# Patient Record
Sex: Male | Born: 1985 | Race: Black or African American | Hispanic: No | Marital: Married | Smoking: Current every day smoker
Health system: Southern US, Community
[De-identification: ages and names within clinical notes are randomized; demographics above are authoritative.]

## PROBLEM LIST (undated history)

## (undated) DIAGNOSIS — Z91199 Patient's noncompliance with other medical treatment and regimen due to unspecified reason: Secondary | ICD-10-CM

## (undated) DIAGNOSIS — R109 Unspecified abdominal pain: Secondary | ICD-10-CM

## (undated) DIAGNOSIS — F319 Bipolar disorder, unspecified: Secondary | ICD-10-CM

## (undated) DIAGNOSIS — Z9119 Patient's noncompliance with other medical treatment and regimen: Secondary | ICD-10-CM

## (undated) DIAGNOSIS — F1910 Other psychoactive substance abuse, uncomplicated: Secondary | ICD-10-CM

## (undated) DIAGNOSIS — G8929 Other chronic pain: Secondary | ICD-10-CM

## (undated) DIAGNOSIS — R569 Unspecified convulsions: Secondary | ICD-10-CM

## (undated) HISTORY — DX: Bipolar disorder, unspecified: F31.9

## (undated) HISTORY — PX: ESOPHAGOGASTRODUODENOSCOPY: SHX1529

---
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
## 2006-11-06 ENCOUNTER — Emergency Department (HOSPITAL_COMMUNITY): Admission: EM | Admit: 2006-11-06 | Discharge: 2006-11-06 | Payer: Self-pay | Admitting: Emergency Medicine

## 2006-11-09 ENCOUNTER — Emergency Department (HOSPITAL_COMMUNITY): Admission: EM | Admit: 2006-11-09 | Discharge: 2006-11-10 | Payer: Self-pay | Admitting: Emergency Medicine

## 2009-11-12 ENCOUNTER — Emergency Department (HOSPITAL_COMMUNITY): Admission: EM | Admit: 2009-11-12 | Discharge: 2009-11-12 | Payer: Self-pay | Admitting: Emergency Medicine

## 2010-02-13 ENCOUNTER — Emergency Department (HOSPITAL_COMMUNITY): Admission: EM | Admit: 2010-02-13 | Discharge: 2010-02-13 | Payer: Self-pay | Admitting: Emergency Medicine

## 2010-03-23 ENCOUNTER — Emergency Department (HOSPITAL_COMMUNITY): Admission: EM | Admit: 2010-03-23 | Discharge: 2010-03-23 | Payer: Self-pay | Admitting: Emergency Medicine

## 2010-05-04 ENCOUNTER — Emergency Department (HOSPITAL_COMMUNITY): Admission: EM | Admit: 2010-05-04 | Discharge: 2010-05-04 | Payer: Self-pay | Admitting: Emergency Medicine

## 2010-05-08 ENCOUNTER — Emergency Department (HOSPITAL_COMMUNITY): Admission: EM | Admit: 2010-05-08 | Discharge: 2010-05-08 | Payer: Self-pay | Admitting: Emergency Medicine

## 2010-05-27 ENCOUNTER — Emergency Department (HOSPITAL_COMMUNITY): Admission: EM | Admit: 2010-05-27 | Discharge: 2010-05-27 | Payer: Self-pay | Admitting: Family Medicine

## 2010-05-28 ENCOUNTER — Emergency Department (HOSPITAL_COMMUNITY): Admission: EM | Admit: 2010-05-28 | Discharge: 2010-05-28 | Payer: Self-pay | Admitting: Emergency Medicine

## 2010-05-29 ENCOUNTER — Emergency Department (HOSPITAL_COMMUNITY): Admission: EM | Admit: 2010-05-29 | Discharge: 2010-05-29 | Payer: Self-pay | Admitting: Emergency Medicine

## 2010-05-30 ENCOUNTER — Emergency Department (HOSPITAL_COMMUNITY): Admission: EM | Admit: 2010-05-30 | Discharge: 2010-05-30 | Payer: Self-pay | Admitting: Emergency Medicine

## 2010-06-05 ENCOUNTER — Emergency Department (HOSPITAL_COMMUNITY): Admission: EM | Admit: 2010-06-05 | Discharge: 2010-06-05 | Payer: Self-pay | Admitting: Emergency Medicine

## 2010-06-07 ENCOUNTER — Inpatient Hospital Stay (HOSPITAL_COMMUNITY): Admission: EM | Admit: 2010-06-07 | Discharge: 2010-06-08 | Payer: Self-pay | Admitting: Emergency Medicine

## 2010-06-13 ENCOUNTER — Emergency Department (HOSPITAL_COMMUNITY): Admission: EM | Admit: 2010-06-13 | Discharge: 2010-06-13 | Payer: Self-pay | Admitting: Emergency Medicine

## 2010-06-16 ENCOUNTER — Emergency Department (HOSPITAL_COMMUNITY): Admission: EM | Admit: 2010-06-16 | Discharge: 2010-06-16 | Payer: Self-pay | Admitting: Emergency Medicine

## 2010-07-11 ENCOUNTER — Emergency Department (HOSPITAL_COMMUNITY): Admission: EM | Admit: 2010-07-11 | Discharge: 2010-07-11 | Payer: Self-pay | Admitting: Emergency Medicine

## 2010-12-11 LAB — CBC
HCT: 44.5 % (ref 39.0–52.0)
HCT: 46.1 % (ref 39.0–52.0)
HCT: 42.8 % (ref 39.0–52.0)
HCT: 45.7 % (ref 39.0–52.0)
Hemoglobin: 14.4 g/dL (ref 13.0–17.0)
Hemoglobin: 14.6 g/dL (ref 13.0–17.0)
Hemoglobin: 15.1 g/dL (ref 13.0–17.0)
Hemoglobin: 15.6 g/dL (ref 13.0–17.0)
Hemoglobin: 15.7 g/dL (ref 13.0–17.0)
Hemoglobin: 16.1 g/dL (ref 13.0–17.0)
MCH: 31.9 pg (ref 26.0–34.0)
MCH: 32 pg (ref 26.0–34.0)
MCH: 32.4 pg (ref 26.0–34.0)
MCH: 32.4 pg (ref 26.0–34.0)
MCHC: 33.8 g/dL (ref 30.0–36.0)
MCHC: 33.9 g/dL (ref 30.0–36.0)
MCHC: 33.9 g/dL (ref 30.0–36.0)
MCHC: 34.1 g/dL (ref 30.0–36.0)
MCHC: 34.3 g/dL (ref 30.0–36.0)
MCHC: 34.3 g/dL (ref 30.0–36.0)
MCV: 94.2 fL (ref 78.0–100.0)
MCV: 94.4 fL (ref 78.0–100.0)
MCV: 95.1 fL (ref 78.0–100.0)
Platelets: 338 10*3/uL (ref 150–400)
Platelets: 340 10*3/uL (ref 150–400)
Platelets: 435 10*3/uL — ABNORMAL HIGH (ref 150–400)
RBC: 4.72 MIL/uL (ref 4.22–5.81)
RBC: 4.74 MIL/uL (ref 4.22–5.81)
RDW: 13 % (ref 11.5–15.5)
RDW: 12.9 % (ref 11.5–15.5)
RDW: 13.5 % (ref 11.5–15.5)
RDW: 13.5 % (ref 11.5–15.5)
RDW: 13.6 % (ref 11.5–15.5)
WBC: 10.6 10*3/uL — ABNORMAL HIGH (ref 4.0–10.5)
WBC: 12.9 10*3/uL — ABNORMAL HIGH (ref 4.0–10.5)
WBC: 17.5 10*3/uL — ABNORMAL HIGH (ref 4.0–10.5)
WBC: 25.5 10*3/uL — ABNORMAL HIGH (ref 4.0–10.5)

## 2010-12-11 LAB — URINALYSIS, ROUTINE W REFLEX MICROSCOPIC
Glucose, UA: NEGATIVE mg/dL
Glucose, UA: NEGATIVE mg/dL
Ketones, ur: 40 mg/dL — AB
Nitrite: NEGATIVE
Nitrite: NEGATIVE
Nitrite: NEGATIVE
Protein, ur: NEGATIVE mg/dL
Protein, ur: 30 mg/dL — AB
Specific Gravity, Urine: 1.035 — ABNORMAL HIGH (ref 1.005–1.030)
Urobilinogen, UA: 1 mg/dL (ref 0.0–1.0)
Urobilinogen, UA: 1 mg/dL (ref 0.0–1.0)
pH: 6.5 (ref 5.0–8.0)

## 2010-12-11 LAB — DIFFERENTIAL
Basophils Absolute: 0 10*3/uL (ref 0.0–0.1)
Basophils Absolute: 0 10*3/uL (ref 0.0–0.1)
Basophils Absolute: 0.1 10*3/uL (ref 0.0–0.1)
Basophils Relative: 0 % (ref 0–1)
Basophils Relative: 1 % (ref 0–1)
Basophils Relative: 0 % (ref 0–1)
Eosinophils Absolute: 0 10*3/uL (ref 0.0–0.7)
Eosinophils Absolute: 0 10*3/uL (ref 0.0–0.7)
Eosinophils Absolute: 0 10*3/uL (ref 0.0–0.7)
Eosinophils Relative: 0 % (ref 0–5)
Eosinophils Relative: 0 % (ref 0–5)
Eosinophils Relative: 1 % (ref 0–5)
Lymphocytes Relative: 12 % (ref 12–46)
Lymphocytes Relative: 19 % (ref 12–46)
Lymphocytes Relative: 4 % — ABNORMAL LOW (ref 12–46)
Lymphs Abs: 1.1 10*3/uL (ref 0.7–4.0)
Lymphs Abs: 1.2 10*3/uL (ref 0.7–4.0)
Lymphs Abs: 1.5 10*3/uL (ref 0.7–4.0)
Lymphs Abs: 2.2 10*3/uL (ref 0.7–4.0)
Monocytes Absolute: 0.6 10*3/uL (ref 0.1–1.0)
Monocytes Absolute: 0.6 10*3/uL (ref 0.1–1.0)
Monocytes Relative: 5 % (ref 3–12)
Monocytes Relative: 6 % (ref 3–12)
Monocytes Relative: 6 % (ref 3–12)
Monocytes Relative: 7 % (ref 3–12)
Neutro Abs: 23 10*3/uL — ABNORMAL HIGH (ref 1.7–7.7)
Neutro Abs: 8.1 10*3/uL — ABNORMAL HIGH (ref 1.7–7.7)
Neutro Abs: 8.3 10*3/uL — ABNORMAL HIGH (ref 1.7–7.7)
Neutro Abs: 8.9 10*3/uL — ABNORMAL HIGH (ref 1.7–7.7)
Neutro Abs: 9.7 10*3/uL — ABNORMAL HIGH (ref 1.7–7.7)
Neutrophils Relative %: 72 % (ref 43–77)
Neutrophils Relative %: 82 % — ABNORMAL HIGH (ref 43–77)
Neutrophils Relative %: 82 % — ABNORMAL HIGH (ref 43–77)
Neutrophils Relative %: 85 % — ABNORMAL HIGH (ref 43–77)
Neutrophils Relative %: 90 % — ABNORMAL HIGH (ref 43–77)

## 2010-12-11 LAB — POCT I-STAT, CHEM 8
BUN: 5 mg/dL — ABNORMAL LOW (ref 6–23)
Chloride: 97 mEq/L (ref 96–112)
Creatinine, Ser: 0.9 mg/dL (ref 0.4–1.5)
Glucose, Bld: 109 mg/dL — ABNORMAL HIGH (ref 70–99)
HCT: 52 % (ref 39.0–52.0)
Potassium: 3.3 mEq/L — ABNORMAL LOW (ref 3.5–5.1)
Potassium: 3.4 mEq/L — ABNORMAL LOW (ref 3.5–5.1)
Sodium: 143 mEq/L (ref 135–145)
Sodium: 139 mEq/L (ref 135–145)
TCO2: 30 mmol/L (ref 0–100)

## 2010-12-11 LAB — HEPATIC FUNCTION PANEL
Albumin: 3.7 g/dL (ref 3.5–5.2)
Alkaline Phosphatase: 64 U/L (ref 39–117)
Indirect Bilirubin: 0.5 mg/dL (ref 0.3–0.9)
Total Protein: 7.6 g/dL (ref 6.0–8.3)

## 2010-12-11 LAB — COMPREHENSIVE METABOLIC PANEL
ALT: 41 U/L (ref 0–53)
Albumin: 3.8 g/dL (ref 3.5–5.2)
Alkaline Phosphatase: 57 U/L (ref 39–117)
BUN: 7 mg/dL (ref 6–23)
BUN: 6 mg/dL (ref 6–23)
CO2: 27 mEq/L (ref 19–32)
Calcium: 9.7 mg/dL (ref 8.4–10.5)
Calcium: 9.2 mg/dL (ref 8.4–10.5)
Creatinine, Ser: 1.02 mg/dL (ref 0.4–1.5)
GFR calc Af Amer: >60 mL/min (ref >60)
GFR calc non Af Amer: >60 mL/min (ref >60)
GFR calc non Af Amer: >60 mL/min (ref >60)
Glucose, Bld: 100 mg/dL — ABNORMAL HIGH (ref 70–99)
Glucose, Bld: 103 mg/dL — ABNORMAL HIGH (ref 70–99)
Sodium: 141 mEq/L (ref 135–145)
Total Protein: 7.7 g/dL (ref 6.0–8.3)
Total Protein: 7.4 g/dL (ref 6.0–8.3)

## 2010-12-11 LAB — LIPASE, BLOOD
Lipase: 17 U/L (ref 11–59)
Lipase: 21 U/L (ref 11–59)

## 2010-12-11 LAB — RAPID URINE DRUG SCREEN, HOSP PERFORMED
Barbiturates: NOT DETECTED
Benzodiazepines: NOT DETECTED
Cocaine: NOT DETECTED
Opiates: NOT DETECTED

## 2010-12-11 LAB — GLUCOSE, CAPILLARY
Glucose-Capillary: 128 mg/dL — ABNORMAL HIGH (ref 70–99)
Glucose-Capillary: 106 mg/dL — ABNORMAL HIGH (ref 70–99)

## 2010-12-11 LAB — T4, FREE: Free T4: 1.23 ng/dL (ref 0.80–1.80)

## 2010-12-11 LAB — BASIC METABOLIC PANEL
Calcium: 8.9 mg/dL (ref 8.4–10.5)
Calcium: 9.4 mg/dL (ref 8.4–10.5)
Creatinine, Ser: 0.95 mg/dL (ref 0.4–1.5)
GFR calc Af Amer: >60 mL/min (ref >60)
GFR calc Af Amer: >60 mL/min (ref >60)
GFR calc non Af Amer: >60 mL/min (ref >60)
GFR calc non Af Amer: >60 mL/min (ref >60)
Glucose, Bld: 88 mg/dL (ref 70–99)
Sodium: 139 mEq/L (ref 135–145)
Sodium: 140 mEq/L (ref 135–145)

## 2010-12-11 LAB — MAGNESIUM: Magnesium: 1.8 mg/dL (ref 1.5–2.5)

## 2010-12-11 LAB — PHENYTOIN LEVEL, TOTAL
Phenytoin Lvl: 15.8 ug/mL (ref 10.0–20.0)
Phenytoin Lvl: <2.5 ug/mL — ABNORMAL LOW (ref 10.0–20.0)

## 2010-12-11 LAB — CULTURE, BLOOD (ROUTINE X 2)
Culture: NO GROWTH
Culture: NO GROWTH

## 2010-12-11 LAB — URINE MICROSCOPIC-ADD ON

## 2010-12-11 LAB — URINE CULTURE: Culture  Setup Time: 201109102021

## 2010-12-12 LAB — DIFFERENTIAL
Basophils Relative: 1 % (ref 0–1)
Monocytes Absolute: 1.3 10*3/uL — ABNORMAL HIGH (ref 0.1–1.0)
Neutro Abs: 9 10*3/uL — ABNORMAL HIGH (ref 1.7–7.7)
Neutrophils Relative %: 72 % (ref 43–77)

## 2010-12-12 LAB — CBC
HCT: 49.6 % (ref 39.0–52.0)
MCV: 93.1 fL (ref 78.0–100.0)
RBC: 5.33 MIL/uL (ref 4.22–5.81)
WBC: 12.5 10*3/uL — ABNORMAL HIGH (ref 4.0–10.5)

## 2010-12-12 LAB — URINALYSIS, ROUTINE W REFLEX MICROSCOPIC
Glucose, UA: NEGATIVE mg/dL
Protein, ur: NEGATIVE mg/dL
Specific Gravity, Urine: 1.026 (ref 1.005–1.030)
Urobilinogen, UA: 4 mg/dL — ABNORMAL HIGH (ref 0.0–1.0)

## 2010-12-12 LAB — URINE MICROSCOPIC-ADD ON

## 2010-12-12 LAB — POCT I-STAT, CHEM 8
BUN: 4 mg/dL — ABNORMAL LOW (ref 6–23)
Calcium, Ion: 1.16 mmol/L (ref 1.12–1.32)
Chloride: 106 mEq/L (ref 96–112)
HCT: 50 % (ref 39.0–52.0)
Hemoglobin: 17 g/dL (ref 13.0–17.0)
Sodium: 141 mEq/L (ref 135–145)
Sodium: 145 mEq/L (ref 135–145)
TCO2: 25 mmol/L (ref 0–100)

## 2010-12-12 LAB — COMPREHENSIVE METABOLIC PANEL
Alkaline Phosphatase: 74 U/L (ref 39–117)
BUN: 5 mg/dL — ABNORMAL LOW (ref 6–23)
Chloride: 106 mEq/L (ref 96–112)
Glucose, Bld: 82 mg/dL (ref 70–99)
Potassium: 3.7 mEq/L (ref 3.5–5.1)
Total Bilirubin: 0.7 mg/dL (ref 0.3–1.2)

## 2010-12-12 LAB — RAPID URINE DRUG SCREEN, HOSP PERFORMED
Amphetamines: NOT DETECTED
Cocaine: NOT DETECTED
Opiates: NOT DETECTED
Opiates: NOT DETECTED
Tetrahydrocannabinol: POSITIVE — AB
Tetrahydrocannabinol: POSITIVE — AB

## 2010-12-12 LAB — PHENYTOIN LEVEL, TOTAL: Phenytoin Lvl: 6 ug/mL — ABNORMAL LOW (ref 10.0–20.0)

## 2010-12-14 LAB — BASIC METABOLIC PANEL
Calcium: 9 mg/dL (ref 8.4–10.5)
Creatinine, Ser: 0.92 mg/dL (ref 0.4–1.5)
GFR calc Af Amer: >60 mL/min (ref >60)
GFR calc non Af Amer: >60 mL/min (ref >60)

## 2010-12-14 LAB — GLUCOSE, CAPILLARY: Glucose-Capillary: 115 mg/dL — ABNORMAL HIGH (ref 70–99)

## 2010-12-14 LAB — PHENYTOIN LEVEL, TOTAL: Phenytoin Lvl: <2.5 ug/mL — ABNORMAL LOW (ref 10.0–20.0)

## 2010-12-15 LAB — CBC
HCT: 45 % (ref 39.0–52.0)
MCHC: 33.3 g/dL (ref 30.0–36.0)
MCV: 96.7 fL (ref 78.0–100.0)
Platelets: 234 10*3/uL (ref 150–400)
RDW: 13.2 % (ref 11.5–15.5)
WBC: 7.8 10*3/uL (ref 4.0–10.5)

## 2010-12-15 LAB — COMPREHENSIVE METABOLIC PANEL
AST: 23 U/L (ref 0–37)
CO2: 24 mEq/L (ref 19–32)
Calcium: 9 mg/dL (ref 8.4–10.5)
Creatinine, Ser: 1.05 mg/dL (ref 0.4–1.5)
GFR calc Af Amer: >60 mL/min (ref >60)
GFR calc non Af Amer: >60 mL/min (ref >60)

## 2010-12-15 LAB — DIFFERENTIAL
Eosinophils Absolute: 0.4 10*3/uL (ref 0.0–0.7)
Eosinophils Relative: 6 % — ABNORMAL HIGH (ref 0–5)
Lymphs Abs: 2.6 10*3/uL (ref 0.7–4.0)

## 2010-12-17 LAB — BASIC METABOLIC PANEL
BUN: 9 mg/dL (ref 6–23)
GFR calc Af Amer: >60 mL/min (ref >60)
GFR calc non Af Amer: >60 mL/min (ref >60)
Potassium: 3.2 mEq/L — ABNORMAL LOW (ref 3.5–5.1)
Sodium: 139 mEq/L (ref 135–145)

## 2010-12-17 LAB — PHENYTOIN LEVEL, TOTAL: Phenytoin Lvl: <2.5 ug/mL — ABNORMAL LOW (ref 10.0–20.0)

## 2011-02-13 NOTE — Consult Note (Signed)
NAME:  Greg Morales NO.:  [id number]   MEDICAL RECORD NO.:  [id number]          PATIENT TYPE:  EMS   LOCATION:  [facility]                         FACILITY:  [facility]   PHYSICIAN:  Marlan Palau, M.D.  DATE OF BIRTH:  Mar 22, 1986   DATE OF CONSULTATION:  DATE OF DISCHARGE:                                 CONSULTATION   This is a neurology emergency room consult note on this patient done on  November 10, 2006.   HISTORY OF PRESENT ILLNESS:  Greg Morales is a 25 year old right-handed  black male born [birth year] with a history of seizures order since  childhood.  This patient has been on phenobarbital and Dilantin but  really was not taking these medications on a regular basis until he went  to jail about a year ago.  The patient has been on these medications on  a regular basis.  The patient was recently seen through the [facility]  emergency room on November 06, 2006.  The patient was felt to have had a  seizure around that time, and Dilantin and phenobarbital levels were  both within therapeutic range at that time.  Drug screen was positive  only for barbiturates.  Dilantin level was 14; phenobarbital level was  29.7.  The patient is on 300 mg of Dilantin in the evening and takes  phenobarbital 60 mg twice daily and 120 mg at night.  This evening, the  patient was noted to have some sort of a spell of falling out or loss of  consciousness.  Following the event, the patient was felt to possibly  have left-sided weakness and speech disturbance.  The patient was  brought to the emergency room for further evaluation.  The CT scan of  the brain done was unremarkable.  Speech pattern appears to be non-  organic with a monosyllabic hesitant speech type pattern.  Neurology was  asked see this patient as a code stroke.   PAST MEDICAL HISTORY:  Significant for:  1. History of syncope with non-organic speech pattern.  2. History of seizures on phenobarbital and  Dilantin.  3. History of hypothyroidism on medications.   The patient's current medications include:  1. Dilantin 300 mg every night.  2. Phenobarbital 60 mg b.i.d., 120 mg at night.  3. Synthroid 0.025 mg daily.   The patient smokes a pack of cigarettes a day.  Does not drink alcohol  currently but did drink prior to his incarceration.  Does use marijuana  prior to incarceration.   SOCIAL HISTORY:  The patient lives in the [place]  area.  Currently is incarcerated in jail for over a year.  Is not  married, has 2 children.  Had been working in temporary worked prior to  incarceration.   FAMILY MEDICAL HISTORY:  Notable that father is alive and mother is  alive, both in good health.  She has three siblings, some with asthma.  Maternal grandmother has history of seizures.  Otherwise no other family  members with seizures.   REVIEW OF SYSTEMS:  Notable for no recent fevers,  chills.  The patient  does note a headache.  Denies neck pain, chest pain, abdominal pain,  shortness of breath, cough.  Does note that the left foot is somewhat  numb, feels weak all over.   PHYSICAL EXAMINATION:  VITALS:  Blood pressure is 124/93, heart rate 81,  respiratory 23, temperature 100.1.  In general, this patient is a well-developed black male who is alert,  cooperative at the time examination.  HEENT EXAMINATION:  Is atraumatic.  EYES:  Pupils equal, round and  reactive to light.  Disks flat bilaterally. TONGUE:  Was without  lacerations.  NECK:  Supple.  No carotid bruits noted.  RESPIRATORY EXAMINATION:  Is clear.  CARDIOVASCULAR EXAMINATION:  Reveals a regular rate and rhythm.  No  obvious murmurs or rubs noted.  EXTREMITIES:  Without significant edema.  NEUROLOGIC EXAMINATION:  Cranial nerves as above.  Facial symmetry is  present.  Patient has good sensation in the face to pinprick, soft touch  bilaterally.  Has good strength in facial muscles, muscles in head  turning  and shoulder shrug bilaterally.  Speech is somewhat hesitant,  monosyllabic in nature, not true aphasia.  Motor testing reveals good  strength in all fours.  Good symmetric motor is noted throughout.  Sensory testing reveals some decrease in pinprick sensation on left  foot, decreased vibratory sensation of the left foot; otherwise  pinprick, soft touch, vibratory sensation were intact in all fours.  The  patient has good finger-nose-finger, toe-to-finger bilaterally.  Gait  was not tested.  Deep tendon reflexes again were symmetric and normal.  Toes neutral bilaterally.   LABORATORY VALUES:  At this time are pending.   CT of the head is as above.   IMPRESSION:  History of seizures disorder with apparent recurrence on  November 06, 2006.   The patient presents at this point as a code stroke with reported left-  sided weakness and speech disturbance.  No left-sided weakness is seen  at this point.  Speech disturbance appears to be non-organic in nature.  The patient had a questionable event of loss of consciousness this  evening.  The actual description of the event is not available to me.  I  suppose it is possibly he could have had a seizure event, but three days  ago, both phenobarbital and Dilantin levels were well within therapeutic  range.  Currently, the patient is demonstrating non-organic speech  behavior.  I do not suspect a stroke in this case.   PLAN:  Would check Dilantin and phenobarbital levels.  If these are well  within therapeutic range as they were three days ago, the patient may be  returned back to jail this evening.      Marlan Palau, M.D.  Electronically Signed     CKW/MEDQ  D:  11/10/2006  T:  11/10/2006  Job:  [id number]   cc:   [facility]

## 2011-04-01 IMAGING — CR DG ABDOMEN 2V
3 series · 3 of 3 positions shown · non-contrast
Comparison: None

CLINICAL DATA: Nausea, vomiting

ABDOMEN - 2 VIEW

[w abdomen decub * (1 of 2)]
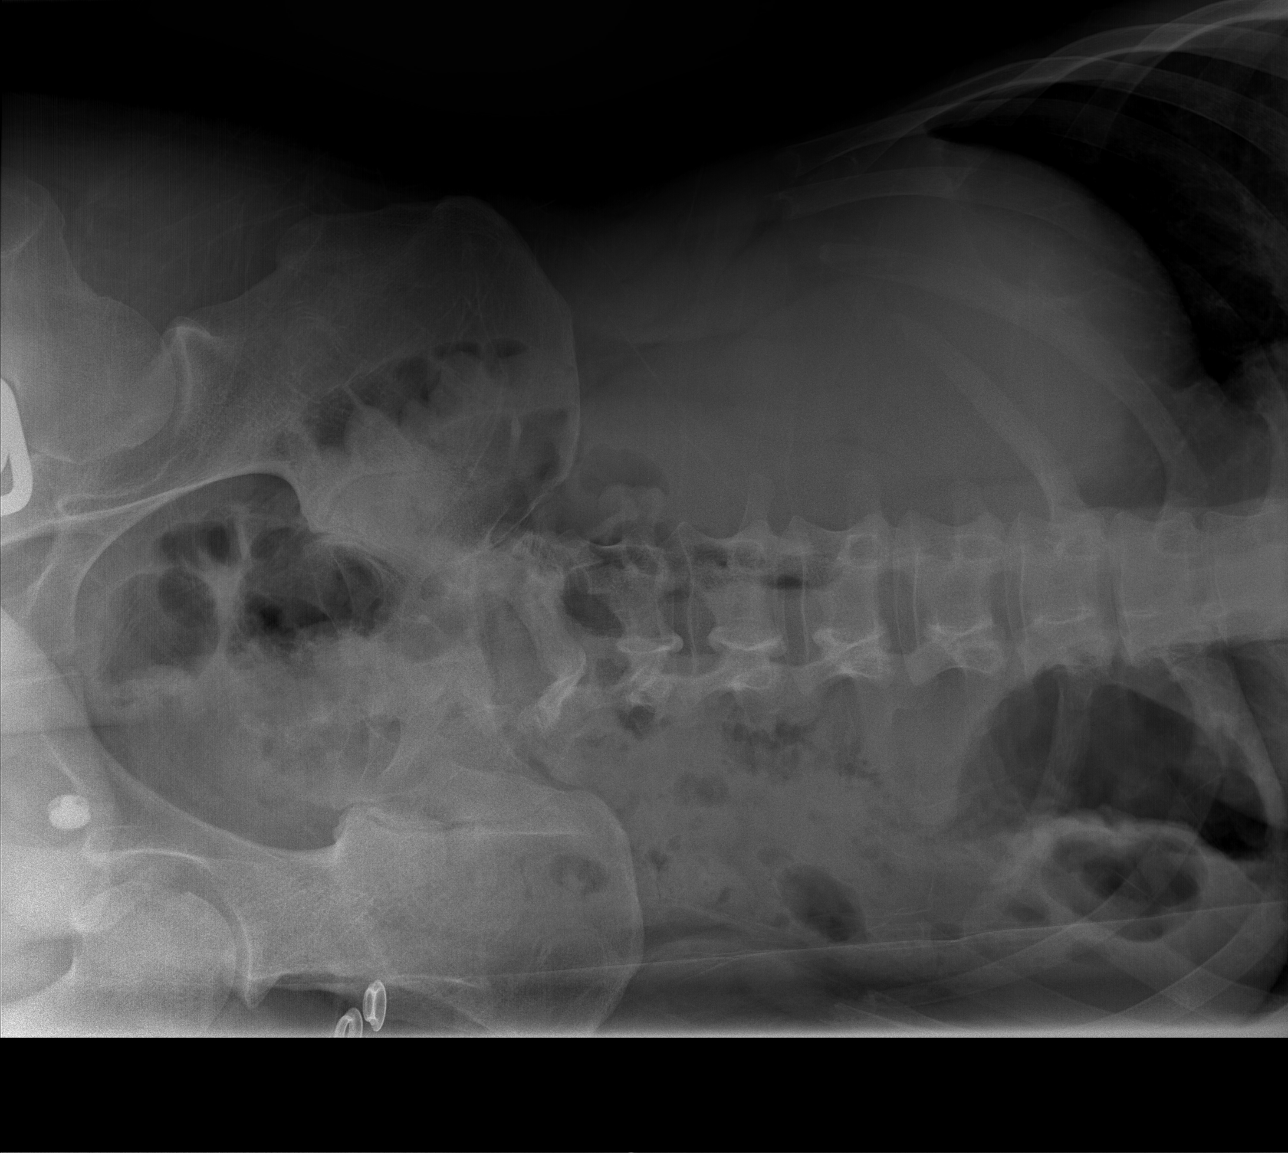

[w abdomen decub * (2 of 2)]
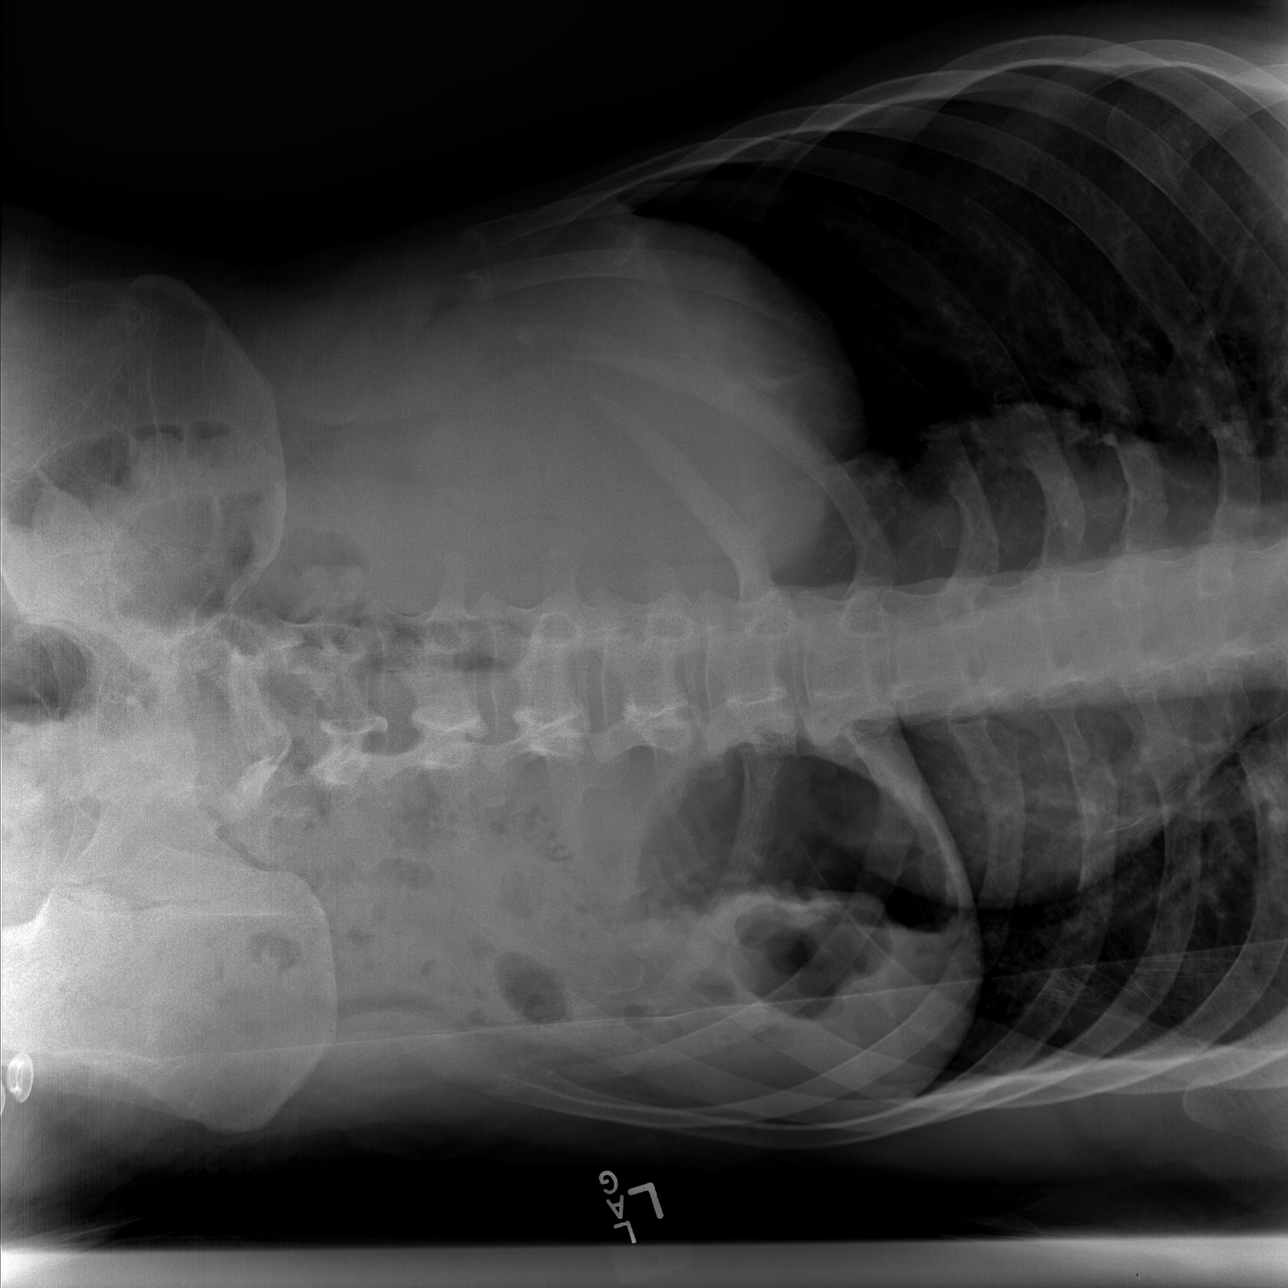

[t abdomen supine]
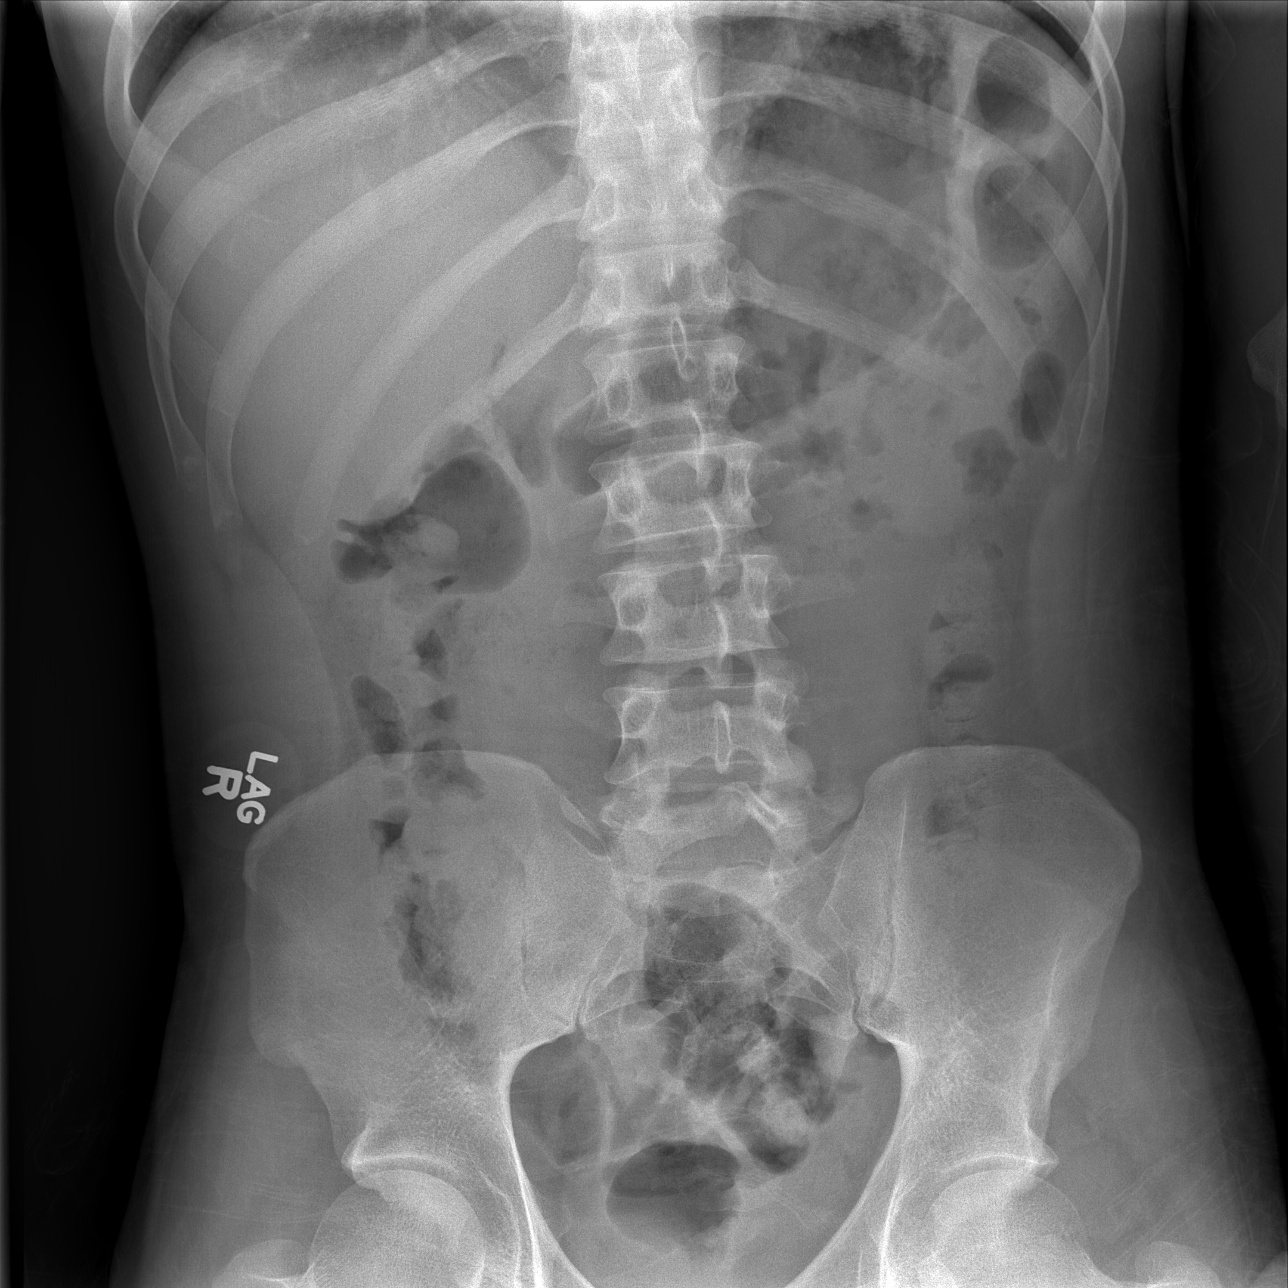

[3 of 3 positions shown; findings below may reference images not displayed]

FINDINGS: Normal bowel gas pattern.
No bowel dilatation or bowel wall thickening.
Bones unremarkable.
No urinary tract calcification or free intraperitoneal air.
IMPRESSION: Normal exam.

## 2011-04-29 IMAGING — US US SCROTUM
1 series · 14 of 25 positions shown · non-contrast
Comparison: None.

CLINICAL DATA: Scrotal pain and swelling - left greater than right

SCROTAL ULTRASOUND
DOPPLER ULTRASOUND OF THE TESTICLES
TECHNIQUE: Complete ultrasound examination of the testicles,
epididymis, and other scrotal structures was performed.  Color and
spectral Doppler ultrasound were also utilized to evaluate blood
flow to the testicles.

[Series 1: us scrotum · 0.08mm/px · 14 of 41 slices shown]
[im 1/41]
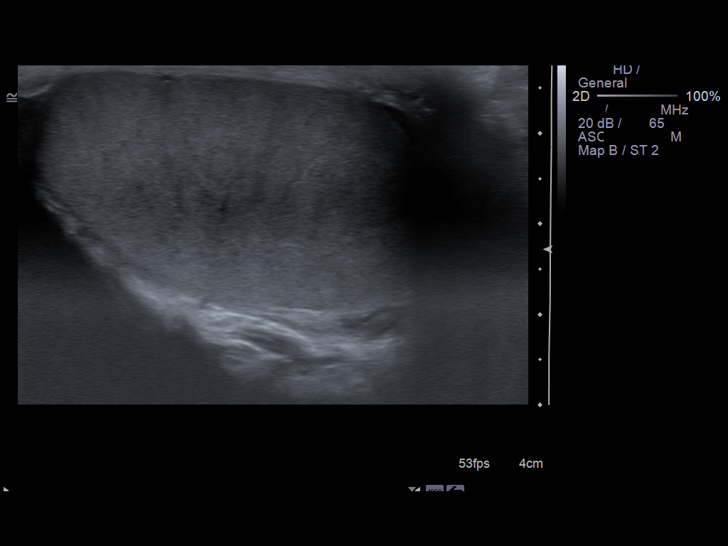
[im 4/41]
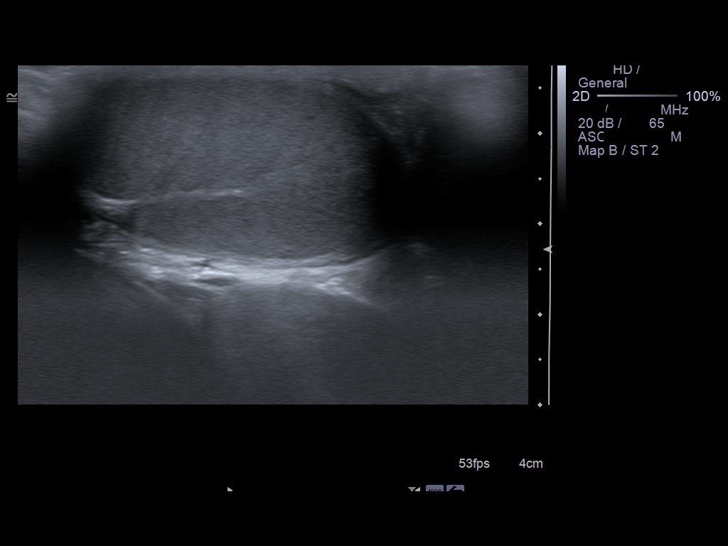
[im 7/41]
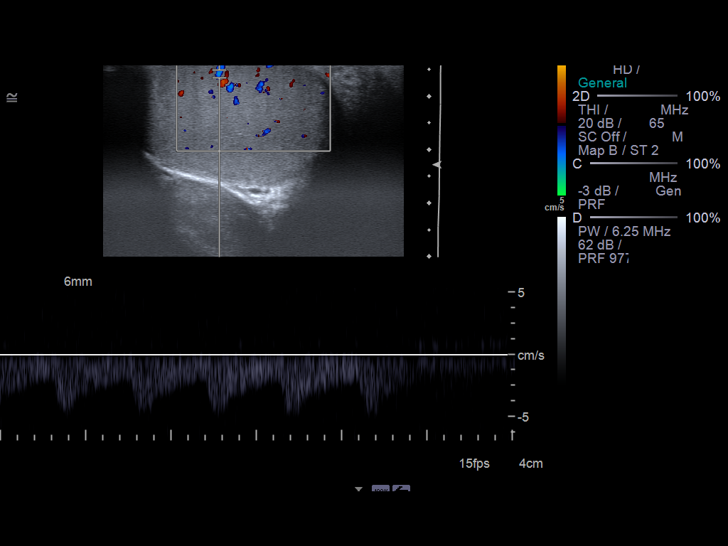
[im 11/41]
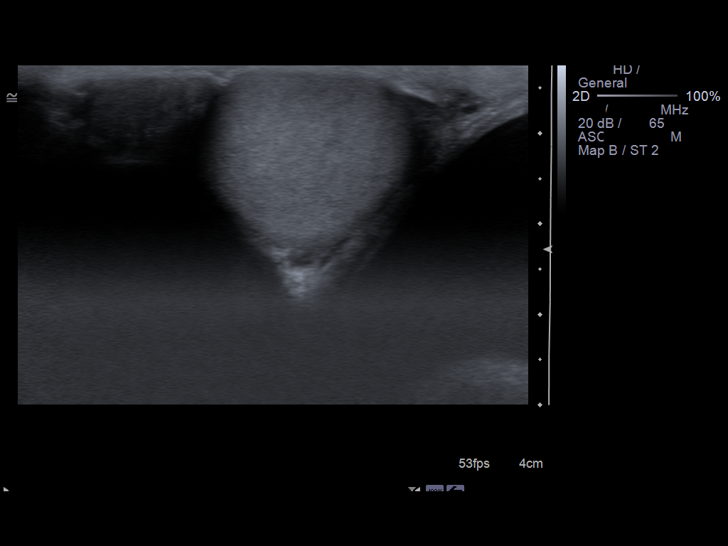
[im 14/41]
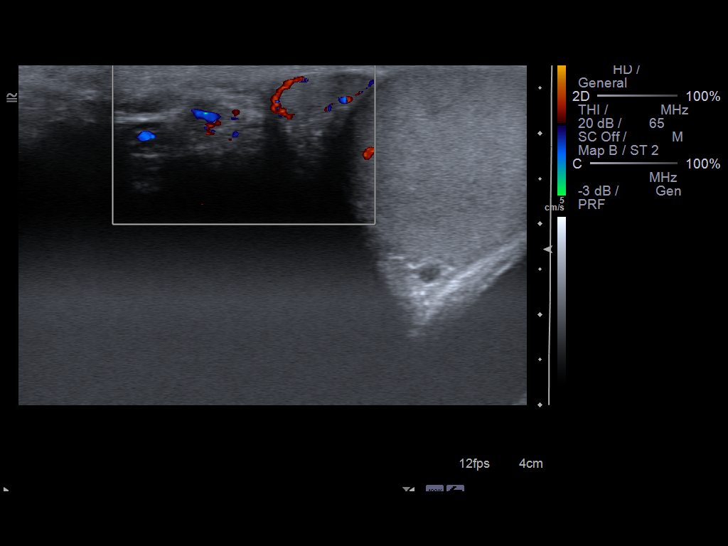
[im 16/41]
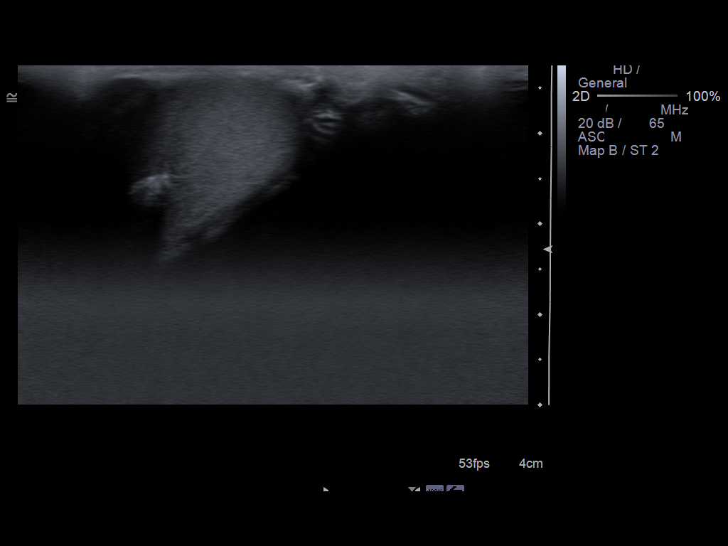
[im 19/41]
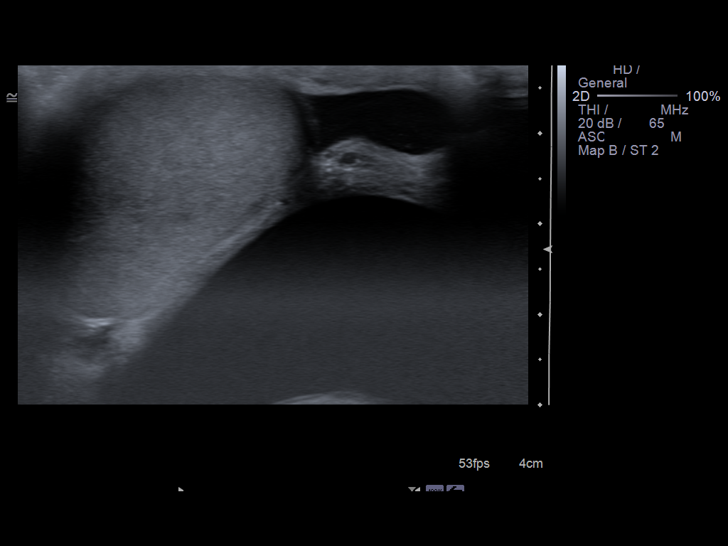
[im 22/41]
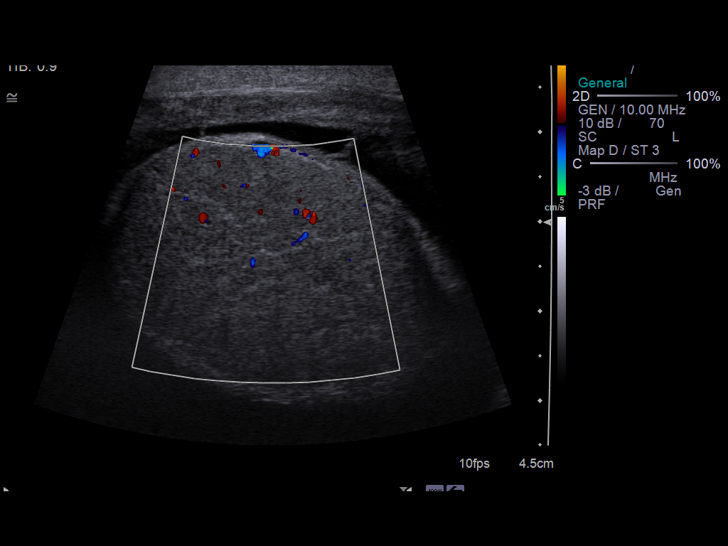
[im 26/41]
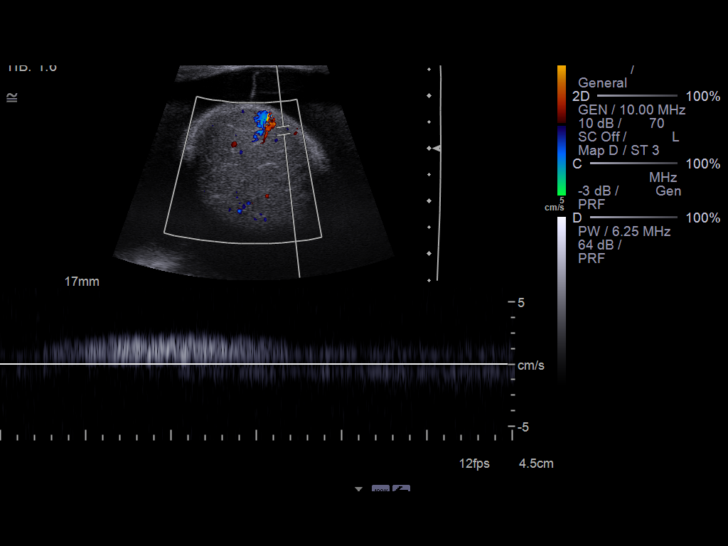
[im 27/41]
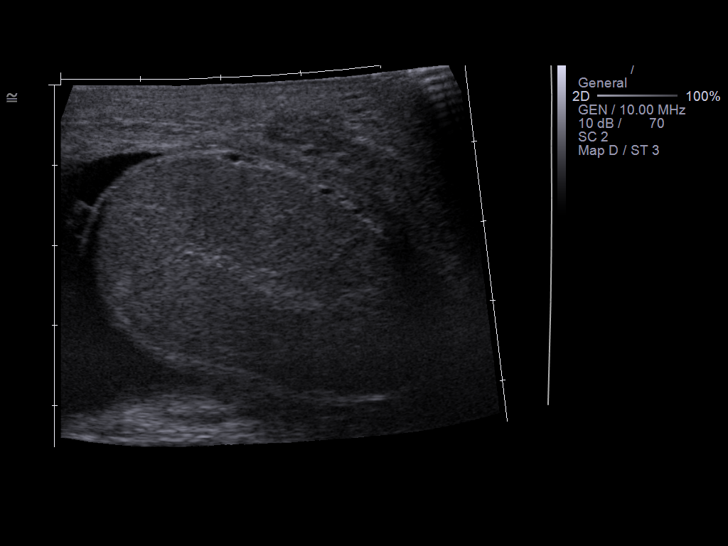
[im 31/41]
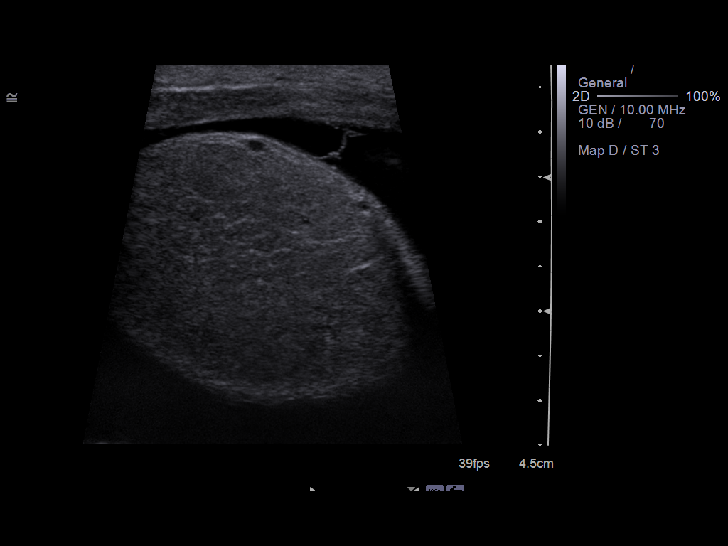
[im 34/41]
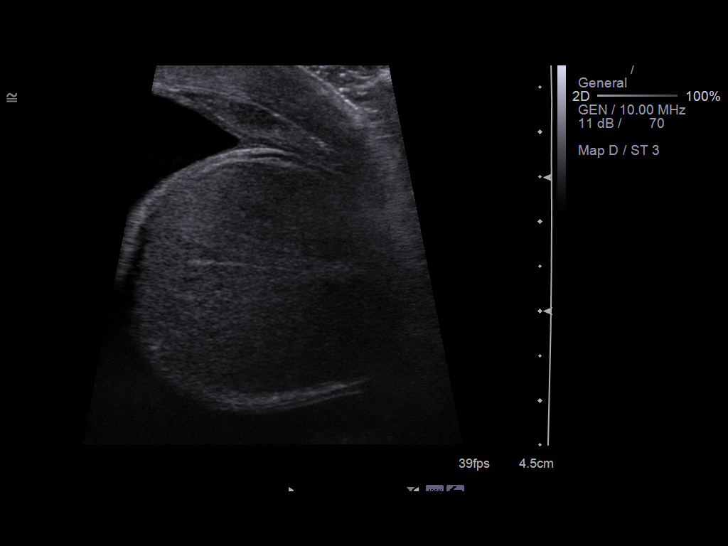
[im 37/41]
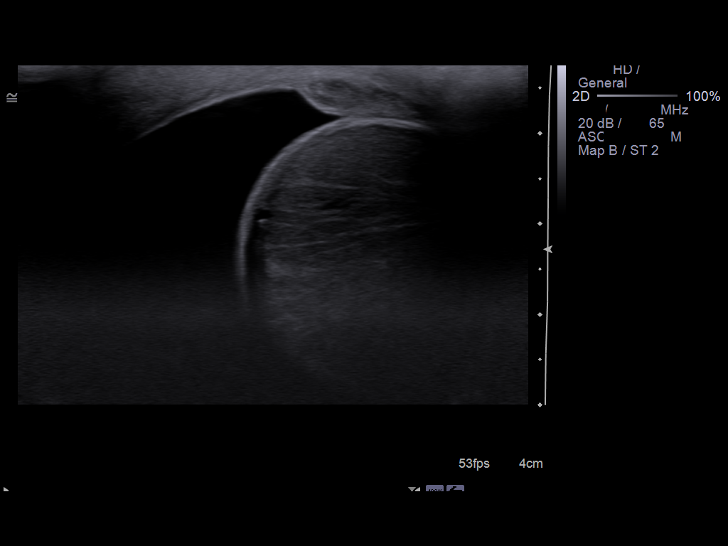
[im 41/41]
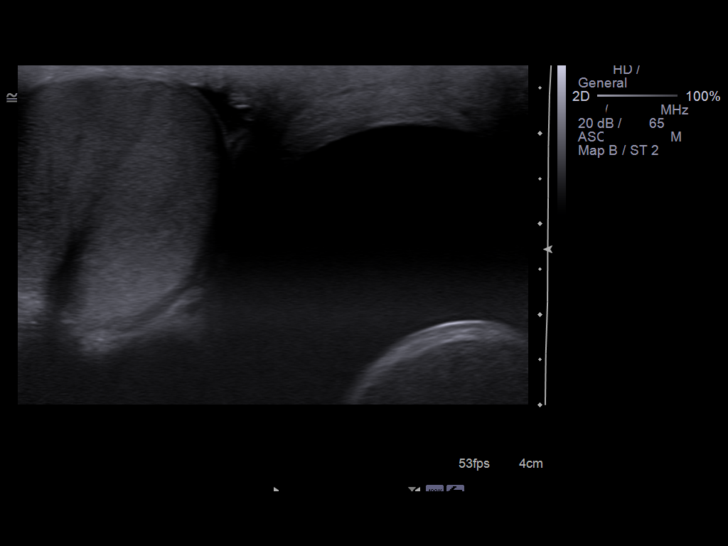

[14 of 25 positions shown; findings below may reference images not displayed]

FINDINGS: Both testicles are normal in size and contour without
lesions.  Symmetrical color flow Doppler and normal arterial and
venous wave [place] is noted.

No evidence for epididymitis.  There is a large hydrocele on the
left.  The  fluid appears uncomplicated.  No evidence for
varicocele on the right.  The large amount of left scrotal fluid
would not allow assessment for left varicocele.
IMPRESSION: 1.  Both testicles normal.
2.  No evidence for epididymitis.
3.  Large left hydrocele.

## 2011-12-23 ENCOUNTER — Emergency Department (HOSPITAL_COMMUNITY)
Admission: EM | Admit: 2011-12-23 | Discharge: 2011-12-23 | Disposition: A | Discharge status: Home / Self Care | Payer: Medicaid - Out of State | Attending: Emergency Medicine | Admitting: Emergency Medicine

## 2011-12-23 ENCOUNTER — Encounter (HOSPITAL_COMMUNITY): Payer: Self-pay | Admitting: Emergency Medicine

## 2011-12-23 DIAGNOSIS — R10819 Abdominal tenderness, unspecified site: Secondary | ICD-10-CM | POA: Insufficient documentation

## 2011-12-23 DIAGNOSIS — R61 Generalized hyperhidrosis: Secondary | ICD-10-CM | POA: Insufficient documentation

## 2011-12-23 DIAGNOSIS — R5381 Other malaise: Secondary | ICD-10-CM | POA: Insufficient documentation

## 2011-12-23 DIAGNOSIS — IMO0002 Reserved for concepts with insufficient information to code with codable children: Secondary | ICD-10-CM | POA: Insufficient documentation

## 2011-12-23 DIAGNOSIS — R569 Unspecified convulsions: Secondary | ICD-10-CM | POA: Insufficient documentation

## 2011-12-23 DIAGNOSIS — R259 Unspecified abnormal involuntary movements: Secondary | ICD-10-CM | POA: Insufficient documentation

## 2011-12-23 DIAGNOSIS — R112 Nausea with vomiting, unspecified: Secondary | ICD-10-CM | POA: Insufficient documentation

## 2011-12-23 DIAGNOSIS — R5383 Other fatigue: Secondary | ICD-10-CM | POA: Insufficient documentation

## 2011-12-23 DIAGNOSIS — R109 Unspecified abdominal pain: Secondary | ICD-10-CM | POA: Insufficient documentation

## 2011-12-23 HISTORY — DX: Unspecified convulsions: R56.9

## 2011-12-23 LAB — DIFFERENTIAL
Eosinophils Absolute: 0 10*3/uL (ref 0.0–0.7)
Eosinophils Relative: 0 % (ref 0–5)
Lymphocytes Relative: 8 % — ABNORMAL LOW (ref 12–46)
Lymphs Abs: 0.8 10*3/uL (ref 0.7–4.0)
Monocytes Absolute: 0.3 10*3/uL (ref 0.1–1.0)

## 2011-12-23 LAB — COMPREHENSIVE METABOLIC PANEL
CO2: 24 mEq/L (ref 19–32)
Calcium: 9.2 mg/dL (ref 8.4–10.5)
Creatinine, Ser: 0.94 mg/dL (ref 0.50–1.35)
GFR calc Af Amer: >90 mL/min (ref >90)
GFR calc non Af Amer: >90 mL/min (ref >90)
Glucose, Bld: 116 mg/dL — ABNORMAL HIGH (ref 70–99)
Sodium: 140 mEq/L (ref 135–145)
Total Protein: 7.1 g/dL (ref 6.0–8.3)

## 2011-12-23 LAB — RAPID URINE DRUG SCREEN, HOSP PERFORMED
Benzodiazepines: NOT DETECTED
Cocaine: NOT DETECTED
Opiates: NOT DETECTED

## 2011-12-23 LAB — CBC
Hemoglobin: 14.3 g/dL (ref 13.0–17.0)
MCHC: 33.4 g/dL (ref 30.0–36.0)

## 2011-12-23 LAB — LIPASE, BLOOD: Lipase: 29 U/L (ref 11–59)

## 2011-12-23 MED ORDER — FENTANYL CITRATE 0.05 MG/ML IJ SOLN
100.0000 ug | Freq: Once | INTRAMUSCULAR | Status: AC
  Administered 2011-12-23: 100 ug via INTRAVENOUS
  Filled 2011-12-23: qty 2

## 2011-12-23 MED ORDER — LEVETIRACETAM 500 MG PO TABS: 500.0000 mg | ORAL_TABLET | Freq: Two times a day (BID) | ORAL | Status: DC

## 2011-12-23 MED ORDER — SODIUM CHLORIDE 0.9 % IV SOLN
1000.0000 mg | Freq: Once | INTRAVENOUS | Status: AC
  Administered 2011-12-23: 1000 mg via INTRAVENOUS
  Filled 2011-12-23: qty 10

## 2011-12-23 MED ORDER — ONDANSETRON HCL 4 MG/2ML IJ SOLN
INTRAMUSCULAR | Status: AC
  Filled 2011-12-23: qty 2

## 2011-12-23 MED ORDER — ONDANSETRON 4 MG PO TBDP: 4.0000 mg | ORAL_TABLET | Freq: Three times a day (TID) | ORAL | Status: AC | PRN

## 2011-12-23 MED ORDER — SODIUM CHLORIDE 0.9 % IV SOLN
INTRAVENOUS | Status: DC
  Administered 2011-12-23: 21:00:00 via INTRAVENOUS
  Administered 2011-12-23: 1000 mL via INTRAVENOUS

## 2011-12-23 MED ORDER — ONDANSETRON HCL 4 MG/2ML IJ SOLN
4.0000 mg | Freq: Once | INTRAMUSCULAR | Status: AC
  Administered 2011-12-23: 4 mg via INTRAVENOUS

## 2011-12-23 NOTE — Discharge Instructions (Signed)
1. Please take all medications as prescribed.  2. If you have worsening of your symptoms or new symptoms arise, please go to the ER immediately if symptoms are severe.  Abdominal Pain Many things can cause belly (abdominal) pain. Most times, the belly pain is not dangerous. The amount of belly pain does not tell how serious the problem may be. Many cases of belly pain can be watched and treated at home. HOME CARE   Do not take medicines that help you go poop (laxatives) unless told to by your doctor.   Only take medicine as told by your doctor.   Eat or drink as told by your doctor. Your doctor will tell you if you should be on a special diet.  GET HELP RIGHT AWAY IF:   The pain does not go away.   You have a fever.   You keep throwing up (vomiting).   The pain changes and is only in the right or left part of the belly.   You have bloody or tarry looking poop.  MAKE SURE YOU:   Understand these instructions.   Will watch your condition.   Will get help right away if you are not doing well or get worse.  Document Released: 03/02/2008 Document Revised: 09/03/2011 Document Reviewed: 09/30/2009 [facility] Patient Information 2012 [place], [place].   Seizure, Adult A seizure is when the body shakes uncontrollably (convulsion). It can be a scary experience. A seizure is not a diagnosis. It is a sign that something else may be wrong with brain and/or spinal cord (central nervous system). In the Emergency Department, your condition is evaluated. The seizure is then treated. You will likely need follow-up with your caregiver. You will possibly need further testing and evaluation. Your caregiver or the specialist to whom you are referred will determine if further treatment is needed. After a seizure, you may be confused, dazed and drowsy. These problems (symptoms) often follow a seizure. Medication given to treat the seizure may also cause some of these changes. The time following a seizure  is known as a refractory period. Hospital admission is seldom required unless there are other conditions present such as trauma or metabolic problems. Sometimes the seizure activity follows a fainting episode. This may have been caused by a brief drop in blood pressure. These fainting (syncopal) seizures are generally not a cause for concern.  HOME CARE INSTRUCTIONS   Follow up with your caregiver as suggested.   If any problems happen, get help right away.   Do not swim or drive until your caregiver says it is okay.  Document Released: 09/11/2000 Document Revised: 09/03/2011 Document Reviewed: 09/02/2011 [facility] Patient Information 2012 [place], [place].

## 2011-12-23 NOTE — ED Notes (Addendum)
Pt given second ginger ale 

## 2011-12-23 NOTE — ED Notes (Signed)
Patient reports seizure like activity, nausea and vomiting and abdominal pain that started at 0300 am. Patient also reports sweating and chills.

## 2011-12-23 NOTE — ED Provider Notes (Signed)
History     CSN: [id number]  Arrival date & time 12/23/11  1932   First MD Initiated Contact with Patient 12/23/11 2010      Chief Complaint  Patient presents with  . Seizures  . Emesis    (Consider location/radiation/quality/duration/timing/severity/associated sxs/prior Treatment)  HPI Patient is 26 yo man with PMH of seizure disorder, who presents with abdominal pain and seizures. Patient only answers a few questions. History is provided by both patient and his cousin.   Patient came from [place] for visiting his mother and girl friend in [place] yesterday. He was living with her girl friend last night. At 3:00 AM, he started having severe abdominal pain. It is associated with nausea and vomiting. He vomited many times. Then he had seizure episodes for several times. He becomes very drowsy after each seizure episodes. Per his cousin, patient could not keep his medications down. He vomited his Keppra which he takes 500 mg twice daily.   Patient does not have diarrhea. He feels cold and is sweating a lot per his cousin.   Past Medical History  Diagnosis Date  . Seizures     History reviewed. No pertinent past surgical history.  History reviewed. No pertinent family history.  History  Substance Use Topics  . Smoking status: Former Smoker -- 0.5 packs/day for 5 years  . Smokeless tobacco: Not on file  . Alcohol Use: 0.6 oz/week    1 Cans of beer per week    Review of Systems  Constitutional: Positive for diaphoresis and fatigue. Negative for fever and chills.  HENT: Negative for hearing loss, ear pain, nosebleeds, congestion, sore throat, facial swelling, sneezing, drooling, mouth sores, neck pain, neck stiffness, voice change and ear discharge.   Eyes: Negative for pain, discharge and visual disturbance.  Respiratory: Negative for apnea, cough, choking, chest tightness, wheezing and stridor.   Cardiovascular: Negative for chest pain, palpitations and leg swelling.    Gastrointestinal: Positive for nausea, vomiting and abdominal pain. Negative for diarrhea, constipation, blood in stool, abdominal distention and rectal pain.  Genitourinary: Negative for dysuria, urgency, frequency and hematuria.  Musculoskeletal: Negative for back pain, joint swelling and gait problem.  Skin: Negative for rash and wound.  Neurological: Positive for tremors and seizures. Negative for dizziness, syncope, speech difficulty, light-headedness, numbness and headaches.  Hematological: Negative for adenopathy.  Psychiatric/Behavioral: Positive for confusion. Negative for hallucinations, behavioral problems and agitation.   Allergies  Review of patient's allergies indicates no known allergies.  Home Medications   Current Outpatient Rx  Name Route Sig Dispense Refill  . LEVETIRACETAM 500 MG PO TABS Oral Take 500 mg by mouth every 12 (twelve) hours.      BP 110/46  Pulse 80  Temp(Src) 98.7 F (37.1 C) (Oral)  Resp 14  SpO2 96%  Physical Exam  General: patient is very drowsy, does not cooperate with physical examination completely. HEENT: PERRL, no scleral icterus Cardiac: S1/S2, RRR, No murmurs, gallops or rubs Pulm: Good air movement bilaterally, Clear to auscultation bilaterally, No rales, wheezing, rhonchi or rubs. Abd: Soft,  Nondistended, tender diffusely, not sure whether patient has rebound pain due to non-cooperative to exam, no organomegaly, BS present Ext: No rashes or edema, 2+DP/PT pulse bilaterally Neuro: patient is drowsy. Difficult to assess mental status and cranial nerves, but he moves all extremities.   ED Course  Procedures (including critical care time)  Will give keppra 1000 mg by IV and Zofran for nausea.  Patient's lipase is normal. CMP  is normal except for glucose of 116. His ethanol level is less than 11. No leukocytosis. UDS is positive for TCH.   After IV fluid, and symptomatically treated for nausea and abdominal pain, patient's nausea  and abdominal pain resolved.  He was given 1000 mg Keppra by IV. He did not have new episode of seizure. Patient will be discharge home on Keppra 500 mg bid and Zofran for nausea.   Labs Reviewed  DIFFERENTIAL - Abnormal; Notable for the following:    Neutrophils Relative 89 (*)    Neutro Abs 8.8 (*)    Lymphocytes Relative 8 (*)    All other components within normal limits  COMPREHENSIVE METABOLIC PANEL - Abnormal; Notable for the following:    Glucose, Bld 116 (*)    All other components within normal limits  URINE RAPID DRUG SCREEN (HOSP PERFORMED) - Abnormal; Notable for the following:    Tetrahydrocannabinol POSITIVE (*)    All other components within normal limits  CBC  LIPASE, BLOOD  ETHANOL   No results found.   No diagnosis found.    MDM      Lorretta Harp, MD 12/23/11 2306

## 2011-12-24 NOTE — ED Provider Notes (Signed)
I saw and evaluated the patient, reviewed the resident's note and I agree with the findings and plan.   .Face to face Exam:  General:  Awake HEENT:  Atraumatic Resp:  Normal effort Abd:  Nondistended Neuro:No focal weakness Lymph: No adenopathy   After treatment in the ED the patient feels back to baseline and wants to go home.   Nelia Shi, MD 12/24/11 2350

## 2011-12-28 ENCOUNTER — Emergency Department (HOSPITAL_COMMUNITY)
Admission: EM | Admit: 2011-12-28 | Discharge: 2011-12-28 | Disposition: A | Discharge status: Home / Self Care | Payer: Medicaid - Out of State | Attending: Emergency Medicine | Admitting: Emergency Medicine

## 2011-12-28 ENCOUNTER — Encounter (HOSPITAL_COMMUNITY): Payer: Self-pay | Admitting: Emergency Medicine

## 2011-12-28 ENCOUNTER — Other Ambulatory Visit (HOSPITAL_COMMUNITY): Payer: Medicaid - Out of State

## 2011-12-28 ENCOUNTER — Other Ambulatory Visit: Payer: Self-pay

## 2011-12-28 DIAGNOSIS — R1110 Vomiting, unspecified: Secondary | ICD-10-CM | POA: Insufficient documentation

## 2011-12-28 DIAGNOSIS — G40909 Epilepsy, unspecified, not intractable, without status epilepticus: Secondary | ICD-10-CM

## 2011-12-28 DIAGNOSIS — R109 Unspecified abdominal pain: Secondary | ICD-10-CM | POA: Insufficient documentation

## 2011-12-28 LAB — COMPREHENSIVE METABOLIC PANEL
ALT: 20 U/L (ref 0–53)
Alkaline Phosphatase: 46 U/L (ref 39–117)
BUN: 7 mg/dL (ref 6–23)
CO2: 29 mEq/L (ref 19–32)
Calcium: 9.3 mg/dL (ref 8.4–10.5)
GFR calc Af Amer: >90 mL/min (ref >90)
GFR calc non Af Amer: >90 mL/min (ref >90)
Glucose, Bld: 107 mg/dL — ABNORMAL HIGH (ref 70–99)
Potassium: 3.1 mEq/L — ABNORMAL LOW (ref 3.5–5.1)
Sodium: 144 mEq/L (ref 135–145)

## 2011-12-28 LAB — URINALYSIS, ROUTINE W REFLEX MICROSCOPIC
Bilirubin Urine: NEGATIVE
Glucose, UA: NEGATIVE mg/dL
Hgb urine dipstick: NEGATIVE
Specific Gravity, Urine: 1.023 (ref 1.005–1.030)
Urobilinogen, UA: 0.2 mg/dL (ref 0.0–1.0)
pH: 7.5 (ref 5.0–8.0)

## 2011-12-28 LAB — CBC
Hemoglobin: 14.6 g/dL (ref 13.0–17.0)
MCV: 91.8 fL (ref 78.0–100.0)
Platelets: 257 10*3/uL (ref 150–400)
RBC: 4.65 MIL/uL (ref 4.22–5.81)
WBC: 6.8 10*3/uL (ref 4.0–10.5)

## 2011-12-28 LAB — DIFFERENTIAL
Eosinophils Relative: 0 % (ref 0–5)
Lymphocytes Relative: 14 % (ref 12–46)
Lymphs Abs: 0.9 10*3/uL (ref 0.7–4.0)
Monocytes Relative: 3 % (ref 3–12)

## 2011-12-28 MED ORDER — ONDANSETRON 4 MG PO TBDP: 4.0000 mg | ORAL_TABLET | Freq: Three times a day (TID) | ORAL | Status: AC | PRN

## 2011-12-28 MED ORDER — ONDANSETRON HCL 4 MG/2ML IJ SOLN
INTRAMUSCULAR | Status: AC
  Administered 2011-12-28: 13:00:00
  Filled 2011-12-28: qty 2

## 2011-12-28 MED ORDER — SODIUM CHLORIDE 0.9 % IV BOLUS (SEPSIS)
1000.0000 mL | Freq: Once | INTRAVENOUS | Status: AC
  Administered 2011-12-28: 1000 mL via INTRAVENOUS

## 2011-12-28 MED ORDER — ONDANSETRON HCL 4 MG/2ML IJ SOLN
4.0000 mg | Freq: Once | INTRAMUSCULAR | Status: AC
  Administered 2011-12-28: 4 mg via INTRAVENOUS
  Filled 2011-12-28: qty 2

## 2011-12-28 MED ORDER — LEVETIRACETAM 500 MG/5ML IV SOLN
500.0000 mg | Freq: Once | INTRAVENOUS | Status: AC
  Administered 2011-12-28: 500 mg via INTRAVENOUS
  Filled 2011-12-28: qty 5

## 2011-12-28 MED ORDER — HYDROMORPHONE HCL PF 1 MG/ML IJ SOLN
1.0000 mg | Freq: Once | INTRAMUSCULAR | Status: AC
  Administered 2011-12-28: 1 mg via INTRAVENOUS
  Filled 2011-12-28: qty 1

## 2011-12-28 MED ORDER — MORPHINE SULFATE 4 MG/ML IJ SOLN
4.0000 mg | Freq: Once | INTRAMUSCULAR | Status: AC
  Administered 2011-12-28: 4 mg via INTRAVENOUS
  Filled 2011-12-28: qty 1

## 2011-12-28 MED ORDER — POTASSIUM CHLORIDE 20 MEQ/15ML (10%) PO LIQD
40.0000 meq | Freq: Once | ORAL | Status: AC
  Administered 2011-12-28: 40 meq via ORAL
  Filled 2011-12-28: qty 30

## 2011-12-28 MED ORDER — ONDANSETRON 4 MG PO TBDP
4.0000 mg | ORAL_TABLET | Freq: Once | ORAL | Status: AC
  Administered 2011-12-28: 4 mg via ORAL
  Filled 2011-12-28: qty 1

## 2011-12-28 NOTE — ED Notes (Signed)
Pt sts he waiting on family member to bring him some clothing. Pt offered paper scrubs to change into while he waits on family. Pt refused, stating he was "not going anywhere until he gets some real clothes." Diane, Charge RN made aware about delay of discharge. Sts to give family 30 minutes to arrive and then pt would have to leave.

## 2011-12-28 NOTE — ED Notes (Addendum)
Pt reports taking pain medication this am for abd pain but sts he threw it back up. Sts he received this prescription for percocet from [facility] ED when he was evaluated there for abd pain. Pt sts pain 10/10 and moaning and restless when RN/staff is in room. However when passing by room, entering room, pt is asleep, VSS.

## 2011-12-28 NOTE — ED Notes (Signed)
AOZ:[id number]<VH> Expected date:<BR> Expected time:12:04 PM<BR> Means of arrival:Ambulance<BR> Comments:<BR> M35 -- Seizures;N/V

## 2011-12-28 NOTE — ED Notes (Signed)
Pt gave ginger ale for PO challenge.

## 2011-12-28 NOTE — ED Notes (Signed)
Pt. C/o of n/v x 1 week/abd pain and 2 witnessed seizures this morning. H/o epilepsy. No tongue trauma no incont noted. No other injuries reported. zofran 4mg  IV given en route. 20 G L hand.

## 2011-12-28 NOTE — ED Provider Notes (Signed)
History     CSN: [id number]  Arrival date & time 12/28/11  1210   First MD Initiated Contact with Patient 12/28/11 1306      Chief Complaint  Patient presents with  . Seizures  . Abdominal Pain    (Consider location/radiation/quality/duration/timing/severity/associated sxs/prior treatment) HPI  26yoM h/o , chronic intermittent abdominal pain of unknown etiology, vomiting seizure, vomiting, vomiting. The patient states he takes 500 mg of Keppra twice a day. He states that for the past one week he has experience diffuse abdominal pain with multiple episodes of nonbilious nonbloody emesis. He continues to vomit in the emergency department. He states he was able to tolerate his Keppra yesterday I was not able to tolerate it today. He was witnessed by family members having seizures at home just prior to arrival. Patient cannot give clear history regarding this and the urine emergency department. He complains of 10 out of 10 diffuse abdominal pain. He denies pain in his genitals. Denies back pain. Denies hematuria/dysuria/freq/urgency. Mild lightheadedness. Denies headache. Denies fevers, chills.  Seen at [facility] for same 3/28   Past Medical History  Diagnosis Date  . Seizures     History reviewed. No pertinent past surgical history.  History reviewed. No pertinent family history.  History  Substance Use Topics  . Smoking status: Former Smoker -- 0.5 packs/day for 5 years  . Smokeless tobacco: Not on file  . Alcohol Use: 0.6 oz/week    1 Cans of beer per week    Review of Systems  All other systems reviewed and are negative.   except as noted HPI   Allergies  Review of patient's allergies indicates no known allergies.  Home Medications   Current Outpatient Rx  Name Route Sig Dispense Refill  . LEVETIRACETAM 500 MG PO TABS Oral Take 1 tablet (500 mg total) by mouth 2 (two) times daily. 60 tablet 3  . ONDANSETRON 4 MG PO TBDP Oral Take 1 tablet (4 mg total) by mouth every 8  (eight) hours as needed for nausea. 24 tablet 0  . ONDANSETRON 4 MG PO TBDP Oral Take 1 tablet (4 mg total) by mouth every 8 (eight) hours as needed for nausea. 12 tablet 0    BP 121/65  Pulse 56  Temp(Src) 98.6 F (37 C) (Oral)  Resp 11  SpO2 100%  Physical Exam  Nursing note and vitals reviewed. Constitutional: He is oriented to person, place, and time. He appears well-developed and well-nourished. No distress.  HENT:  Head: Atraumatic.       Mm dry  Eyes: Conjunctivae are normal. Pupils are equal, round, and reactive to light.  Neck: Neck supple.  Cardiovascular: Normal rate, regular rhythm, normal heart sounds and intact distal pulses.  Exam reveals no gallop and no friction rub.   No murmur heard. Pulmonary/Chest: Effort normal. No respiratory distress. He has no wheezes. He has no rales.  Abdominal: Soft. Bowel sounds are normal. There is tenderness. There is no rebound and no guarding.       Diffuse abd ttp > lower abd No r/g  Musculoskeletal: Normal range of motion. He exhibits no edema and no tenderness.  Neurological: He is alert and oriented to person, place, and time.  Skin: Skin is warm and dry.  Psychiatric: He has a normal mood and affect.    ED Course  Procedures (including critical care time)  Labs Reviewed  URINALYSIS, ROUTINE W REFLEX MICROSCOPIC - Abnormal; Notable for the following:    APPearance HAZY (*)  Ketones, ur TRACE (*)    All other components within normal limits  DIFFERENTIAL - Abnormal; Notable for the following:    Neutrophils Relative 83 (*)    All other components within normal limits  COMPREHENSIVE METABOLIC PANEL - Abnormal; Notable for the following:    Potassium 3.1 (*)    Glucose, Bld 107 (*)    All other components within normal limits  CBC  LIPASE, BLOOD  LEVETIRACETAM LEVEL   No results found.   1. Seizure disorder   2. Abdominal pain     MDM   Seizure likely 2/2 dehydration, medication non compliance, decreased  sleep-- all 2/2 abdominal pain/n/v. Has been seen here multiple in ED for abd pain N/V. Last CT A/P several months ago. Will order CT today to reassess. Hydrate, IVF, pain control.   Allergies.the patient had a CT abdomen and pelvis at [facility] on 12/23/2001 for similar symptoms. CT abdomen pelvis with contrast without acute abnormality identified (given hard copy of report). This includes a normal appendix. Labs reviewed and unremarkable. The patient is tolerating small sips of ginger ale without vomiting but minimal nausea. Zofran ODT ordered. He has not had a seizure the emergency department. He has been to EDs multiple times for same sx without acute cause found. He states that he has refills of his Keppra. He was given a bolus of keppra in the emergency department and will continue to take his home medications. Stable for discharge with outpatient followup. Patient given precautions for return to the emergency department.      Forbes Cellar, MD 12/28/11 [phone]

## 2011-12-28 NOTE — Discharge Instructions (Signed)
Abdominal Pain Abdominal pain can be caused by many things. Your caregiver decides the seriousness of your pain by an examination and possibly blood tests and X-rays. Many cases can be observed and treated at home. Most abdominal pain is not caused by a disease and will probably improve without treatment. However, in many cases, more time must pass before a clear cause of the pain can be found. Before that point, it may not be known if you need more testing, or if hospitalization or surgery is needed. HOME CARE INSTRUCTIONS   Do not take laxatives unless directed by your caregiver.   Take pain medicine only as directed by your caregiver.   Only take over-the-counter or prescription medicines for pain, discomfort, or fever as directed by your caregiver.   Try a clear liquid diet (broth, tea, or water) for as long as directed by your caregiver. Slowly move to a bland diet as tolerated.  SEEK IMMEDIATE MEDICAL CARE IF:   The pain does not go away.   You have a fever.   You keep throwing up (vomiting).   The pain is felt only in portions of the abdomen. Pain in the right side could possibly be appendicitis. In an adult, pain in the left lower portion of the abdomen could be colitis or diverticulitis.   You pass bloody or black tarry stools.  MAKE SURE YOU:   Understand these instructions.   Will watch your condition.   Will get help right away if you are not doing well or get worse.  Document Released: 06/24/2005 Document Revised: 09/03/2011 Document Reviewed: 05/02/2008 [facility] Patient Information 2012 [place], [place].  Epilepsy A seizure (convulsion) is a sudden change in brain function that causes a change in behavior, muscle activity, or ability to remain awake and alert. If a person has recurring seizures, this is called epilepsy. CAUSES  Epilepsy is a disorder with many possible causes. Anything that disturbs the normal pattern of brain cell activity can lead to seizures.  Seizure can be caused from illness to brain damage to abnormal brain development. Epilepsy may develop because of:  An abnormality in brain wiring.   An imbalance of nerve signaling chemicals (neurotransmitters).   Some combination of these factors.  Scientists are learning an increasing amount about genetic causes of seizures. SYMPTOMS  The symptoms of a seizure can vary greatly from one person to another. These may include:  An aura, or warning that tells a person they are about to have a seizure.   Abnormal sensations, such as abnormal smell or seeing flashing lights.   Sudden, general body stiffness.   Rhythmic jerking of the face, arm, or leg - on one or both sides.   Sudden change in consciousness.   The person may appear to be awake but not responding.   They may appear to be asleep but cannot be awakened.   Grimacing, chewing, lip smacking, or drooling.   Often there is a period of sleepiness after a seizure.  DIAGNOSIS  The description you give to your caregiver about what you experienced will help them understand your problems. Equally important is the description by any witnesses to your seizure. A physical exam, including a detailed neurological exam, is necessary. An EEG (electroencephalogram) is a painless test of your brain waves. In this test a diagram is created of your brain waves. These diagrams can be interpreted by a specialist. Pictures of your brain are usually taken with:  An MRI.   A CT scan.  Lab tests may be done to look for:  Signs of infection.   Abnormal blood chemistry.  PREVENTION  There is no way to prevent the development of epilepsy. If you have seizures that are typically triggered by an event (such as flashing lights), try to avoid the trigger. This can help you avoid a seizure.  PROGNOSIS  Most people with epilepsy lead outwardly normal lives. While epilepsy cannot currently be cured, for some people it does eventually go away. Most  seizures do not cause brain damage. It is not uncommon for people with epilepsy, especially children, to develop behavioral and emotional problems. These problems are sometimes the consequence of medicine for seizures or social stress. For some people with epilepsy, the risk of seizures restricts their independence and recreational activities. For example, some states refuse drivers licenses to people with epilepsy. Most women with epilepsy can become pregnant. They should discuss their epilepsy and the medicine they are taking with their caregivers. Women with epilepsy have a 90 percent or better chance of having a normal, healthy baby. RISKS AND COMPLICATIONS  People with epilepsy are at increased risk of falls, accidents, and injuries. People with epilepsy are at special risk for two life-threatening conditions. These are status epilepticus and sudden unexplained death (extremely rare). Status epilepticus is a long lasting, continuous seizure that is a medical emergency. TREATMENT  Once epilepsy is diagnosed, it is important to begin treatment as soon as possible. For about 80 percent of those diagnosed with epilepsy, seizures can be controlled with modern medicines and surgical techniques. Some antiepileptic drugs can interfere with the effectiveness of oral contraceptives. In 1997, the FDA approved a pacemaker for the brain the (vagus nerve stimulator). This stimulator can be used for people with seizures that are not well-controlled by medicine. Studies have shown that in some cases, children may experience fewer seizures if they maintain a strict diet. The strict diet is called the ketogenic diet. This diet is rich in fats and low in carbohydrates. HOME CARE INSTRUCTIONS   Your caregiver will make recommendations about driving and safety in normal activities. Follow these carefully.   Take any medicine prescribed exactly as directed.   Do any blood tests requested to monitor the levels of your  medicine.   The people you live and work with should know that you are prone to seizures. They should receive instructions on how to help you. In general, a witness to a seizure should:   Cushion your head and body.   Turn you on your side.   Avoid unnecessarily restraining you.   Not place anything inside your mouth.   Call for local emergency medical help if there is any question about what has occurred.   Keep a seizure diary. Record what you recall about any seizure, especially any possible trigger.   If your caregiver has given you a follow-up appointment, it is very important to keep that appointment. Not keeping the appointment could result in permanent injury and disability. If there is any problem keeping the appointment, you must call back to this facility for assistance.  SEEK MEDICAL CARE IF:   You develop signs of infection or other illness. This might increase the risk of a seizure.   You seem to be having more frequent seizures.   Your seizure pattern is changing.  SEEK IMMEDIATE MEDICAL CARE IF:   A seizure does not stop after a few moments.   A seizure causes any difficulty in breathing.   A seizure  results in a very severe headache.   A seizure leaves you with the inability to speak or use a part of your body.  MAKE SURE YOU:   Understand these instructions.   Will watch your condition.   Will get help right away if you are not doing well or get worse.  Document Released: 09/14/2005 Document Revised: 09/03/2011 Document Reviewed: 04/20/2008 [facility] Patient Information 2012 [place], [place].  RESOURCE GUIDE  Dental Problems  Patients with Medicaid: [facility] [phone] [address]                                           [phone] [address] Phone:  [phone]                                                   Phone:  [phone]  If unable to pay or uninsured, contact:  [facility] or [facility]. to become qualified for the adult dental clinic.  Chronic Pain Problems Contact [facility]  [phone] Patients need to be referred by their primary care doctor.  Insufficient Money for Medicine Contact [employer] Way:  call "211" or [facility] [phone].  No Primary Care Doctor Call [facility]  [phone] Other agencies that provide inexpensive medical care    [facility]  [phone]    [facility]  [phone]    [facility]  [phone]    [facility]  [phone]    [facility]  [phone]    [facility]  [phone]  Psychological Services [facility]  [phone] [facility]  [phone] [facility]   [phone] (emergency services [phone])  Abuse/Neglect [facility] Child Abuse Hotline 208-229-6397 [facility] Child Abuse Hotline [phone] (After Hours)  Emergency Shelter [facility] [phone]  Maternity Homes [facility] [phone] Alert Services [phone]  MRSA Hotline #:   (902) 036-3212    [facility] Resources  [facility]  [employer]                           [facility] Dept. [address] [place]                     [address]         [address]  [place]                                               Cristobal Goldmann Phone:  [phone]  Phone:  [phone]                   Phone:  [place] Phone:  [place] [phone] (After Hours)

## 2011-12-31 ENCOUNTER — Encounter (HOSPITAL_COMMUNITY): Payer: Self-pay | Admitting: Emergency Medicine

## 2011-12-31 ENCOUNTER — Emergency Department (HOSPITAL_COMMUNITY)
Admission: EM | Admit: 2011-12-31 | Discharge: 2011-12-31 | Disposition: A | Discharge status: Home / Self Care | Payer: Self-pay | Attending: Emergency Medicine | Admitting: Emergency Medicine

## 2011-12-31 DIAGNOSIS — R109 Unspecified abdominal pain: Secondary | ICD-10-CM

## 2011-12-31 DIAGNOSIS — G40909 Epilepsy, unspecified, not intractable, without status epilepticus: Secondary | ICD-10-CM | POA: Insufficient documentation

## 2011-12-31 DIAGNOSIS — R630 Anorexia: Secondary | ICD-10-CM | POA: Insufficient documentation

## 2011-12-31 DIAGNOSIS — R1084 Generalized abdominal pain: Secondary | ICD-10-CM | POA: Insufficient documentation

## 2011-12-31 DIAGNOSIS — G8929 Other chronic pain: Secondary | ICD-10-CM | POA: Insufficient documentation

## 2011-12-31 DIAGNOSIS — R1110 Vomiting, unspecified: Secondary | ICD-10-CM

## 2011-12-31 DIAGNOSIS — Z79899 Other long term (current) drug therapy: Secondary | ICD-10-CM | POA: Insufficient documentation

## 2011-12-31 DIAGNOSIS — R112 Nausea with vomiting, unspecified: Secondary | ICD-10-CM | POA: Insufficient documentation

## 2011-12-31 LAB — CBC
HCT: 45.5 % (ref 39.0–52.0)
Hemoglobin: 15.7 g/dL (ref 13.0–17.0)
WBC: 9.9 10*3/uL (ref 4.0–10.5)

## 2011-12-31 LAB — DIFFERENTIAL
Lymphocytes Relative: 24 % (ref 12–46)
Monocytes Absolute: 0.7 10*3/uL (ref 0.1–1.0)
Monocytes Relative: 7 % (ref 3–12)
Neutro Abs: 6.7 10*3/uL (ref 1.7–7.7)

## 2011-12-31 LAB — COMPREHENSIVE METABOLIC PANEL
ALT: 18 U/L (ref 0–53)
BUN: 5 mg/dL — ABNORMAL LOW (ref 6–23)
Calcium: 9.3 mg/dL (ref 8.4–10.5)
GFR calc Af Amer: >90 mL/min (ref >90)
Glucose, Bld: 102 mg/dL — ABNORMAL HIGH (ref 70–99)
Sodium: 141 mEq/L (ref 135–145)
Total Protein: 7.3 g/dL (ref 6.0–8.3)

## 2011-12-31 MED ORDER — MORPHINE SULFATE 4 MG/ML IJ SOLN
4.0000 mg | Freq: Once | INTRAMUSCULAR | Status: AC
  Administered 2011-12-31: 4 mg via INTRAVENOUS
  Filled 2011-12-31: qty 1

## 2011-12-31 MED ORDER — PROMETHAZINE HCL 25 MG RE SUPP: 25.0000 mg | Freq: Four times a day (QID) | RECTAL | Status: DC | PRN

## 2011-12-31 MED ORDER — POTASSIUM CHLORIDE CRYS ER 20 MEQ PO TBCR
40.0000 meq | EXTENDED_RELEASE_TABLET | Freq: Once | ORAL | Status: AC
  Administered 2011-12-31: 40 meq via ORAL
  Filled 2011-12-31: qty 2

## 2011-12-31 MED ORDER — SODIUM CHLORIDE 0.9 % IV BOLUS (SEPSIS)
1000.0000 mL | Freq: Once | INTRAVENOUS | Status: AC
  Administered 2011-12-31: 1000 mL via INTRAVENOUS

## 2011-12-31 MED ORDER — PROMETHAZINE HCL 25 MG/ML IJ SOLN
25.0000 mg | Freq: Once | INTRAMUSCULAR | Status: AC
  Administered 2011-12-31: 25 mg via INTRAVENOUS
  Filled 2011-12-31: qty 1

## 2011-12-31 MED ORDER — SODIUM CHLORIDE 0.9 % IV SOLN
500.0000 mg | Freq: Two times a day (BID) | INTRAVENOUS | Status: DC
  Administered 2011-12-31: 500 mg via INTRAVENOUS
  Filled 2011-12-31 (×3): qty 5

## 2011-12-31 MED ORDER — ONDANSETRON HCL 4 MG/2ML IJ SOLN
4.0000 mg | INTRAMUSCULAR | Status: DC | PRN
  Administered 2011-12-31: 4 mg via INTRAVENOUS
  Filled 2011-12-31: qty 2

## 2011-12-31 MED ORDER — MORPHINE SULFATE 4 MG/ML IJ SOLN
8.0000 mg | Freq: Once | INTRAMUSCULAR | Status: AC
  Administered 2011-12-31: 8 mg via INTRAVENOUS
  Filled 2011-12-31: qty 2
  Filled 2011-12-31: qty 1

## 2011-12-31 MED ORDER — OXYCODONE-ACETAMINOPHEN 5-325 MG PO TABS: 1.0000 | ORAL_TABLET | Freq: Four times a day (QID) | ORAL | Status: AC | PRN

## 2011-12-31 NOTE — Discharge Instructions (Signed)
Use Phenergan suppositories for nausea and vomiting. Follow up with the Gastroenterologist or general surgeon listed above for further evaluation of your abdominal pain. Only use your pain medication for severe pain. Do not operate heavy machinery while on pain medication or muscle relaxer. Note that your pain medication contains acetaminophen (Tylenol) & its is not reccommended that you use additional acetaminophen (Tylenol) while taking this medication.   Abdominal Pain  Your exam might not show the exact reason you have abdominal pain. Since there are many different causes of abdominal pain, another checkup and more tests may be needed. It is very important to follow up for lasting (persistent) or worsening symptoms. A possible cause of abdominal pain in any person who still has his or her appendix is acute appendicitis. Appendicitis is often hard to diagnose. Normal blood tests, urine tests, ultrasound, and CT scans do not completely rule out early appendicitis or other causes of abdominal pain. Sometimes, only the changes that happen over time will allow appendicitis and other causes of abdominal pain to be determined. Other potential problems that may require surgery may also take time to become more apparent. Because of this, it is important that you follow all of the instructions below.   HOME CARE INSTRUCTIONS  Do not take laxatives unless directed by your caregiver. Rest as much as possible.  Do not eat solid food until your pain is gone: A diet of water, weak decaffeinated tea, broth or bouillon, gelatin, oral rehydration solutions (ORS), frozen ice pops, or ice chips may be helpful.  When pain is gone: Start a light diet (dry toast, crackers, applesauce, or white rice). Increase the diet slowly as long as it does not bother you. Eat no dairy products (including cheese and eggs) and no spicy, fatty, fried, or high-fiber foods.  Use no alcohol, caffeine, or cigarettes.  Take your regular  medicines unless your caregiver told you not to.  Take any prescribed medicine as directed.   SEEK IMMEDIATE MEDICAL CARE IF:  The pain does not go away.  You have a fever >101 that persists You keep throwing up (vomiting) or cannot drink liquids.  The pain becomes localized (Pain in the right side could possibly be appendicitis. In an adult, pain in the left lower portion of the abdomen could be colitis or diverticulitis). You pass bloody or black tarry stools.  You have shaking chills.  There is blood in your vomit or you see blood in your bowel movements.  Your bowel movements stop (become blocked) or you cannot pass gas.  You have bloody, frequent, or painful urination.  You have yellow discoloration in the skin or whites of the eyes.  Your stomach becomes bloated or bigger.  You have dizziness or fainting.  You have chest or back pain.

## 2011-12-31 NOTE — ED Notes (Signed)
Pt woke up when I came back in the room. Still having pain. Nausea is better. Pt resting at this time.

## 2011-12-31 NOTE — ED Provider Notes (Signed)
History/physical exam/procedure(s) were performed by non-physician practitioner and as supervising physician I was immediately available for consultation/collaboration. I have reviewed all notes and am in agreement with care and plan.   Hilario Quarry, MD 12/31/11 [phone]

## 2011-12-31 NOTE — ED Provider Notes (Signed)
History     CSN: [id number]  Arrival date & time 12/31/11  5784   First MD Initiated Contact with Patient 12/31/11 1008      No chief complaint on file.   (Consider location/radiation/quality/duration/timing/severity/associated sxs/prior treatment) HPI Comments: Patient with a history of epilepsy and chronic abdominal pain presents emergency department chief complaint of seizures, nausea, vomiting, and abdominal pain.  Patient has been seen for similar complaints on April 1 and March 28.  Patient had an abdominal CT with contrast performed at [facility] on 3/28 that showed no acute findings.  Patient has a history of diffuse abdominal pain for years onset in 2011.  Patient has been followed by a gastroenterologist in [place], no etiology discovered.  History obtained from patient and his patient is unable to answer questions due to pain.  Symptoms have been worsening over the last week, pain is diffuse, severe 10 out of 10, and does not radiate.  Symptoms not relieved by Zofran and Percocet.  Patient unable to keep down into the epileptic medication or any food.  Patient denies any back pain, genital pain, hematuria, hematemesis, melena, fevers, night sweats, chills.  Last normal bowel movement was this morning.  The history is provided by the patient.    Past Medical History  Diagnosis Date  . Seizures     History reviewed. No pertinent past surgical history.  No family history on file.  History  Substance Use Topics  . Smoking status: Former Smoker -- 0.5 packs/day for 5 years  . Smokeless tobacco: Not on file  . Alcohol Use: 0.6 oz/week    1 Cans of beer per week      Review of Systems  Constitutional: Negative for fever, chills and appetite change.  HENT: Negative for congestion.   Eyes: Negative for visual disturbance.  Respiratory: Negative for shortness of breath.   Cardiovascular: Negative for chest pain and leg swelling.  Gastrointestinal: Positive for  nausea, vomiting and abdominal pain.  Genitourinary: Negative for dysuria, urgency and frequency.  Neurological: Negative for dizziness, syncope, weakness, light-headedness, numbness and headaches.  Psychiatric/Behavioral: Negative for confusion.  All other systems reviewed and are negative.    Allergies  Review of patient's allergies indicates no known allergies.  Home Medications   Current Outpatient Rx  Name Route Sig Dispense Refill  . LEVETIRACETAM 500 MG PO TABS Oral Take 1 tablet (500 mg total) by mouth 2 (two) times daily. 60 tablet 3  . ONDANSETRON 4 MG PO TBDP Oral Take 1 tablet (4 mg total) by mouth every 8 (eight) hours as needed for nausea. 24 tablet 0  . ONDANSETRON 4 MG PO TBDP Oral Take 1 tablet (4 mg total) by mouth every 8 (eight) hours as needed for nausea. 12 tablet 0    BP 149/95  Pulse 60  Temp(Src) 98.1 F (36.7 C) (Oral)  Resp 22  SpO2 100%  Physical Exam  Nursing note and vitals reviewed. Constitutional: Vital signs are normal. He appears well-developed and well-nourished. No distress.  HENT:  Head: Normocephalic and atraumatic.  Mouth/Throat: Uvula is midline, oropharynx is clear and moist and mucous membranes are normal.  Eyes: Conjunctivae and EOM are normal. Pupils are equal, round, and reactive to light.  Neck: Normal range of motion and full passive range of motion without pain. Neck supple. No spinous process tenderness and no muscular tenderness present. No rigidity. No Brudzinski's sign noted.  Cardiovascular: Normal rate and regular rhythm.   Pulmonary/Chest: Effort normal and  breath sounds normal. No accessory muscle usage. Not tachypneic. No respiratory distress.  Abdominal: Soft. Normal appearance. He exhibits no distension, no ascites, no pulsatile midline mass and no mass. There is generalized tenderness. There is no CVA tenderness. No hernia.    Lymphadenopathy:    He has no cervical adenopathy.  Neurological: He is alert.  Skin:  Skin is warm and dry. No rash noted. He is not diaphoretic.  Psychiatric: He has a normal mood and affect. His speech is normal and behavior is normal.    ED Course  Procedures (including critical care time)  Labs Reviewed  COMPREHENSIVE METABOLIC PANEL - Abnormal; Notable for the following:    Potassium 3.1 (*)    Glucose, Bld 102 (*)    BUN 5 (*)    GFR calc non Af Amer 89 (*)    All other components within normal limits  CBC  DIFFERENTIAL   No results found.   No diagnosis found.    MDM  N/V, abd pain  Patient seen in emergency department for chronic abdominal pain and vomiting.  Patient has been suffering from similar symptoms since 2011 and has been seen in this emergency department multiple times.  Patient's pain has been managed in the emergency department and he has been able to tolerate greater than 6 ounces of oral fluids.  CT imaging is not indicated because of recent study on the 28th showed no evidence of acute abdominal findings.  Labs within normal limits vital signs stable and patient in no acute distress prior to discharge.  Patient advised to followup with his GI doctor.  Patient will be given Phenergan suppositories upon discharge and pain medication.        Jaci Carrel, [place] 12/31/11 1306

## 2011-12-31 NOTE — ED Notes (Signed)
Given Ginger Ale for fluid challenge.  

## 2011-12-31 NOTE — ED Notes (Signed)
Pt angry about being discharged and stated " if I have to come back in here im blanking on all y'all."

## 2011-12-31 NOTE — ED Notes (Signed)
Pt refused PO potassium due to nausea. EDP notified.

## 2011-12-31 NOTE — ED Notes (Signed)
Pt c/o of seizures and nausea and vomiting for several days. Pt is c/o of right lower abdominal pain. Pt is talking in short sentences and able to answer questions. Pt roomed. On monitor. Suction at bedside.

## 2012-02-05 ENCOUNTER — Encounter (HOSPITAL_COMMUNITY): Payer: Self-pay | Admitting: Emergency Medicine

## 2012-02-05 ENCOUNTER — Emergency Department (HOSPITAL_COMMUNITY)
Admission: EM | Admit: 2012-02-05 | Discharge: 2012-02-05 | Disposition: A | Discharge status: Home / Self Care | Payer: Self-pay | Attending: Emergency Medicine | Admitting: Emergency Medicine

## 2012-02-05 ENCOUNTER — Emergency Department (HOSPITAL_COMMUNITY): Payer: Self-pay

## 2012-02-05 DIAGNOSIS — G40909 Epilepsy, unspecified, not intractable, without status epilepticus: Secondary | ICD-10-CM

## 2012-02-05 DIAGNOSIS — R05 Cough: Secondary | ICD-10-CM | POA: Insufficient documentation

## 2012-02-05 DIAGNOSIS — R569 Unspecified convulsions: Secondary | ICD-10-CM | POA: Insufficient documentation

## 2012-02-05 DIAGNOSIS — IMO0001 Reserved for inherently not codable concepts without codable children: Secondary | ICD-10-CM | POA: Insufficient documentation

## 2012-02-05 DIAGNOSIS — R5600 Simple febrile convulsions: Secondary | ICD-10-CM | POA: Insufficient documentation

## 2012-02-05 DIAGNOSIS — R059 Cough, unspecified: Secondary | ICD-10-CM | POA: Insufficient documentation

## 2012-02-05 DIAGNOSIS — B349 Viral infection, unspecified: Secondary | ICD-10-CM

## 2012-02-05 DIAGNOSIS — B9789 Other viral agents as the cause of diseases classified elsewhere: Secondary | ICD-10-CM | POA: Insufficient documentation

## 2012-02-05 DIAGNOSIS — R110 Nausea: Secondary | ICD-10-CM | POA: Insufficient documentation

## 2012-02-05 LAB — DIFFERENTIAL
Basophils Absolute: 0 10*3/uL (ref 0.0–0.1)
Basophils Relative: 0 % (ref 0–1)
Eosinophils Absolute: 0.1 10*3/uL (ref 0.0–0.7)
Eosinophils Relative: 2 % (ref 0–5)
Lymphocytes Relative: 15 % (ref 12–46)
Lymphs Abs: 1.2 10*3/uL (ref 0.7–4.0)
Monocytes Absolute: 0.5 10*3/uL (ref 0.1–1.0)
Monocytes Relative: 6 % (ref 3–12)
Neutro Abs: 6.3 10*3/uL (ref 1.7–7.7)
Neutrophils Relative %: 78 % — ABNORMAL HIGH (ref 43–77)

## 2012-02-05 LAB — CBC
HCT: 45.7 % (ref 39.0–52.0)
Hemoglobin: 15.6 g/dL (ref 13.0–17.0)
MCH: 31.4 pg (ref 26.0–34.0)
MCHC: 34.1 g/dL (ref 30.0–36.0)
MCV: 92 fL (ref 78.0–100.0)
Platelets: 236 10*3/uL (ref 150–400)
RBC: 4.97 MIL/uL (ref 4.22–5.81)
RDW: 13.8 % (ref 11.5–15.5)
WBC: 8.1 10*3/uL (ref 4.0–10.5)

## 2012-02-05 LAB — BASIC METABOLIC PANEL
BUN: 10 mg/dL (ref 6–23)
Chloride: 105 mEq/L (ref 96–112)
Creatinine, Ser: 0.89 mg/dL (ref 0.50–1.35)
GFR calc Af Amer: >90 mL/min (ref >90)

## 2012-02-05 MED ORDER — LORAZEPAM 2 MG/ML IJ SOLN
1.0000 mg | Freq: Once | INTRAMUSCULAR | Status: AC
  Administered 2012-02-05: 1 mg via INTRAVENOUS
  Filled 2012-02-05: qty 1

## 2012-02-05 NOTE — ED Provider Notes (Signed)
History     CSN: [id number]  Arrival date & time 02/05/12  1241   First MD Initiated Contact with Patient 02/05/12 1305      Chief Complaint  Patient presents with  . Fever  . Influenza    (Consider location/radiation/quality/duration/timing/severity/associated sxs/prior treatment) Patient is a 26 y.o. male presenting with fever. The history is provided by the patient and a friend.  Fever Primary symptoms of the febrile illness include fever, cough, nausea and myalgias. Primary symptoms do not include abdominal pain, vomiting, dysuria or rash.  He has been having myalgias for 2 days, nausea, feels too ill to take medications but without vomiting. He reports no Keppra for seizures since yesterday morning. He also reports having more frequent seizures this this illness.  Past Medical History  Diagnosis Date  . Seizures     History reviewed. No pertinent past surgical history.  History reviewed. No pertinent family history.  History  Substance Use Topics  . Smoking status: Former Smoker -- 0.5 packs/day for 5 years  . Smokeless tobacco: Not on file  . Alcohol Use: 0.6 oz/week    1 Cans of beer per week      Review of Systems  Constitutional: Positive for fever.  HENT: Positive for congestion.   Respiratory: Positive for cough.   Gastrointestinal: Positive for nausea. Negative for vomiting and abdominal pain.  Genitourinary: Negative for dysuria.  Musculoskeletal: Positive for myalgias.  Skin: Negative for rash.  Neurological: Positive for seizures.    Allergies  Review of patient's allergies indicates no known allergies.  Home Medications   Current Outpatient Rx  Name Route Sig Dispense Refill  . LEVETIRACETAM 500 MG PO TABS Oral Take 1 tablet (500 mg total) by mouth 2 (two) times daily. 60 tablet 3    BP 124/83  Pulse 77  Temp(Src) 98.4 F (36.9 C) (Oral)  Resp 22  SpO2 98%  Physical Exam  Constitutional: He is oriented to person, place, and time. He  appears well-developed and well-nourished.  HENT:  Mouth/Throat: Oropharynx is clear and moist.  Neck: Normal range of motion.  Cardiovascular: Normal rate.   No murmur heard. Pulmonary/Chest: Effort normal. He has no wheezes. He has no rales.  Abdominal: Soft. There is no tenderness.  Musculoskeletal: Normal range of motion.  Neurological: He is alert and oriented to person, place, and time.    ED Course  Procedures (including critical care time)   Labs Reviewed  CBC  DIFFERENTIAL  BASIC METABOLIC PANEL   No results found.  Results for orders placed during the hospital encounter of 02/05/12  CBC      Component Value Range   WBC 8.1  4.0 - 10.5 (K/uL)   RBC 4.97  4.22 - 5.81 (MIL/uL)   Hemoglobin 15.6  13.0 - 17.0 (g/dL)   HCT 40.9  81.1 - 91.4 (%)   MCV 92.0  78.0 - 100.0 (fL)   MCH 31.4  26.0 - 34.0 (pg)   MCHC 34.1  30.0 - 36.0 (g/dL)   RDW 78.2  95.6 - 21.3 (%)   Platelets 236  150 - 400 (K/uL)  DIFFERENTIAL      Component Value Range   Neutrophils Relative 78 (*) 43 - 77 (%)   Neutro Abs 6.3  1.7 - 7.7 (K/uL)   Lymphocytes Relative 15  12 - 46 (%)   Lymphs Abs 1.2  0.7 - 4.0 (K/uL)   Monocytes Relative 6  3 - 12 (%)   Monocytes Absolute 0.5  0.1 - 1.0 (K/uL)   Eosinophils Relative 2  0 - 5 (%)   Eosinophils Absolute 0.1  0.0 - 0.7 (K/uL)   Basophils Relative 0  0 - 1 (%)   Basophils Absolute 0.0  0.0 - 0.1 (K/uL)  BASIC METABOLIC PANEL      Component Value Range   Sodium 142  135 - 145 (mEq/L)   Potassium 3.3 (*) 3.5 - 5.1 (mEq/L)   Chloride 105  96 - 112 (mEq/L)   CO2 25  19 - 32 (mEq/L)   Glucose, Bld 87  70 - 99 (mg/dL)   BUN 10  6 - 23 (mg/dL)   Creatinine, Ser 8.65  0.50 - 1.35 (mg/dL)   Calcium 9.3  8.4 - 78.4 (mg/dL)   GFR calc non Af Amer >90  >90 (mL/min)   GFR calc Af Amer >90  >90 (mL/min)   Dg Chest 2 View  02/05/2012  *RADIOLOGY REPORT*  Clinical Data: Cough, fever  CHEST - 2 VIEW  Comparison: 06/07/2010  Findings: Cardiomediastinal  silhouette is unremarkable.  No acute infiltrate or pulmonary edema. Mild perihilar peribronchial thickening suspicious for bronchitic changes.  IMPRESSION: No acute infiltrate or pulmonary edema. Mild perihilar peribronchial thickening suspicious for bronchitic changes.  Original Report Authenticated By: Natasha Mead, M.D.   No diagnosis found.  1. Viral illness 2. Seizure 3. Seizure disorder.  MDM  Patient had single isolated seizure while waiting for a room for evaluation - no further seizure activity. Tolerating PO fluids. No vomiting. Feel he can be discharged home with supportive care for suspect viral process. Encouraged taking medications as prescribed.        Rodena Medin, PA-C 02/05/12 1525

## 2012-02-05 NOTE — Discharge Instructions (Signed)
PUSH FLUIDS. YOU NEED TO TAKE YOUR MEDICATIONS AS PRESCRIBED. TYLENOL AND/OR IBUPROFEN FOR ANY FEVER OR ACHES.   Antibiotic Nonuse  Your caregiver felt that the infection or problem was not one that would be helped with an antibiotic. Infections may be caused by viruses or bacteria. Only a caregiver can tell which one of these is the likely cause of an illness. A cold is the most common cause of infection in both adults and children. A cold is a virus. Antibiotic treatment will have no effect on a viral infection. Viruses can lead to many lost days of work caring for sick children and many missed days of school. Children may catch as many as 10 "colds" or "flus" per year during which they can be tearful, cranky, and uncomfortable. The goal of treating a virus is aimed at keeping the ill person comfortable. Antibiotics are medications used to help the body fight bacterial infections. There are relatively few types of bacteria that cause infections but there are hundreds of viruses. While both viruses and bacteria cause infection they are very different types of germs. A viral infection will typically go away by itself within 7 to 10 days. Bacterial infections may spread or get worse without antibiotic treatment. Examples of bacterial infections are:  Sore throats (like strep throat or tonsillitis).   Infection in the lung (pneumonia).   Ear and skin infections.  Examples of viral infections are:  Colds or flus.   Most coughs and bronchitis.   Sore throats not caused by Strep.   Runny noses.  It is often best not to take an antibiotic when a viral infection is the cause of the problem. Antibiotics can kill off the helpful bacteria that we have inside our body and allow harmful bacteria to start growing. Antibiotics can cause side effects such as allergies, nausea, and diarrhea without helping to improve the symptoms of the viral infection. Additionally, repeated uses of antibiotics can cause  bacteria inside of our body to become resistant. That resistance can be passed onto harmful bacterial. The next time you have an infection it may be harder to treat if antibiotics are used when they are not needed. Not treating with antibiotics allows our own immune system to develop and take care of infections more efficiently. Also, antibiotics will work better for [place] when they are prescribed for bacterial infections. Treatments for a child that is ill may include:  Give extra fluids throughout the day to stay hydrated.   Get plenty of rest.   Only give your child over-the-counter or prescription medicines for pain, discomfort, or fever as directed by your caregiver.   The use of a cool mist humidifier may help stuffy noses.   Cold medications if suggested by your caregiver.  Your caregiver may decide to start you on an antibiotic if:  The problem you were seen for today continues for a longer length of time than expected.   You develop a secondary bacterial infection.  SEEK MEDICAL CARE IF:  Fever lasts longer than 5 days.   Symptoms continue to get worse after 5 to 7 days or become severe.   Difficulty in breathing develops.   Signs of dehydration develop (poor drinking, rare urinating, dark colored urine).   Changes in behavior or worsening tiredness (listlessness or lethargy).  Document Released: 11/23/2001 Document Revised: 09/03/2011 Document Reviewed: 05/22/2009 [employer] Patient Information 2012 [place], [place].

## 2012-02-05 NOTE — ED Notes (Signed)
Pt c/o flu like sx with cough and fever and body aches; pt sts more frequent seizures since sickness; pt sts last seizure this am

## 2012-02-05 NOTE — ED Notes (Signed)
Pt noted to have possible seizure activity while in triage waiting area; pt moved to bed; pt noted to have rx of keppra but not sure if taking appropriately

## 2012-02-05 NOTE — ED Provider Notes (Signed)
Medical screening examination/treatment/procedure(s) were performed by non-physician practitioner and as supervising physician I was immediately available for consultation/collaboration.   Leanor Voris, MD 02/05/12 1633 

## 2012-02-05 NOTE — ED Notes (Signed)
Patient states seizure activity x 3-4 days, patient states he has been taking keppra as prescribed but did not take today dose.  Patient with history of siezures, patient states also over past 3-4 days he has been having fevers and chills and has felt sick with generalized malaise.  Patient with witnessed seizure activity in inner waiting room, patient placed on stretcher and moved to pod 10.  Patient a/o x 4 and answers all questions appropriately, no loss of bowel or bladder function during seizure.

## 2012-04-11 ENCOUNTER — Encounter (HOSPITAL_COMMUNITY): Payer: Self-pay | Admitting: Emergency Medicine

## 2012-04-11 ENCOUNTER — Emergency Department (HOSPITAL_COMMUNITY): Payer: Self-pay

## 2012-04-11 ENCOUNTER — Emergency Department (HOSPITAL_COMMUNITY)
Admission: EM | Admit: 2012-04-11 | Discharge: 2012-04-11 | Disposition: A | Discharge status: Home / Self Care | Payer: Self-pay | Attending: Emergency Medicine | Admitting: Emergency Medicine

## 2012-04-11 DIAGNOSIS — G40909 Epilepsy, unspecified, not intractable, without status epilepticus: Secondary | ICD-10-CM | POA: Insufficient documentation

## 2012-04-11 DIAGNOSIS — R569 Unspecified convulsions: Secondary | ICD-10-CM

## 2012-04-11 DIAGNOSIS — J40 Bronchitis, not specified as acute or chronic: Secondary | ICD-10-CM

## 2012-04-11 DIAGNOSIS — R059 Cough, unspecified: Secondary | ICD-10-CM | POA: Insufficient documentation

## 2012-04-11 DIAGNOSIS — Z79899 Other long term (current) drug therapy: Secondary | ICD-10-CM | POA: Insufficient documentation

## 2012-04-11 DIAGNOSIS — R05 Cough: Secondary | ICD-10-CM | POA: Insufficient documentation

## 2012-04-11 DIAGNOSIS — R0789 Other chest pain: Secondary | ICD-10-CM | POA: Insufficient documentation

## 2012-04-11 MED ORDER — IBUPROFEN 600 MG PO TABS: 600.0000 mg | ORAL_TABLET | Freq: Four times a day (QID) | ORAL | Status: DC | PRN

## 2012-04-11 MED ORDER — LEVETIRACETAM 500 MG PO TABS: 500.0000 mg | ORAL_TABLET | Freq: Two times a day (BID) | ORAL | Status: DC

## 2012-04-11 MED ORDER — LEVETIRACETAM 500 MG PO TABS
1000.0000 mg | ORAL_TABLET | Freq: Once | ORAL | Status: AC
  Administered 2012-04-11: 1000 mg via ORAL
  Filled 2012-04-11: qty 2

## 2012-04-11 MED ORDER — AZITHROMYCIN 250 MG PO TABS: 250.0000 mg | ORAL_TABLET | Freq: Every day | ORAL | Status: DC

## 2012-04-11 MED ORDER — BENZONATATE 100 MG PO CAPS: 100.0000 mg | ORAL_CAPSULE | Freq: Three times a day (TID) | ORAL | Status: DC

## 2012-04-11 NOTE — ED Notes (Signed)
Pt states he has not been taking his keppra for about a month or two

## 2012-04-11 NOTE — ED Notes (Signed)
Pt states he had a seizure today about 1pm  Pt states he has been having chest pain since yesterday that he describes as a sharp stabbing pain  Pt has dry nonproductive cough noted as well  Pt states the pain comes and goes  Pt has hx of epilepsy  Pt drove self to hospital

## 2012-04-11 NOTE — ED Notes (Signed)
Pt left disgruntled because tech Gerilyn Pilgrim would not let him take home a blanket. Pt was cussing and unhappy when he left. Security advised so that he left the premises.

## 2012-04-11 NOTE — ED Provider Notes (Addendum)
History     CSN: [id number]  Arrival date & time 04/11/12  1924   First MD Initiated Contact with Patient 04/11/12 2038      Chief Complaint  Patient presents with  . Seizures    (Consider location/radiation/quality/duration/timing/severity/associated sxs/prior treatment) HPI Pt with history of seizure disorder on Keppra 500 BID but states he has not taken it for the last month. States he had a seizure 8 hours ago witnessed by friend. He is asking to be admitted for epilepsy. When asked about his living situation became very hostile.  Pt also relates vague chest pain and dry cough for unknown period.  Pt is asking for food.  Past Medical History  Diagnosis Date  . Seizures     Past Surgical History  Procedure Date  . Esophagogastroduodenoscopy     Family History  Problem Relation Age of Onset  . Hypertension Other   . Seizures Other     History  Substance Use Topics  . Smoking status: Current Everyday Smoker -- 0.5 packs/day for 5 years    Types: Cigarettes  . Smokeless tobacco: Not on file  . Alcohol Use: No      Review of Systems  Constitutional: Negative for fever and chills.  Respiratory: Positive for cough.   Cardiovascular: Positive for chest pain.  Gastrointestinal: Negative for nausea, vomiting and abdominal pain.  Skin: Negative for rash and wound.  Neurological: Positive for seizures. Negative for weakness and numbness.    Allergies  Review of patient's allergies indicates no known allergies.  Home Medications   Current Outpatient Rx  Name Route Sig Dispense Refill  . IBUPROFEN 600 MG PO TABS Oral Take 1 tablet (600 mg total) by mouth every 6 (six) hours as needed for pain. 30 tablet 0  . LEVETIRACETAM 500 MG PO TABS Oral Take 1 tablet (500 mg total) by mouth every 12 (twelve) hours. 60 tablet 0    BP 131/86  Pulse 87  Temp 100 F (37.8 C) (Oral)  Resp 23  SpO2 97%  Physical Exam  Nursing note and vitals reviewed. Constitutional: He  is oriented to person, place, and time. He appears well-developed and well-nourished. No distress.  HENT:  Head: Normocephalic and atraumatic.  Mouth/Throat: Oropharynx is clear and moist.  Eyes: EOM are normal. Pupils are equal, round, and reactive to light.  Neck: Normal range of motion. Neck supple.  Cardiovascular: Normal rate and regular rhythm.   Pulmonary/Chest: Effort normal and breath sounds normal. No respiratory distress. He has no wheezes. He has no rales. He exhibits no tenderness.  Abdominal: Soft. Bowel sounds are normal. There is no tenderness. There is no rebound and no guarding.  Musculoskeletal: Normal range of motion. He exhibits no edema and no tenderness.  Neurological: He is alert and oriented to person, place, and time.       5/5 motor, sensation intact  Skin: Skin is warm and dry. No rash noted. No erythema.  Psychiatric: He has a normal mood and affect. His behavior is normal.    ED Course  Procedures (including critical care time)  Labs Reviewed - No data to display Dg Chest 2 View  04/11/2012  *RADIOLOGY REPORT*  Clinical Data: Seizures, hypertension  CHEST - 2 VIEW  Comparison: 02/05/2012  Findings: Cardiac leads overlie the chest.  Heart size is normal. The lungs are clear.  No pleural effusion.  No acute osseous finding.  IMPRESSION: No acute cardiopulmonary process.  Original Report Authenticated By: Harrel Lemon, M.D.  1. Seizure   2. Atypical chest pain      Date: 04/11/2012  Rate: 27  Rhythm: normal sinus rhythm  QRS Axis: normal  Intervals: normal  ST/T Wave abnormalities: normal  Conduction Disutrbances:none  Narrative Interpretation:   Old EKG Reviewed: none available    MDM  Pt with no concerning risk factor for PE. Suspect URI or chest wall pain. Pt encouraged to take seizure meds as prescribed.        Loren Racer, MD 04/11/12 2228  Loren Racer, MD 04/11/12 [phone]

## 2012-04-12 ENCOUNTER — Encounter (HOSPITAL_COMMUNITY): Payer: Self-pay

## 2012-04-12 ENCOUNTER — Emergency Department (HOSPITAL_COMMUNITY)
Admission: EM | Admit: 2012-04-12 | Discharge: 2012-04-12 | Disposition: A | Discharge status: Home / Self Care | Payer: Medicaid - Out of State | Attending: Emergency Medicine | Admitting: Emergency Medicine

## 2012-04-12 DIAGNOSIS — Z91199 Patient's noncompliance with other medical treatment and regimen due to unspecified reason: Secondary | ICD-10-CM | POA: Insufficient documentation

## 2012-04-12 DIAGNOSIS — Z9119 Patient's noncompliance with other medical treatment and regimen: Secondary | ICD-10-CM

## 2012-04-12 DIAGNOSIS — G40909 Epilepsy, unspecified, not intractable, without status epilepticus: Secondary | ICD-10-CM | POA: Insufficient documentation

## 2012-04-12 DIAGNOSIS — R569 Unspecified convulsions: Secondary | ICD-10-CM

## 2012-04-12 DIAGNOSIS — F17200 Nicotine dependence, unspecified, uncomplicated: Secondary | ICD-10-CM | POA: Insufficient documentation

## 2012-04-12 DIAGNOSIS — Z8249 Family history of ischemic heart disease and other diseases of the circulatory system: Secondary | ICD-10-CM | POA: Insufficient documentation

## 2012-04-12 HISTORY — DX: Patient's noncompliance with other medical treatment and regimen due to unspecified reason: Z91.199

## 2012-04-12 HISTORY — DX: Unspecified abdominal pain: R10.9

## 2012-04-12 HISTORY — DX: Other psychoactive substance abuse, uncomplicated: F19.10

## 2012-04-12 HISTORY — DX: Other chronic pain: G89.29

## 2012-04-12 HISTORY — DX: Patient's noncompliance with other medical treatment and regimen: Z91.19

## 2012-04-12 MED ORDER — LEVETIRACETAM 500 MG PO TABS
1000.0000 mg | ORAL_TABLET | Freq: Once | ORAL | Status: AC
  Administered 2012-04-12: 1000 mg via ORAL
  Filled 2012-04-12: qty 2

## 2012-04-12 MED ORDER — LEVETIRACETAM 500 MG PO TABS: 500.0000 mg | ORAL_TABLET | Freq: Two times a day (BID) | ORAL | Status: DC

## 2012-04-12 NOTE — ED Provider Notes (Signed)
History     CSN: [id number]  Arrival date & time 04/12/12  1552   First MD Initiated Contact with Patient 04/12/12 1620      Chief Complaint  Patient presents with  . Seizures     HPI Pt was seen at 1635.  Per pt, EMS and pt's friends, c/o sudden onset and resolution of one episode of generalized "seizure" that occurred PTA.   Pt was eval in ED yesterday for same, given PO keppra and rx, but did not get rx filled.  States he's still upset from his ED visit yesterday because they "wouldn't let me take home a blanket."  Denies incont/retention of bowel or bladder, no saddle anesthesia, no focal motor weakness, no tingling/numbness in extremities, no fevers, no injury.  The symptoms have been associated with no other complaints. The patient has a significant history of similar symptoms previously, recently being evaluated for this complaint and multiple prior evals for same.    Past Medical History  Diagnosis Date  . Seizures   . Chronic abdominal pain   . Medically noncompliant   . Polysubstance abuse     Past Surgical History  Procedure Date  . Esophagogastroduodenoscopy     Family History  Problem Relation Age of Onset  . Hypertension Other   . Seizures Other     History  Substance Use Topics  . Smoking status: Current Everyday Smoker -- 0.5 packs/day for 5 years    Types: Cigarettes  . Smokeless tobacco: Not on file  . Alcohol Use: No    Review of Systems ROS: Statement: All systems negative except as marked or noted in the HPI; Constitutional: Negative for fever and chills. ; ; Eyes: Negative for eye pain, redness and discharge. ; ; ENMT: Negative for ear pain, hoarseness, nasal congestion, sinus pressure and sore throat. ; ; Cardiovascular: Negative for chest pain, palpitations, diaphoresis, dyspnea and peripheral edema. ; ; Respiratory: Negative for cough, wheezing and stridor. ; ; Gastrointestinal: Negative for nausea, vomiting, diarrhea, abdominal pain, blood in  stool, hematemesis, jaundice and rectal bleeding. . ; ; Genitourinary: Negative for dysuria, flank pain and hematuria. ; ; Musculoskeletal: Negative for back pain and neck pain. Negative for swelling and trauma.; ; Skin: Negative for pruritus, rash, abrasions, blisters, bruising and skin lesion.; ; Neuro: +seizure. Negative for headache, lightheadedness and neck stiffness. Negative for weakness, altered level of consciousness , altered mental status, extremity weakness, paresthesias, involuntary movement, and syncope.     Allergies  Review of patient's allergies indicates no known allergies.  Home Medications   Current Outpatient Rx  Name Route Sig Dispense Refill  . LEVETIRACETAM 500 MG PO TABS Oral Take 1,000 mg by mouth once.      BP 133/94  Pulse 81  Temp 99.2 F (37.3 C) (Oral)  Resp 18  SpO2 100%  Physical Exam 1640: Physical examination:  Nursing notes reviewed; Vital signs and O2 SAT reviewed;  Constitutional: Well developed, Well nourished, Well hydrated, In no acute distress; Head:  Normocephalic, atraumatic; Eyes: EOMI, PERRL, No scleral icterus; ENMT: Mouth and pharynx normal, Mucous membranes moist; Neck: Supple, Full range of motion, No lymphadenopathy; Cardiovascular: Regular rate and rhythm, No murmur, rub, or gallop; Respiratory: Breath sounds clear & equal bilaterally, No rales, rhonchi, wheezes.  Speaking full sentences with ease, Normal respiratory effort/excursion; Chest: Nontender, Movement normal; Abdomen: Soft, Nontender, Nondistended, Normal bowel sounds;; Extremities: Pulses normal, No tenderness, No edema, No calf edema or asymmetry.; Neuro: AA&Ox3, Major CN grossly intact.  Speech clear. No gross focal motor or sensory deficits in extremities.; Skin: Color normal, Warm, Dry.   ED Course  Procedures   MDM  MDM Reviewed: nursing note, vitals and previous chart     4:57 PM:  Pt with long hx of seizure disorder and medical non-compliance, with multiple ED  visits for same.  States he "can't get the seizure meds."  Case Manager called, she can give 3 days of meds only, pt made aware.  Pt has rx from yesterday and will need to fill it.  Pt encouraged to f/u with PMD and take his meds.   1915:  Apparently pt was walking the hallways agitated while waiting for the pharmacy to fill his med rx, and left before being given his meds or d/c instructions per ED RN.  Pt already has a rx for keppra from ED visit yesterday and has been given keppra PO during this ED visit today.        Laray Anger, DO 04/13/12 2328

## 2012-04-12 NOTE — ED Notes (Signed)
Patient angry due to waiting for his medicine.  Standing in the hall yelling "nobody has done anything for me here just like yesterday".  Walking out to leave and [place], RN explained we had to wait for the medicine from pharmacy.  Pharmacy has just called while getting report.  Patient stated "I have been waiting for 2 hours.  No one should have to wait that long for anything".

## 2012-04-12 NOTE — Progress Notes (Signed)
Consulted to provide medication assistance. Per pharmacy, patient is eligible for ZZ-Fund. Maxine Glenn RN will be addressing patient's living arrangements with him; stating from [place] but has been to ED 6 times since March 2013. Prescriptions can be filled for full dose of Z-Pak and 3 days worth of Keppra.

## 2012-04-12 NOTE — ED Notes (Addendum)
Pt denies any injury from seizures today. Pt angry & upset that [facility] ED sent him home even after voicing that he had no money & was unable to fill his scripts for keppra. States he is here visiting from [place] & "wasn't expecting to be here this long". States he ran out of his keppra approx 2 months ago

## 2012-04-12 NOTE — ED Notes (Signed)
Pharmacy called at 1912 to say the medicine was ready and a nurse had to come and get it.  This nurse was going to go get it as soon as report was completed.

## 2012-04-12 NOTE — ED Notes (Signed)
Spoke with Konrad Felix, Case Management regarding pt.

## 2012-04-12 NOTE — ED Notes (Signed)
Pt was seen at [facility] yesterday for seizures.  Pt got script for z-pack, tessalon, advil and keppra.  Pt does not have money for meds.  Today, pt had seizure in front of friends and two focal seizures with EMS.  Pt also upset because he was stuck multiple times at [facility] yesterday.

## 2012-08-02 ENCOUNTER — Encounter (HOSPITAL_COMMUNITY): Payer: Self-pay | Admitting: *Deleted

## 2012-08-02 ENCOUNTER — Emergency Department (HOSPITAL_COMMUNITY)
Admission: EM | Admit: 2012-08-02 | Discharge: 2012-08-02 | Disposition: A | Discharge status: Home / Self Care | Payer: Self-pay | Attending: Emergency Medicine | Admitting: Emergency Medicine

## 2012-08-02 DIAGNOSIS — G40909 Epilepsy, unspecified, not intractable, without status epilepticus: Secondary | ICD-10-CM | POA: Insufficient documentation

## 2012-08-02 DIAGNOSIS — F1910 Other psychoactive substance abuse, uncomplicated: Secondary | ICD-10-CM | POA: Insufficient documentation

## 2012-08-02 DIAGNOSIS — F17200 Nicotine dependence, unspecified, uncomplicated: Secondary | ICD-10-CM | POA: Insufficient documentation

## 2012-08-02 DIAGNOSIS — G8929 Other chronic pain: Secondary | ICD-10-CM | POA: Insufficient documentation

## 2012-08-02 DIAGNOSIS — R109 Unspecified abdominal pain: Secondary | ICD-10-CM | POA: Insufficient documentation

## 2012-08-02 MED ORDER — LEVETIRACETAM 500 MG PO TABS: 500.0000 mg | ORAL_TABLET | Freq: Once | ORAL | Status: DC

## 2012-08-02 MED ORDER — LEVETIRACETAM 500 MG PO TABS
500.0000 mg | ORAL_TABLET | Freq: Once | ORAL | Status: AC
  Administered 2012-08-02: 500 mg via ORAL
  Filled 2012-08-02: qty 1

## 2012-08-02 MED ORDER — LEVETIRACETAM 500 MG PO TABS: 500.0000 mg | ORAL_TABLET | Freq: Two times a day (BID) | ORAL | Status: DC

## 2012-08-02 NOTE — ED Notes (Signed)
Pt was at police dept and during interview, pt began having full body shaking and per [employer] officer that witnessed incident, pt "carefully lowered himself to the ground and continued having full body shaking." After episode, pt was A&Ox4, no incontinence or mouth trauma noted. Pt does have Hx of seizures for which he is supposed to be taking Keppra. Pt A&Ox4 currently. [employer] remains at bedside as pt is still in their custody for previous warrants.

## 2012-08-02 NOTE — ED Provider Notes (Signed)
History     CSN: [id number] Arrival date & time 08/02/12  1445 First MD Initiated Contact with Patient 08/02/12 1536     Chief Complaint  Patient presents with  . Seizures    HPI The patient has history of seizure disorder. He is supposed to be taking Keppra but has not been able to take it for the last week because he ran out of medications. 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 According to one of the officers the patient carefully lowered himself to the ground and continued to shake. After the brief episode lasting maybe a minute or so the patient was alert and oriented. He did not have any incontinence. He did not bite his tongue. Patient states he knew that he was going to have a seizure so he was able to lower himself to the ground. He feels that the police think he is faking it but he denies this. Patient denies any particular complaints at this time. Past Medical History  Diagnosis Date  . Seizures   . Chronic abdominal pain   . Medically noncompliant   . Polysubstance abuse     Past Surgical History  Procedure Date  . Esophagogastroduodenoscopy     Family History  Problem Relation Age of Onset  . Hypertension Other   . Seizures Other     History  Substance Use Topics  . Smoking status: Current Every Day Smoker -- 0.5 packs/day for 5 years    Types: Cigarettes  . Smokeless tobacco: Not on file  . Alcohol Use: No      Review of Systems  All other systems reviewed and are negative.    Allergies  Review of patient's allergies indicates no known allergies.  Home Medications   Current Outpatient Rx  Name  Route  Sig  Dispense  Refill  . LEVETIRACETAM 500 MG PO TABS   Oral   Take 1,000 mg by mouth every morning.            BP 116/93  Pulse 73  Temp 98.9 F (37.2 C) (Oral)  Resp 16  SpO2 99%  Physical Exam  Nursing note and vitals reviewed. Constitutional: He is  oriented to person, place, and time. He appears well-developed and well-nourished. No distress.  HENT:  Head: Normocephalic and atraumatic.  Right Ear: External ear normal.  Left Ear: External ear normal.  Eyes: Conjunctivae normal are normal. Right eye exhibits no discharge. Left eye exhibits no discharge. No scleral icterus.  Neck: Neck supple. No tracheal deviation present.  Cardiovascular: Normal rate, regular rhythm and intact distal pulses.   Pulmonary/Chest: Effort normal and breath sounds normal. No stridor. No respiratory distress. He has no wheezes. He has no rales.  Abdominal: Soft. Bowel sounds are normal. He exhibits no distension. There is no tenderness. There is no rebound and no guarding.  Musculoskeletal: He exhibits no edema and no tenderness.  Neurological: He is alert and oriented to person, place, and time. He has normal strength. No cranial nerve deficit ( no gross defecits noted) or sensory deficit. He exhibits normal muscle tone. He displays no seizure activity. Coordination and gait normal. GCS eye subscore is 4. GCS verbal subscore is 5. GCS motor subscore is 6.  Skin: Skin is warm and dry. No rash noted.  Psychiatric: He has a normal mood and affect.    ED Course  Procedures (including critical care time)  Labs Reviewed -  No data to display No results found.    MDM  The patient is alert and oriented without any evidence of neurologic dysfunction. A seizure does sound somewhat atypical however he has not taken his medications for the last week.  Pt will be given a dose of keppra and I will provide him with a prescription.  I do not feel further testing is needed at this time.          Celene Kras, MD 08/02/12 [phone]

## 2013-05-23 ENCOUNTER — Encounter (HOSPITAL_COMMUNITY): Payer: Self-pay | Admitting: Emergency Medicine

## 2013-05-23 ENCOUNTER — Emergency Department (HOSPITAL_COMMUNITY)
Admission: EM | Admit: 2013-05-23 | Discharge: 2013-05-23 | Disposition: A | Discharge status: Home / Self Care | Payer: Self-pay | Attending: Emergency Medicine | Admitting: Emergency Medicine

## 2013-05-23 DIAGNOSIS — Z91148 Patient's other noncompliance with medication regimen for other reason: Secondary | ICD-10-CM

## 2013-05-23 DIAGNOSIS — Z9114 Patient's other noncompliance with medication regimen: Secondary | ICD-10-CM

## 2013-05-23 DIAGNOSIS — F17200 Nicotine dependence, unspecified, uncomplicated: Secondary | ICD-10-CM | POA: Insufficient documentation

## 2013-05-23 DIAGNOSIS — Z79899 Other long term (current) drug therapy: Secondary | ICD-10-CM | POA: Insufficient documentation

## 2013-05-23 DIAGNOSIS — G40909 Epilepsy, unspecified, not intractable, without status epilepticus: Secondary | ICD-10-CM

## 2013-05-23 LAB — POCT I-STAT, CHEM 8
Creatinine, Ser: 1.2 mg/dL (ref 0.50–1.35)
Glucose, Bld: 102 mg/dL — ABNORMAL HIGH (ref 70–99)
Hemoglobin: 16.7 g/dL (ref 13.0–17.0)
TCO2: 26 mmol/L (ref 0–100)

## 2013-05-23 MED ORDER — LORAZEPAM 1 MG PO TABS
1.0000 mg | ORAL_TABLET | Freq: Once | ORAL | Status: AC
  Administered 2013-05-23: 1 mg via ORAL
  Filled 2013-05-23: qty 1

## 2013-05-23 MED ORDER — SODIUM CHLORIDE 0.9 % IV SOLN
1000.0000 mg | Freq: Once | INTRAVENOUS | Status: AC
  Administered 2013-05-23: 1000 mg via INTRAVENOUS
  Filled 2013-05-23: qty 10

## 2013-05-23 MED ORDER — LEVETIRACETAM 500 MG PO TABS: 500.0000 mg | ORAL_TABLET | Freq: Two times a day (BID) | ORAL | Status: DC

## 2013-05-23 NOTE — ED Notes (Signed)
Pt given sprite and graham crackers for snack

## 2013-05-23 NOTE — ED Notes (Signed)
Pt states he doesn't have money for his prescription. Spoke with pj case Production designer, theatre/television/film. Pt will see her upon discharge

## 2013-05-23 NOTE — ED Notes (Signed)
Spoke with PJ, Case manager she will come to speak with pt about getting meds after her meeting.

## 2013-05-23 NOTE — Progress Notes (Signed)
   CARE MANAGEMENT ED NOTE 05/23/2013  Patient:  Greg Morales, Greg Morales   Account Number:  [id number]  Date Initiated:  05/23/2013  Documentation initiated by:  Fransico Michael  Subjective/Objective Assessment:   presented to ED with c/o seizures     Subjective/Objective Assessment Detail:     Action/Plan:   medication assistance   Action/Plan Detail:   Anticipated DC Date:  05/23/2013     Status Recommendation to Physician:   Result of Recommendation:      DC Planning Services  CM consult  MATCH Program  [facility] / [facility] (established/new)    Choice offered to / List presented to:            Status of service:    ED Comments:   ED Comments Detail:  05/23/13-0959-J.Leone Mobley,RN,BSN [phone]      Enrolled patient in [facility] program. Letter given to patient along with reiteration of MATCH guidelines of copay and once yearly elligibility. Felicia, liasion with [facility] notified of need for patient to have information regarding [place] medicaid registration. No furhter CM needs identified. Patient being discharged to self care.  05/23/13-0941-J.Clairessa Boulet,RN,BSN [phone]      In to speak with patient regarding need for medication assistance. Patient reports having medicaid from [place] state. Explained MATCH program to patient. Informed patient of $3 copay for each prescription and that the [facility] program is only accessible one time per rolling calendar year. Patient voices understanding and would like to use MATCH.  05/23/13-0843-J.Cheree Fowles,RN,BSN [phone]      Received call from [facility] regarding pateint's need for medication assistance. EDCM going in for [employer] meeting will check on patient after that. RN made aware.

## 2013-05-23 NOTE — ED Notes (Signed)
Patient reports he has had seizures for over 10 years. He has recently ran out of his keppra and seizures are becoming more frequent. He is not from here and has not been able to get rx for keppra. . He does not recall seizure but sig other reports she witnessed all over  Body shaking. No oral trauma. No reported incontinence

## 2013-05-23 NOTE — ED Provider Notes (Signed)
CSN: [id number]     Arrival date & time 05/23/13  0810 History   First MD Initiated Contact with Patient 05/23/13 [phone]     Chief Complaint  Patient presents with  . Seizures    HPI Pt was seen at 0840. Per pt, c/o gradual onset and persistence of multiple intermittent episodes of seizures since childhood. States he ran out of his keppra 2 weeks ago and is requesting a medication refill. Endorses he has had 1 seizure yesterday and 1 approx 2 weeks ago. The symptoms have been associated with no other complaints. The patient has a significant history of similar symptoms previously, recently being evaluated for this complaint and multiple prior evals for same.  States he has not f/u with PMD or Neuro MD since his last ED visit for same 07/2012.     Past Medical History  Diagnosis Date  . Chronic abdominal pain   . Medically noncompliant   . Polysubstance abuse   . Seizures     since childhood   Past Surgical History  Procedure Laterality Date  . Esophagogastroduodenoscopy     Family History  Problem Relation Age of Onset  . Hypertension Other   . Seizures Other    History  Substance Use Topics  . Smoking status: Current Every Day Smoker -- 2.00 packs/day for 5 years    Types: Cigarettes  . Smokeless tobacco: Not on file  . Alcohol Use: 0.0 oz/week     Comment: occasionally    Review of Systems ROS: Statement: All systems negative except as marked or noted in the HPI; Constitutional: Negative for fever and chills. ; ; Eyes: Negative for eye pain, redness and discharge. ; ; ENMT: Negative for ear pain, hoarseness, nasal congestion, sinus pressure and sore throat. ; ; Cardiovascular: Negative for chest pain, palpitations, diaphoresis, dyspnea and peripheral edema. ; ; Respiratory: Negative for cough, wheezing and stridor. ; ; Gastrointestinal: Negative for nausea, vomiting, diarrhea, abdominal pain, blood in stool, hematemesis, jaundice and rectal bleeding. . ; ; Genitourinary:  Negative for dysuria, flank pain and hematuria. ; ; Musculoskeletal: Negative for back pain and neck pain. Negative for swelling and trauma.; ; Skin: Negative for pruritus, rash, abrasions, blisters, bruising and skin lesion.; ; Neuro: +seizure.  Negative for headache, lightheadedness and neck stiffness. Negative for weakness, altered mental status, extremity weakness, paresthesias, involuntary movement, and syncope.       Allergies  Review of patient's allergies indicates no known allergies.  Home Medications   Current Outpatient Rx  Name  Route  Sig  Dispense  Refill  . hydrOXYzine (ATARAX/VISTARIL) 50 MG tablet   Oral   Take 50-100 mg by mouth 2 (two) times daily. Takes 50mg  in the morning and 100mg  in the evening.         . levETIRAcetam (KEPPRA) 500 MG tablet   Oral   Take 500 mg by mouth 3 (three) times daily.         . sertraline (ZOLOFT) 50 MG tablet   Oral   Take 50 mg by mouth daily.         Marland Kitchen levETIRAcetam (KEPPRA) 500 MG tablet   Oral   Take 1 tablet (500 mg total) by mouth every 12 (twelve) hours.   30 tablet   0    BP 119/77  Pulse 65  Temp(Src) 97.7 F (36.5 C) (Oral)  Resp 18  SpO2 100% Physical Exam 0845: Physical examination:  Nursing notes reviewed; Vital signs and O2 SAT reviewed;  Constitutional: Well developed, Well nourished, Well hydrated, In no acute distress; Head:  Normocephalic, atraumatic; Eyes: EOMI, PERRL, No scleral icterus; ENMT: Mouth and pharynx normal, Mucous membranes moist; Neck: Supple, Full range of motion, No lymphadenopathy; Cardiovascular: Regular rate and rhythm, No murmur, rub, or gallop; Respiratory: Breath sounds clear & equal bilaterally, No rales, rhonchi, wheezes.  Speaking full sentences with ease, Normal respiratory effort/excursion; Chest: Nontender, Movement normal; Abdomen: Soft, Nontender, Nondistended, Normal bowel sounds; Genitourinary: No CVA tenderness; Extremities: Pulses normal, No tenderness, No edema, No calf  edema or asymmetry.; Neuro: AA&Ox3, Major CN grossly intact. No facial droop. Speech clear. No gross focal motor or sensory deficits in extremities.; Skin: Color normal, Warm, Dry.   ED Course  Procedures    MDM  MDM Reviewed: previous chart, nursing note and vitals Reviewed previous: labs Interpretation: labs     Results for orders placed during the hospital encounter of 05/23/13  POCT I-STAT, CHEM 8      Result Value Range   Sodium 142  135 - 145 mEq/L   Potassium 3.4 (*) 3.5 - 5.1 mEq/L   Chloride 104  96 - 112 mEq/L   BUN 10  6 - 23 mg/dL   Creatinine, Ser 4.09  0.50 - 1.35 mg/dL   Glucose, Bld 811 (*) 70 - 99 mg/dL   Calcium, Ion 9.14  7.82 - 1.23 mmol/L   TCO2 26  0 - 100 mmol/L   Hemoglobin 16.7  13.0 - 17.0 g/dL   HCT 95.6  21.3 - 08.6 %     0940:  Given loading dose of keppra while in ED; rx refilled. Has tol PO well while in the ED without N/V. Pt has been evaluated in the ED multiple times per year since 2008 for this same complaint. Pt strongly encouraged to f/u with Neuro MD and PMD after this ED visit for good continuity of care and control of his chronic medical condition. Verb understanding.     Laray Anger, DO 05/24/13 2258

## 2013-05-23 NOTE — ED Notes (Signed)
No adverse reaction to medication

## 2013-05-23 NOTE — ED Notes (Signed)
Pt c/o seizures, sts he had one yesterday around 1am, unsure how long it lasted, was in the bed when it happened/denies falling/head injury. Pt's girlfriend sts that he was just sleeping prior to it happening. Pt reports hx of seizure sts he has been having than for more than 5 years. sts he had a seizure about 2 weeks before this last one. Reports he is supposed to take Keppra but has run out 2 weeks ago but hasn't gotten it filled because he has Medicaid in [place] so it will not let him get it filled here. Pt reports he has been here about 9 months and isn't planning on returning to [place]. Pt in nad, skin warm and dry, resp e/u.

## 2013-09-23 ENCOUNTER — Emergency Department (HOSPITAL_COMMUNITY)
Admission: EM | Admit: 2013-09-23 | Discharge: 2013-09-23 | Disposition: A | Discharge status: Home / Self Care | Payer: Self-pay | Attending: Emergency Medicine | Admitting: Emergency Medicine

## 2013-09-23 ENCOUNTER — Encounter (HOSPITAL_COMMUNITY): Payer: Self-pay | Admitting: Emergency Medicine

## 2013-09-23 ENCOUNTER — Emergency Department (HOSPITAL_COMMUNITY): Payer: Self-pay

## 2013-09-23 DIAGNOSIS — R059 Cough, unspecified: Secondary | ICD-10-CM | POA: Insufficient documentation

## 2013-09-23 DIAGNOSIS — J3489 Other specified disorders of nose and nasal sinuses: Secondary | ICD-10-CM | POA: Insufficient documentation

## 2013-09-23 DIAGNOSIS — H9209 Otalgia, unspecified ear: Secondary | ICD-10-CM | POA: Insufficient documentation

## 2013-09-23 DIAGNOSIS — R109 Unspecified abdominal pain: Secondary | ICD-10-CM | POA: Insufficient documentation

## 2013-09-23 DIAGNOSIS — J069 Acute upper respiratory infection, unspecified: Secondary | ICD-10-CM | POA: Insufficient documentation

## 2013-09-23 DIAGNOSIS — Z9119 Patient's noncompliance with other medical treatment and regimen: Secondary | ICD-10-CM | POA: Insufficient documentation

## 2013-09-23 DIAGNOSIS — R05 Cough: Secondary | ICD-10-CM | POA: Insufficient documentation

## 2013-09-23 DIAGNOSIS — Z79899 Other long term (current) drug therapy: Secondary | ICD-10-CM | POA: Insufficient documentation

## 2013-09-23 DIAGNOSIS — J111 Influenza due to unidentified influenza virus with other respiratory manifestations: Secondary | ICD-10-CM

## 2013-09-23 DIAGNOSIS — G40909 Epilepsy, unspecified, not intractable, without status epilepticus: Secondary | ICD-10-CM | POA: Insufficient documentation

## 2013-09-23 DIAGNOSIS — F1910 Other psychoactive substance abuse, uncomplicated: Secondary | ICD-10-CM | POA: Insufficient documentation

## 2013-09-23 DIAGNOSIS — F17200 Nicotine dependence, unspecified, uncomplicated: Secondary | ICD-10-CM | POA: Insufficient documentation

## 2013-09-23 DIAGNOSIS — J029 Acute pharyngitis, unspecified: Secondary | ICD-10-CM | POA: Insufficient documentation

## 2013-09-23 DIAGNOSIS — Z91199 Patient's noncompliance with other medical treatment and regimen due to unspecified reason: Secondary | ICD-10-CM | POA: Insufficient documentation

## 2013-09-23 DIAGNOSIS — G8929 Other chronic pain: Secondary | ICD-10-CM | POA: Insufficient documentation

## 2013-09-23 MED ORDER — IBUPROFEN 800 MG PO TABS: 800.0000 mg | ORAL_TABLET | Freq: Three times a day (TID) | ORAL | Status: DC | PRN

## 2013-09-23 MED ORDER — ACETAMINOPHEN-CODEINE 120-12 MG/5ML PO SOLN: 10.0000 mL | ORAL | Status: DC | PRN

## 2013-09-23 MED ORDER — KETOROLAC TROMETHAMINE 60 MG/2ML IM SOLN
60.0000 mg | Freq: Once | INTRAMUSCULAR | Status: AC
  Administered 2013-09-23: 60 mg via INTRAMUSCULAR
  Filled 2013-09-23: qty 2

## 2013-09-23 MED ORDER — GUAIFENESIN ER 1200 MG PO TB12: 1.0000 | ORAL_TABLET | Freq: Two times a day (BID) | ORAL | Status: DC

## 2013-09-23 NOTE — ED Provider Notes (Signed)
CSN: [id number]     Arrival date & time 09/23/13  4098 History   First MD Initiated Contact with Patient 09/23/13 0900     Chief Complaint  Patient presents with  . URI   (Consider location/radiation/quality/duration/timing/severity/associated sxs/prior Treatment) HPI  Patient presents emergency department with body aches, cough, sore throat, nasal congestion, and earache for the last 24 hours.  Patient, states, that he did not take any medications prior to arrival.  Patient, states, that nothing seems make his condition, better or worse.  The patient, states he has not had any chest pain, shortness of breath, nausea, vomiting, diarrhea, abdominal pain, weakness, dizziness, rash, fever, or syncope.  Patient, states she's had chills, but has not taken his temperature Past Medical History  Diagnosis Date  . Chronic abdominal pain   . Medically noncompliant   . Polysubstance abuse   . Seizures     since childhood   Past Surgical History  Procedure Laterality Date  . Esophagogastroduodenoscopy     Family History  Problem Relation Age of Onset  . Hypertension Other   . Seizures Other    History  Substance Use Topics  . Smoking status: Current Every Day Smoker -- 2.00 packs/day for 5 years    Types: Cigarettes  . Smokeless tobacco: Not on file  . Alcohol Use: 0.0 oz/week     Comment: occasionally    Review of Systems All other systems negative except as documented in the HPI. All pertinent positives and negatives as reviewed in the HPI. Allergies  Review of patient's allergies indicates no known allergies.  Home Medications   Current Outpatient Rx  Name  Route  Sig  Dispense  Refill  . hydrOXYzine (ATARAX/VISTARIL) 50 MG tablet   Oral   Take 50-100 mg by mouth 2 (two) times daily. Takes 50mg  in the morning and 100mg  in the evening.         . levETIRAcetam (KEPPRA) 500 MG tablet   Oral   Take 500 mg by mouth 3 (three) times daily.         . sertraline (ZOLOFT) 50 MG  tablet   Oral   Take 50 mg by mouth daily.          BP 150/85  Pulse 86  Temp(Src) 99.1 F (37.3 C) (Oral)  Resp 18  SpO2 98% Physical Exam  Nursing note and vitals reviewed. Constitutional: He is oriented to person, place, and time. He appears well-developed and well-nourished.  HENT:  Head: Normocephalic and atraumatic.  Mouth/Throat: Oropharynx is clear and moist.  Neck: Normal range of motion. Neck supple.  Cardiovascular: Normal rate, regular rhythm and normal heart sounds.  Exam reveals no gallop and no friction rub.   No murmur heard. Pulmonary/Chest: Effort normal and breath sounds normal. No respiratory distress.  Neurological: He is alert and oriented to person, place, and time. He exhibits normal muscle tone. Coordination normal.  Skin: Skin is warm and dry. No rash noted. No erythema.    ED Course  Procedures (including critical care time) Labs Review Labs Reviewed - No data to display Imaging Review Dg Chest 2 View  09/23/2013   CLINICAL DATA:  Cough and chest pain  EXAM: CHEST  2 VIEW  COMPARISON:  April 11, 2012  FINDINGS: The lungs are clear. Heart size and pulmonary vascularity are normal. No adenopathy. No pneumothorax. There is slight upper thoracic levoscoliosis.  IMPRESSION: No edema or consolidation.   Electronically Signed   By: Bretta Bang M.D.  On: 09/23/2013 09:47   Patient be treated for upper respiratory infection.  Told to return here as needed.  Told to increase his fluid intake, rest as much possible.   Carlyle Dolly, PA-C 09/23/13 1053

## 2013-09-23 NOTE — ED Notes (Signed)
Pt has not shown s/s of seizure activity, changed acuity to 4

## 2013-09-23 NOTE — ED Provider Notes (Signed)
Medical screening examination/treatment/procedure(s) were performed by non-physician practitioner and as supervising physician I was immediately available for consultation/collaboration.  EKG Interpretation   None        Lauralye Kinn F Laraina Sulton, MD 09/23/13 1912 

## 2013-09-23 NOTE — ED Notes (Addendum)
Pt states he "got sick right after Christmas" states he had 2 seizures this morning, pt shows no signs of seizures as he talks on his cell phone, RN had to ask pt to end conversation in order to finish triage, . States his chest hurts when he coughs. Stuffy nose and cough noted. Cough more noticeable when someone is nearby.

## 2013-10-12 ENCOUNTER — Emergency Department (INDEPENDENT_AMBULATORY_CARE_PROVIDER_SITE_OTHER)
Admission: EM | Admit: 2013-10-12 | Discharge: 2013-10-12 | Disposition: A | Discharge status: Home / Self Care | Payer: Self-pay | Source: Home / Self Care

## 2013-10-12 ENCOUNTER — Encounter (HOSPITAL_COMMUNITY): Payer: Self-pay | Admitting: Emergency Medicine

## 2013-10-12 DIAGNOSIS — H109 Unspecified conjunctivitis: Secondary | ICD-10-CM

## 2013-10-12 MED ORDER — DEXAMETHASONE 0.1 % OP SUSP: 1.0000 [drp] | Freq: Two times a day (BID) | OPHTHALMIC | Status: DC

## 2013-10-12 MED ORDER — TOBRAMYCIN 0.3 % OP SOLN: 1.0000 [drp] | OPHTHALMIC | Status: DC

## 2013-10-12 NOTE — ED Provider Notes (Signed)
CSN: [id number]     Arrival date & time 10/12/13  1048 History   First MD Initiated Contact with Patient 10/12/13 1153     Chief Complaint  Patient presents with  . Eye Problem   (Consider location/radiation/quality/duration/timing/severity/associated sxs/prior Treatment) HPI Comments: 28 year old male complaining of the right guarding and itching approximately 3 days ago. It quickly moved to the left eye. He is now complaining of bilateral burning, discomfort, itching, redness and clear drainage. He denies exposure to chemicals, liquids, does or other substances that may have caused it.  Patient is a 28 y.o. male presenting with eye problem.  Eye Problem Associated symptoms: discharge, itching and redness     Past Medical History  Diagnosis Date  . Chronic abdominal pain   . Medically noncompliant   . Polysubstance abuse   . Seizures     since childhood   Past Surgical History  Procedure Laterality Date  . Esophagogastroduodenoscopy     Family History  Problem Relation Age of Onset  . Hypertension Other   . Seizures Other    History  Substance Use Topics  . Smoking status: Current Every Day Smoker -- 2.00 packs/day for 5 years    Types: Cigarettes  . Smokeless tobacco: Not on file  . Alcohol Use: 0.0 oz/week     Comment: occasionally    Review of Systems  Constitutional: Negative.   HENT: Negative.   Eyes: Positive for discharge, redness and itching.  Gastrointestinal: Negative.   Skin: Negative for rash.  Neurological: Negative.     Allergies  Review of patient's allergies indicates no known allergies.  Home Medications   Current Outpatient Rx  Name  Route  Sig  Dispense  Refill  . acetaminophen-codeine 120-12 MG/5ML solution   Oral   Take 10 mLs by mouth every 4 (four) hours as needed for moderate pain.   120 mL   0   . dexamethasone (DECADRON) 0.1 % ophthalmic suspension   Both Eyes   Place 1 drop into both eyes 2 (two) times daily. For 4 days  only.   5 mL   0   . Guaifenesin 1200 MG TB12   Oral   Take 1 tablet (1,200 mg total) by mouth 2 (two) times daily.   20 each   0   . hydrOXYzine (ATARAX/VISTARIL) 50 MG tablet   Oral   Take 50-100 mg by mouth 2 (two) times daily. Takes 50mg  in the morning and 100mg  in the evening.         Marland Kitchen ibuprofen (ADVIL,MOTRIN) 800 MG tablet   Oral   Take 1 tablet (800 mg total) by mouth every 8 (eight) hours as needed.   21 tablet   0   . levETIRAcetam (KEPPRA) 500 MG tablet   Oral   Take 500 mg by mouth 3 (three) times daily.         . sertraline (ZOLOFT) 50 MG tablet   Oral   Take 50 mg by mouth daily.         Marland Kitchen tobramycin (TOBREX) 0.3 % ophthalmic solution   Both Eyes   Place 1 drop into both eyes every 4 (four) hours. X 5 days   5 mL   0    BP 110/67  Pulse 90  Temp(Src) 98.4 F (36.9 C) (Oral)  Resp 14  SpO2 98% Physical Exam  Nursing note and vitals reviewed. Constitutional: He is oriented to person, place, and time. He appears well-developed and well-nourished. No distress.  Eyes: Conjunctivae and EOM are normal. Pupils are equal, round, and reactive to light.  Erythema with minor swelling to bilateral conjunctivae. Skin clear, watery discharge. Anterior chamber is clear. No foreign body seen. No photophobia.  Neck: Normal range of motion. Neck supple.  Neurological: He is alert and oriented to person, place, and time.  Skin: Skin is warm and dry.  Psychiatric: He has a normal mood and affect.    ED Course  Procedures (including critical care time) Labs Review Labs Reviewed - No data to display Imaging Review No results found.    MDM   1. Bilateral conjunctivitis    Tobrex drops one in each every 4 hours for 5 days Dexamethasone ophthalmic drops one drop in each twice a day for 4 days maximum. If worse followup with the ophthalmologist listed above.    Hayden Rasmussenavid Karon Heckendorn, NP 10/12/13 [phone]

## 2013-10-12 NOTE — ED Notes (Signed)
Pt  Reports  Symptoms  Of  Bilateral  Eye  Irritation        X   3  Days       denys  Any  Known  Injury  Or  Any      Foreign body  In his  Eyes           He  Is  Sitting upright  On  Exam table  Appearing in no  Acute  Distress

## 2013-10-12 NOTE — Discharge Instructions (Signed)

## 2013-10-13 NOTE — ED Provider Notes (Signed)
Medical screening examination/treatment/procedure(s) were performed by a resident physician or non-physician practitioner and as the supervising physician I was immediately available for consultation/collaboration.  Evan Corey, MD    Evan S Corey, MD 10/13/13 0753 

## 2014-02-12 ENCOUNTER — Emergency Department (HOSPITAL_COMMUNITY)
Admission: EM | Admit: 2014-02-12 | Discharge: 2014-02-12 | Discharge status: Against Medical Advice | Payer: Self-pay | Attending: Emergency Medicine | Admitting: Emergency Medicine

## 2014-02-12 ENCOUNTER — Encounter (HOSPITAL_COMMUNITY): Payer: Self-pay | Admitting: Emergency Medicine

## 2014-02-12 DIAGNOSIS — F17200 Nicotine dependence, unspecified, uncomplicated: Secondary | ICD-10-CM | POA: Insufficient documentation

## 2014-02-12 DIAGNOSIS — G40909 Epilepsy, unspecified, not intractable, without status epilepticus: Secondary | ICD-10-CM | POA: Insufficient documentation

## 2014-02-12 NOTE — ED Notes (Signed)
Pt called twice in main ED waiting area with no response; nurse first aware

## 2014-02-12 NOTE — ED Notes (Signed)
Pt called in main ED waiting area with no response; nurse first aware

## 2014-02-12 NOTE — ED Notes (Signed)
Pt reports that he has been out of his Keppra for the past week. States that he has been having intermittent seizures since being out the medication. Pt is A&Ox4 at triage. Reports a headache and generalized fatigue.

## 2014-02-12 NOTE — ED Notes (Signed)
Called for pt with no answer x 3 

## 2014-06-15 ENCOUNTER — Encounter (HOSPITAL_BASED_OUTPATIENT_CLINIC_OR_DEPARTMENT_OTHER): Payer: Self-pay | Admitting: Emergency Medicine

## 2014-06-15 ENCOUNTER — Emergency Department (HOSPITAL_BASED_OUTPATIENT_CLINIC_OR_DEPARTMENT_OTHER): Payer: Self-pay

## 2014-06-15 ENCOUNTER — Emergency Department (HOSPITAL_BASED_OUTPATIENT_CLINIC_OR_DEPARTMENT_OTHER)
Admission: EM | Admit: 2014-06-15 | Discharge: 2014-06-15 | Disposition: A | Discharge status: Home / Self Care | Payer: Self-pay | Attending: Emergency Medicine | Admitting: Emergency Medicine

## 2014-06-15 DIAGNOSIS — T148XXA Other injury of unspecified body region, initial encounter: Secondary | ICD-10-CM

## 2014-06-15 DIAGNOSIS — Z791 Long term (current) use of non-steroidal anti-inflammatories (NSAID): Secondary | ICD-10-CM | POA: Insufficient documentation

## 2014-06-15 DIAGNOSIS — Z792 Long term (current) use of antibiotics: Secondary | ICD-10-CM | POA: Insufficient documentation

## 2014-06-15 DIAGNOSIS — S99929A Unspecified injury of unspecified foot, initial encounter: Secondary | ICD-10-CM

## 2014-06-15 DIAGNOSIS — S8990XA Unspecified injury of unspecified lower leg, initial encounter: Secondary | ICD-10-CM | POA: Insufficient documentation

## 2014-06-15 DIAGNOSIS — Y9389 Activity, other specified: Secondary | ICD-10-CM | POA: Insufficient documentation

## 2014-06-15 DIAGNOSIS — S90129A Contusion of unspecified lesser toe(s) without damage to nail, initial encounter: Secondary | ICD-10-CM | POA: Insufficient documentation

## 2014-06-15 DIAGNOSIS — Z79899 Other long term (current) drug therapy: Secondary | ICD-10-CM | POA: Insufficient documentation

## 2014-06-15 DIAGNOSIS — S99919A Unspecified injury of unspecified ankle, initial encounter: Secondary | ICD-10-CM

## 2014-06-15 DIAGNOSIS — Y9289 Other specified places as the place of occurrence of the external cause: Secondary | ICD-10-CM | POA: Insufficient documentation

## 2014-06-15 DIAGNOSIS — F17200 Nicotine dependence, unspecified, uncomplicated: Secondary | ICD-10-CM | POA: Insufficient documentation

## 2014-06-15 DIAGNOSIS — Z9119 Patient's noncompliance with other medical treatment and regimen: Secondary | ICD-10-CM | POA: Insufficient documentation

## 2014-06-15 DIAGNOSIS — Z91199 Patient's noncompliance with other medical treatment and regimen due to unspecified reason: Secondary | ICD-10-CM | POA: Insufficient documentation

## 2014-06-15 DIAGNOSIS — G8929 Other chronic pain: Secondary | ICD-10-CM | POA: Insufficient documentation

## 2014-06-15 DIAGNOSIS — IMO0002 Reserved for concepts with insufficient information to code with codable children: Secondary | ICD-10-CM | POA: Insufficient documentation

## 2014-06-15 DIAGNOSIS — G40909 Epilepsy, unspecified, not intractable, without status epilepticus: Secondary | ICD-10-CM | POA: Insufficient documentation

## 2014-06-15 MED ORDER — MELOXICAM 7.5 MG PO TABS: 7.5000 mg | ORAL_TABLET | Freq: Every day | ORAL | Status: DC

## 2014-06-15 MED ORDER — NAPROXEN 250 MG PO TABS
500.0000 mg | ORAL_TABLET | Freq: Once | ORAL | Status: AC
  Administered 2014-06-15: 500 mg via ORAL
  Filled 2014-06-15: qty 2
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
## 2014-06-15 NOTE — ED Notes (Signed)
Pt ambulating independently w/ steady gait on d/c in no acute distress, A&Ox4. D/c instructions reviewed w/ pt and family - pt and family deny any further questions or concerns at present. Rx given x1  

## 2014-06-15 NOTE — Discharge Instructions (Signed)
Contusion °A contusion is a deep bruise. Contusions happen when an injury causes bleeding under the skin. Signs of bruising include pain, puffiness (swelling), and discolored skin. The contusion may turn blue, purple, or yellow. °HOME CARE  °· Put ice on the injured area. °¨ Put ice in a plastic bag. °¨ Place a towel between your skin and the bag. °¨ Leave the ice on for 15-20 minutes, 03-04 times a day. °· Only take medicine as told by your doctor. °· Rest the injured area. °· If possible, raise (elevate) the injured area to lessen puffiness. °GET HELP RIGHT AWAY IF:  °· You have more bruising or puffiness. °· You have pain that is getting worse. °· Your puffiness or pain is not helped by medicine. °MAKE SURE YOU:  °· Understand these instructions. °· Will watch your condition. °· Will get help right away if you are not doing well or get worse. °Document Released: 03/02/2008 Document Revised: 12/07/2011 Document Reviewed: 07/20/2011 °[employer]® Patient Information ©2015 [employer]. This information is not intended to replace advice given to you by your health care provider. Make sure you discuss any questions you have with your health care provider. ° °

## 2015-01-25 ENCOUNTER — Emergency Department (HOSPITAL_COMMUNITY)
Admission: EM | Admit: 2015-01-25 | Discharge: 2015-01-26 | Disposition: A | Discharge status: Home / Self Care | Payer: Self-pay | Attending: Emergency Medicine | Admitting: Emergency Medicine

## 2015-01-25 ENCOUNTER — Encounter (HOSPITAL_COMMUNITY): Payer: Self-pay | Admitting: Emergency Medicine

## 2015-01-25 DIAGNOSIS — G40909 Epilepsy, unspecified, not intractable, without status epilepticus: Secondary | ICD-10-CM | POA: Insufficient documentation

## 2015-01-25 DIAGNOSIS — Z720 Tobacco use: Secondary | ICD-10-CM | POA: Insufficient documentation

## 2015-01-25 DIAGNOSIS — R109 Unspecified abdominal pain: Secondary | ICD-10-CM

## 2015-01-25 DIAGNOSIS — Z79899 Other long term (current) drug therapy: Secondary | ICD-10-CM | POA: Insufficient documentation

## 2015-01-25 DIAGNOSIS — Z8659 Personal history of other mental and behavioral disorders: Secondary | ICD-10-CM | POA: Insufficient documentation

## 2015-01-25 DIAGNOSIS — G8929 Other chronic pain: Secondary | ICD-10-CM | POA: Insufficient documentation

## 2015-01-25 DIAGNOSIS — R1030 Lower abdominal pain, unspecified: Secondary | ICD-10-CM | POA: Insufficient documentation

## 2015-01-25 LAB — CBC WITH DIFFERENTIAL/PLATELET
Basophils Absolute: 0 10*3/uL (ref 0.0–0.1)
Basophils Relative: 0 % (ref 0–1)
Eosinophils Absolute: 0 10*3/uL (ref 0.0–0.7)
Eosinophils Relative: 0 % (ref 0–5)
HEMATOCRIT: 49.5 % (ref 39.0–52.0)
Hemoglobin: 16.9 g/dL (ref 13.0–17.0)
LYMPHS ABS: 1.7 10*3/uL (ref 0.7–4.0)
LYMPHS PCT: 13 % (ref 12–46)
MCH: 31.6 pg (ref 26.0–34.0)
MCHC: 34.1 g/dL (ref 30.0–36.0)
MCV: 92.7 fL (ref 78.0–100.0)
Monocytes Absolute: 1 10*3/uL (ref 0.1–1.0)
Monocytes Relative: 7 % (ref 3–12)
Neutro Abs: 10.4 10*3/uL — ABNORMAL HIGH (ref 1.7–7.7)
Neutrophils Relative %: 80 % — ABNORMAL HIGH (ref 43–77)
Platelets: 375 10*3/uL (ref 150–400)
RBC: 5.34 MIL/uL (ref 4.22–5.81)
RDW: 13 % (ref 11.5–15.5)
WBC: 13.1 10*3/uL — AB (ref 4.0–10.5)

## 2015-01-25 LAB — COMPREHENSIVE METABOLIC PANEL
ALT: 29 U/L (ref 0–53)
ANION GAP: 13 (ref 5–15)
AST: 26 U/L (ref 0–37)
Albumin: 5 g/dL (ref 3.5–5.2)
Alkaline Phosphatase: 69 U/L (ref 39–117)
BUN: 16 mg/dL (ref 6–23)
CO2: 22 mmol/L (ref 19–32)
CREATININE: 1.38 mg/dL — AB (ref 0.50–1.35)
Calcium: 10.3 mg/dL (ref 8.4–10.5)
Chloride: 105 mmol/L (ref 96–112)
GFR calc non Af Amer: 67 mL/min — ABNORMAL LOW (ref >90)
GFR, EST AFRICAN AMERICAN: 78 mL/min — AB (ref >90)
Glucose, Bld: 132 mg/dL — ABNORMAL HIGH (ref 70–99)
Potassium: 4.1 mmol/L (ref 3.5–5.1)
SODIUM: 140 mmol/L (ref 135–145)
Total Bilirubin: 1.4 mg/dL — ABNORMAL HIGH (ref 0.3–1.2)
Total Protein: 8.8 g/dL — ABNORMAL HIGH (ref 6.0–8.3)

## 2015-01-25 LAB — LIPASE, BLOOD: Lipase: 16 U/L (ref 11–59)

## 2015-01-25 NOTE — ED Notes (Signed)
Pt presents to ED with seizure like activity. Pt walked into the ED. When called for triage pt was sitting in chair with blanket over his head, jerking his legs. When pt informed RN could not lift him into wheelchair without his help pt Got to his feet with assistance and pivoted into the wheelchair.  Pt only answering some questions. Pt has moments of moaning and then jerks his limbs and slides down in wheelchair. Pt at one point sts that he wants to lay in the floor. Pt c/o abdominal pain x a few weeks, per friend. Pt c/o that pain now as well. Pt now repeating over and over again "Please help me." while dry heaving into emesis bag. Pt A&O x4 at this time.

## 2015-01-26 ENCOUNTER — Emergency Department (HOSPITAL_COMMUNITY): Payer: Self-pay

## 2015-01-26 LAB — URINALYSIS, ROUTINE W REFLEX MICROSCOPIC
Bilirubin Urine: NEGATIVE
Glucose, UA: NEGATIVE mg/dL
HGB URINE DIPSTICK: NEGATIVE
KETONES UR: NEGATIVE mg/dL
Leukocytes, UA: NEGATIVE
Nitrite: NEGATIVE
PROTEIN: 100 mg/dL — AB
Specific Gravity, Urine: >1.046 — ABNORMAL HIGH (ref 1.005–1.030)
Urobilinogen, UA: 1 mg/dL (ref 0.0–1.0)
pH: 5.5 (ref 5.0–8.0)

## 2015-01-26 LAB — URINE MICROSCOPIC-ADD ON

## 2015-01-26 MED ORDER — HYDROCODONE-ACETAMINOPHEN 5-325 MG PO TABS
2.0000 | ORAL_TABLET | Freq: Once | ORAL | Status: AC
  Administered 2015-01-26: 2 via ORAL
  Filled 2015-01-26: qty 2

## 2015-01-26 MED ORDER — IOHEXOL 300 MG/ML  SOLN
50.0000 mL | Freq: Once | INTRAMUSCULAR | Status: AC | PRN
  Administered 2015-01-26: 50 mL via ORAL

## 2015-01-26 MED ORDER — SODIUM CHLORIDE 0.9 % IV BOLUS (SEPSIS)
1000.0000 mL | Freq: Once | INTRAVENOUS | Status: AC
Start: 2015-01-26 — End: 2015-01-26
  Administered 2015-01-26: 1000 mL via INTRAVENOUS

## 2015-01-26 MED ORDER — ONDANSETRON 4 MG PO TBDP
4.0000 mg | ORAL_TABLET | Freq: Once | ORAL | Status: AC
  Administered 2015-01-26: 4 mg via ORAL
  Filled 2015-01-26: qty 1

## 2015-01-26 MED ORDER — IOHEXOL 300 MG/ML  SOLN
100.0000 mL | Freq: Once | INTRAMUSCULAR | Status: AC | PRN
  Administered 2015-01-26: 100 mL via INTRAVENOUS

## 2015-01-26 NOTE — ED Notes (Signed)
Pt aware that a urine sample is needed and was given a urinal but sts he is unable at this timeand informed to press call bell after he was finished.

## 2015-01-26 NOTE — ED Provider Notes (Signed)
CSN: [id number]     Arrival date & time 01/25/15  2151 History   First MD Initiated Contact with Patient 01/26/15 0258     Chief Complaint  Patient presents with  . Abdominal Pain  . Seizures     (Consider location/radiation/quality/duration/timing/severity/associated sxs/prior Treatment) HPI Greg Morales is a 29 y.o. male with past medical history of polysubstance abuse, seizures, chronic abdominal pain presenting today with abdominal pain. Patient states she's having suprapubic abdominal pain that is causing him to have more seizures. He's had several seizures over the past 2 days. Patient refuses to elaborate or talk to me further. He is a poor historian. He admits to subjective fevers. Patient states this occurred to him before in the past in [place] but does not know the details of his abdominal pain or what was done. He states he's been compliant with his Keppra.   Past Medical History  Diagnosis Date  . Chronic abdominal pain   . Medically noncompliant   . Polysubstance abuse   . Seizures     since childhood   Past Surgical History  Procedure Laterality Date  . Esophagogastroduodenoscopy     Family History  Problem Relation Age of Onset  . Hypertension Other   . Seizures Other    History  Substance Use Topics  . Smoking status: Current Every Day Smoker -- 2.00 packs/day for 5 years    Types: Cigarettes  . Smokeless tobacco: Not on file  . Alcohol Use: 0.0 oz/week     Comment: occasionally    Review of Systems  Unable to perform ROS: Other      Allergies  Review of patient's allergies indicates no known allergies.  Home Medications   Prior to Admission medications   Medication Sig Start Date End Date Taking? Authorizing Provider  hydrOXYzine (ATARAX/VISTARIL) 50 MG tablet Take 50-100 mg by mouth 2 (two) times daily. Takes  in the morning and  in the evening.   Yes Historical Provider, MD  levETIRAcetam (KEPPRA) 500 MG tablet Take 500 mg by  mouth 3 (three) times daily.   Yes Historical Provider, MD  sertraline (ZOLOFT) 50 MG tablet Take 50 mg by mouth daily.   Yes Historical Provider, MD  acetaminophen-codeine 120-12 MG/5ML solution Take 10 mLs by mouth every 4 (four) hours as needed for moderate pain. Patient not taking: Reported on 01/26/2015 09/23/13   Charlestine Night, PA-C  dexamethasone (DECADRON) 0.1 % ophthalmic suspension Place 1 drop into both eyes 2 (two) times daily. For 4 days only. Patient not taking: Reported on 01/26/2015 10/12/13   Hayden Rasmussen, NP  Guaifenesin 1200 MG TB12 Take 1 tablet (1,200 mg total) by mouth 2 (two) times daily. Patient not taking: Reported on 01/26/2015 09/23/13   Charlestine Night, PA-C  ibuprofen (ADVIL,MOTRIN) 800 MG tablet Take 1 tablet (800 mg total) by mouth every 8 (eight) hours as needed. Patient not taking: Reported on 01/26/2015 09/23/13   Charlestine Night, PA-C  meloxicam (MOBIC) 7.5 MG tablet Take 1 tablet (7.5 mg total) by mouth daily. Patient not taking: Reported on 01/26/2015 06/15/14   April Palumbo, MD  tobramycin (TOBREX) 0.3 % ophthalmic solution Place 1 drop into both eyes every 4 (four) hours. X 5 days Patient not taking: Reported on 01/26/2015 10/12/13   Hayden Rasmussen, NP   BP 134/93 mmHg  Pulse 93  Resp 16  SpO2 98% Physical Exam  Constitutional: He is oriented to person, place, and time. Vital signs are normal. He appears well-developed  and well-nourished.  Non-toxic appearance. He does not appear ill. No distress.  HENT:  Head: Normocephalic and atraumatic.  Nose: Nose normal.  Mouth/Throat: Oropharynx is clear and moist. No oropharyngeal exudate.  Eyes: Conjunctivae and EOM are normal. Pupils are equal, round, and reactive to light. No scleral icterus.  Neck: Normal range of motion. Neck supple. No tracheal deviation, no edema, no erythema and normal range of motion present. No thyroid mass and no thyromegaly present.  Cardiovascular: Normal rate, regular rhythm, S1  normal, S2 normal, normal heart sounds, intact distal pulses and normal pulses.  Exam reveals no gallop and no friction rub.   No murmur heard. Pulses:      Radial pulses are 2+ on the right side, and 2+ on the left side.       Dorsalis pedis pulses are 2+ on the right side, and 2+ on the left side.  Pulmonary/Chest: Effort normal and breath sounds normal. No respiratory distress. He has no wheezes. He has no rhonchi. He has no rales.  Abdominal: Soft. Normal appearance and bowel sounds are normal. He exhibits no distension, no ascites and no mass. There is no hepatosplenomegaly. There is tenderness. There is no rebound, no guarding and no CVA tenderness.  Suprapubic tenderness to palpation.  Musculoskeletal: Normal range of motion. He exhibits no edema or tenderness.  Lymphadenopathy:    He has no cervical adenopathy.  Neurological: He is alert and oriented to person, place, and time. He has normal strength. No cranial nerve deficit or sensory deficit.  Skin: Skin is warm, dry and intact. No petechiae and no rash noted. He is not diaphoretic. No erythema. No pallor.  Nursing note and vitals reviewed.   ED Course  Procedures (including critical care time) Labs Review Labs Reviewed  CBC WITH DIFFERENTIAL/PLATELET - Abnormal; Notable for the following:    WBC 13.1 (*)    Neutrophils Relative % 80 (*)    Neutro Abs 10.4 (*)    All other components within normal limits  COMPREHENSIVE METABOLIC PANEL - Abnormal; Notable for the following:    Glucose, Bld 132 (*)    Creatinine, Ser 1.38 (*)    Total Protein 8.8 (*)    Total Bilirubin 1.4 (*)    GFR calc non Af Amer 67 (*)    GFR calc Af Amer 78 (*)    All other components within normal limits  URINALYSIS, ROUTINE W REFLEX MICROSCOPIC - Abnormal; Notable for the following:    Color, Urine AMBER (*)    Specific Gravity, Urine >1.046 (*)    Protein, ur 100 (*)    All other components within normal limits  LIPASE, BLOOD  URINE  MICROSCOPIC-ADD ON    Imaging Review Ct Abdomen Pelvis W Contrast  01/26/2015   CLINICAL DATA:  Subacute onset of generalized abdominal pain and seizures. Dry heaves. Initial encounter.  EXAM: CT ABDOMEN AND PELVIS WITH CONTRAST  TECHNIQUE: Multidetector CT imaging of the abdomen and pelvis was performed using the standard protocol following bolus administration of intravenous contrast.  CONTRAST:  OMNIPAQUE IOHEXOL 300 MG/ML  SOLN  COMPARISON:  CT of the abdomen and pelvis performed 12/24/2011  FINDINGS: The visualized lung bases are clear.  The liver and spleen are unremarkable in appearance. The gallbladder is within normal limits. The pancreas and adrenal glands are unremarkable.  The kidneys are unremarkable in appearance. There is no evidence of hydronephrosis. No renal or ureteral stones are seen. No perinephric stranding is appreciated.  No free  fluid is identified. The small bowel is unremarkable in appearance. The stomach is within normal limits. No acute vascular abnormalities are seen.  The appendix is normal in caliber, without evidence for appendicitis. The colon is unremarkable in appearance.  The bladder is mildly distended and grossly unremarkable. The prostate remains normal in size. No inguinal lymphadenopathy is seen.  No acute osseous abnormalities are identified. A chronic left-sided pars defect is noted at L5.  IMPRESSION: 1. No acute abnormality seen within the abdomen or pelvis. 2. 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   Electronically Signed   By: Roanna RaiderJeffery  Chang M.D.   On: 01/26/2015 04:34     EKG Interpretation None      MDM   Final diagnoses:  Abdominal pain    Patient presents emergency department for abdominal pain. He has a documented history of chronic abdominal pain however imaging has not been done in several years. I will obtain laboratory studies to assess his abdominal pain and CT scan as he has focal tenderness in the suprapubic area. Patient cannot give  me his own medical history.  CT scan does not show any intra-abdominal pathology. Laboratory studies are unremarkable. Urinalysis is pending for possible infection.  Patient continues to appear in no acute distress. Vital signs remain within normal limits and he is safe for discharge. If urinalysis shows infection will send home with antibiotics.   Urinalysis is negative. Vital signs were within his normal limits patient safe for discharge.    Tomasita CrumbleAdeleke Jeraldin Fesler, MD 01/26/15 [id number]

## 2015-01-26 NOTE — Discharge Instructions (Signed)
Abdominal Pain Greg Morales, your CT scan did not show any cause for your pain. See a primary care physician within 3 days for follow-up for your seizures and abdominal pain. If any symptoms worsen come back to the emergency department immediately. Thank you. Many things can cause belly (abdominal) pain. Most times, the belly pain is not dangerous. Many cases of belly pain can be watched and treated at home. HOME CARE   Do not take medicines that help you go poop (laxatives) unless told to by your doctor.  Only take medicine as told by your doctor.  Eat or drink as told by your doctor. Your doctor will tell you if you should be on a special diet. GET HELP IF:  You do not know what is causing your belly pain.  You have belly pain while you are sick to your stomach (nauseous) or have runny poop (diarrhea).  You have pain while you pee or poop.  Your belly pain wakes you up at night.  You have belly pain that gets worse or better when you eat.  You have belly pain that gets worse when you eat fatty foods.  You have a fever. GET HELP RIGHT AWAY IF:   The pain does not go away within 2 hours.  You keep throwing up (vomiting).  The pain changes and is only in the right or left part of the belly.  You have bloody or tarry looking poop. MAKE SURE YOU:   Understand these instructions.  Will watch your condition.  Will get help right away if you are not doing well or get worse. Document Released: 03/02/2008 Document Revised: 09/19/2013 Document Reviewed: 05/24/2013 [facility] Patient Information 2015 [place], [place]. This information is not intended to replace advice given to you by your health care provider. Make sure you discuss any questions you have with your health care provider. Epilepsy People with epilepsy have times when they shake and jerk uncontrollably (seizures). This happens when there is a sudden change in brain function. Epilepsy may have many possible causes. Anything  that disturbs the normal pattern of brain cell activity can lead to seizures. HOME CARE   Follow your doctor's instructions about driving and safety during normal activities.  Get enough sleep.  Only take medicine as told by your doctor.  Avoid things that you know can cause you to have seizures (triggers).  Write down when your seizures happen and what you remember about each seizure. Write down anything you think may have caused the seizure to happen.  Tell the people you live and work with that you have seizures. Make sure they know how to help you. They should:  Cushion your head and body.  Turn you on your side.  Not restrain you.  Not place anything inside your mouth.  Call for local emergency medical help if there is any question about what has happened.  Keep all follow-up visits with your doctor. This is very important. GET HELP IF:  You get an infection or start to feel sick. You may have more seizures when you are sick.  You are having seizures more often.  Your seizure pattern is changing. GET HELP RIGHT AWAY IF:   A seizure does not stop after a few seconds or minutes.  A seizure causes you to have trouble breathing.  A seizure gives you a very bad headache.  A seizure makes you unable to speak or use a part of your body. Document Released: 07/12/2009 Document Revised: 07/05/2013 Document  Reviewed: 04/26/2013 ExitCare Patient Information 2015 [place], [place]. This information is not intended to replace advice given to you by your health care provider. Make sure you discuss any questions you have with your health care provider.

## 2015-01-26 NOTE — ED Notes (Signed)
Per Dr. Mora Bellmanni, no re-draw on CBC. Didn't realize one was ordered already

## 2015-01-26 NOTE — ED Notes (Signed)
Pt freely voided approximately 450 ml of amber urine.

## 2015-01-27 ENCOUNTER — Encounter (HOSPITAL_COMMUNITY): Payer: Self-pay | Admitting: Emergency Medicine

## 2015-01-27 ENCOUNTER — Emergency Department (HOSPITAL_COMMUNITY): Payer: Medicaid - Out of State

## 2015-01-27 ENCOUNTER — Emergency Department (HOSPITAL_COMMUNITY)
Admission: EM | Admit: 2015-01-27 | Discharge: 2015-01-27 | Disposition: A | Discharge status: Home / Self Care | Payer: Self-pay | Attending: Emergency Medicine | Admitting: Emergency Medicine

## 2015-01-27 DIAGNOSIS — Z79899 Other long term (current) drug therapy: Secondary | ICD-10-CM | POA: Insufficient documentation

## 2015-01-27 DIAGNOSIS — F1210 Cannabis abuse, uncomplicated: Secondary | ICD-10-CM | POA: Insufficient documentation

## 2015-01-27 DIAGNOSIS — Z792 Long term (current) use of antibiotics: Secondary | ICD-10-CM | POA: Insufficient documentation

## 2015-01-27 DIAGNOSIS — G40909 Epilepsy, unspecified, not intractable, without status epilepticus: Secondary | ICD-10-CM | POA: Insufficient documentation

## 2015-01-27 DIAGNOSIS — G8929 Other chronic pain: Secondary | ICD-10-CM | POA: Insufficient documentation

## 2015-01-27 DIAGNOSIS — F1110 Opioid abuse, uncomplicated: Secondary | ICD-10-CM | POA: Insufficient documentation

## 2015-01-27 DIAGNOSIS — Z9119 Patient's noncompliance with other medical treatment and regimen: Secondary | ICD-10-CM | POA: Insufficient documentation

## 2015-01-27 DIAGNOSIS — R1084 Generalized abdominal pain: Secondary | ICD-10-CM | POA: Insufficient documentation

## 2015-01-27 DIAGNOSIS — R112 Nausea with vomiting, unspecified: Secondary | ICD-10-CM | POA: Insufficient documentation

## 2015-01-27 DIAGNOSIS — R197 Diarrhea, unspecified: Secondary | ICD-10-CM | POA: Insufficient documentation

## 2015-01-27 DIAGNOSIS — Z720 Tobacco use: Secondary | ICD-10-CM | POA: Insufficient documentation

## 2015-01-27 DIAGNOSIS — F1410 Cocaine abuse, uncomplicated: Secondary | ICD-10-CM | POA: Insufficient documentation

## 2015-01-27 LAB — COMPREHENSIVE METABOLIC PANEL
ALK PHOS: 63 U/L (ref 38–126)
ALT: 38 U/L (ref 17–63)
AST: 37 U/L (ref 15–41)
Albumin: 4.3 g/dL (ref 3.5–5.0)
Anion gap: 13 (ref 5–15)
BILIRUBIN TOTAL: 0.9 mg/dL (ref 0.3–1.2)
BUN: 17 mg/dL (ref 6–20)
CHLORIDE: 103 mmol/L (ref 101–111)
CO2: 25 mmol/L (ref 22–32)
Calcium: 10.1 mg/dL (ref 8.9–10.3)
Creatinine, Ser: 1.48 mg/dL — ABNORMAL HIGH (ref 0.61–1.24)
GFR calc non Af Amer: >60 mL/min (ref >60)
GLUCOSE: 126 mg/dL — AB (ref 70–99)
POTASSIUM: 3.5 mmol/L (ref 3.5–5.1)
Sodium: 141 mmol/L (ref 135–145)
Total Protein: 8 g/dL (ref 6.5–8.1)

## 2015-01-27 LAB — CBC WITH DIFFERENTIAL/PLATELET
BASOS ABS: 0 10*3/uL (ref 0.0–0.1)
Basophils Relative: 0 % (ref 0–1)
Eosinophils Absolute: 0 10*3/uL (ref 0.0–0.7)
Eosinophils Relative: 0 % (ref 0–5)
HCT: 46.7 % (ref 39.0–52.0)
HEMOGLOBIN: 16.1 g/dL (ref 13.0–17.0)
Lymphocytes Relative: 12 % (ref 12–46)
Lymphs Abs: 1.7 10*3/uL (ref 0.7–4.0)
MCH: 31.7 pg (ref 26.0–34.0)
MCHC: 34.5 g/dL (ref 30.0–36.0)
MCV: 91.9 fL (ref 78.0–100.0)
MONO ABS: 0.4 10*3/uL (ref 0.1–1.0)
MONOS PCT: 3 % (ref 3–12)
NEUTROS ABS: 12.4 10*3/uL — AB (ref 1.7–7.7)
Neutrophils Relative %: 85 % — ABNORMAL HIGH (ref 43–77)
Platelets: 330 10*3/uL (ref 150–400)
RBC: 5.08 MIL/uL (ref 4.22–5.81)
RDW: 13 % (ref 11.5–15.5)
WBC: 14.6 10*3/uL — AB (ref 4.0–10.5)

## 2015-01-27 LAB — URINALYSIS, ROUTINE W REFLEX MICROSCOPIC
Bilirubin Urine: NEGATIVE
Glucose, UA: NEGATIVE mg/dL
HGB URINE DIPSTICK: NEGATIVE
KETONES UR: 40 mg/dL — AB
Nitrite: NEGATIVE
PROTEIN: 30 mg/dL — AB
Specific Gravity, Urine: 1.034 — ABNORMAL HIGH (ref 1.005–1.030)
Urobilinogen, UA: 1 mg/dL (ref 0.0–1.0)
pH: 5.5 (ref 5.0–8.0)

## 2015-01-27 LAB — URINE MICROSCOPIC-ADD ON

## 2015-01-27 LAB — RAPID URINE DRUG SCREEN, HOSP PERFORMED
Amphetamines: NOT DETECTED
BARBITURATES: NOT DETECTED
BENZODIAZEPINES: NOT DETECTED
Cocaine: POSITIVE — AB
Opiates: POSITIVE — AB
Tetrahydrocannabinol: POSITIVE — AB

## 2015-01-27 LAB — LIPASE, BLOOD: Lipase: 91 U/L — ABNORMAL HIGH (ref 22–51)

## 2015-01-27 LAB — CBG MONITORING, ED: Glucose-Capillary: 115 mg/dL — ABNORMAL HIGH (ref 70–99)

## 2015-01-27 MED ORDER — DICYCLOMINE HCL 20 MG PO TABS: 20.0000 mg | ORAL_TABLET | Freq: Two times a day (BID) | ORAL | Status: DC

## 2015-01-27 MED ORDER — MORPHINE SULFATE 4 MG/ML IJ SOLN
4.0000 mg | Freq: Once | INTRAMUSCULAR | Status: AC
  Administered 2015-01-27: 4 mg via INTRAVENOUS
  Filled 2015-01-27: qty 1

## 2015-01-27 MED ORDER — SODIUM CHLORIDE 0.9 % IV BOLUS (SEPSIS)
1000.0000 mL | Freq: Once | INTRAVENOUS | Status: AC
  Administered 2015-01-27: 1000 mL via INTRAVENOUS

## 2015-01-27 MED ORDER — ONDANSETRON HCL 4 MG/2ML IJ SOLN
4.0000 mg | Freq: Once | INTRAMUSCULAR | Status: AC
  Administered 2015-01-27: 4 mg via INTRAVENOUS
  Filled 2015-01-27: qty 2

## 2015-01-27 MED ORDER — FAMOTIDINE 20 MG PO TABS: 20.0000 mg | ORAL_TABLET | Freq: Two times a day (BID) | ORAL | Status: DC

## 2015-01-27 MED ORDER — SODIUM CHLORIDE 0.9 % IV SOLN
1000.0000 mL | Freq: Once | INTRAVENOUS | Status: AC
  Administered 2015-01-27: 1000 mL via INTRAVENOUS

## 2015-01-27 MED ORDER — METOCLOPRAMIDE HCL 10 MG PO TABS: 10.0000 mg | ORAL_TABLET | Freq: Four times a day (QID) | ORAL | Status: DC | PRN

## 2015-01-27 NOTE — ED Notes (Signed)
Pt d/c'd from IV, continuous pulse oximetry and blood pressure cuff; pt being discharged home; visitor at bedside

## 2015-01-27 NOTE — ED Notes (Signed)
Assisted Janie, RN who performed an in and out cath on pt without success

## 2015-01-27 NOTE — Discharge Instructions (Signed)

## 2015-01-27 NOTE — ED Provider Notes (Signed)
CSN: [id number]     Arrival date & time 01/27/15  0845 History   First MD Initiated Contact with Patient 01/27/15 [id number]     Chief Complaint  Patient presents with  . Abdominal Pain     (Consider location/radiation/quality/duration/timing/severity/associated sxs/prior Treatment) HPI Comments: Patient presents with abdominal pain. He states it's been hurting over the last week but is gotten worse over the last 2 days. He mostly describes pain across his lower abdomen. He states these had ongoing pain for "a minute".  He used to see a gastroenterologist in [place] but it's been several years since he saw that physician. He had an endoscopy where he states that they had to have something scraped off. He doesn't know what else they did. He wasn't put on any medications. He currently is not taking any medications for his abdominal pain. He doesn't have a local physician that he sees. He describes it as a crampy pain that is constant but waxes and wanes in intensity. He has associated nausea and vomiting. He also has some loose stools. He denies any known fevers. He denies any urinary difficulty. He denies any blood in the stools or emesis.  Patient is a 29 y.o. male presenting with abdominal pain.  Abdominal Pain Associated symptoms: diarrhea, nausea and vomiting   Associated symptoms: no chest pain, no chills, no cough, no fatigue, no fever, no hematuria and no shortness of breath     Past Medical History  Diagnosis Date  . Chronic abdominal pain   . Medically noncompliant   . Polysubstance abuse   . Seizures     since childhood   Past Surgical History  Procedure Laterality Date  . Esophagogastroduodenoscopy     Family History  Problem Relation Age of Onset  . Hypertension Other   . Seizures Other    History  Substance Use Topics  . Smoking status: Current Every Day Smoker -- 2.00 packs/day for 5 years    Types: Cigarettes  . Smokeless tobacco: Not on file  . Alcohol Use: 0.0  oz/week     Comment: occasionally    Review of Systems  Constitutional: Negative for fever, chills, diaphoresis and fatigue.  HENT: Negative for congestion, rhinorrhea and sneezing.   Eyes: Negative.   Respiratory: Negative for cough, chest tightness and shortness of breath.   Cardiovascular: Negative for chest pain and leg swelling.  Gastrointestinal: Positive for nausea, vomiting, abdominal pain and diarrhea. Negative for blood in stool.  Genitourinary: Negative for frequency, hematuria, flank pain and difficulty urinating.  Musculoskeletal: Negative for back pain and arthralgias.  Skin: Negative for rash.  Neurological: Negative for dizziness, speech difficulty, weakness, numbness and headaches.      Allergies  Review of patient's allergies indicates no known allergies.  Home Medications   Prior to Admission medications   Medication Sig Start Date End Date Taking? Authorizing Provider  sertraline (ZOLOFT) 50 MG tablet Take 50 mg by mouth daily.   Yes Historical Provider, MD  acetaminophen-codeine 120-12 MG/5ML solution Take 10 mLs by mouth every 4 (four) hours as needed for moderate pain. Patient not taking: Reported on 01/26/2015 09/23/13   Charlestine Night, PA-C  dexamethasone (DECADRON) 0.1 % ophthalmic suspension Place 1 drop into both eyes 2 (two) times daily. For 4 days only. Patient not taking: Reported on 01/26/2015 10/12/13   Hayden Rasmussen, NP  dicyclomine (BENTYL) 20 MG tablet Take 1 tablet (20 mg total) by mouth 2 (two) times daily. 01/27/15   Rolan Bucco, MD  famotidine (PEPCID) 20 MG tablet Take 1 tablet (20 mg total) by mouth 2 (two) times daily. 01/27/15   Rolan BuccoMelanie Beanca Kiester, MD  Guaifenesin 1200 MG TB12 Take 1 tablet (1,200 mg total) by mouth 2 (two) times daily. Patient not taking: Reported on 01/26/2015 09/23/13   Charlestine Nighthristopher Lawyer, PA-C  hydrOXYzine (ATARAX/VISTARIL) 50 MG tablet Take 50-100 mg by mouth 2 (two) times daily. Takes 50mg  in the morning and 100mg  in the  evening.    Historical Provider, MD  ibuprofen (ADVIL,MOTRIN) 800 MG tablet Take 1 tablet (800 mg total) by mouth every 8 (eight) hours as needed. Patient not taking: Reported on 01/26/2015 09/23/13   Charlestine Nighthristopher Lawyer, PA-C  levETIRAcetam (KEPPRA) 500 MG tablet Take 500 mg by mouth 3 (three) times daily.    Historical Provider, MD  meloxicam (MOBIC) 7.5 MG tablet Take 1 tablet (7.5 mg total) by mouth daily. Patient not taking: Reported on 01/26/2015 06/15/14   April Palumbo, MD  metoCLOPramide (REGLAN) 10 MG tablet Take 1 tablet (10 mg total) by mouth every 6 (six) hours as needed for nausea (nausea/headache). 01/27/15   Rolan BuccoMelanie Euretha Najarro, MD  tobramycin (TOBREX) 0.3 % ophthalmic solution Place 1 drop into both eyes every 4 (four) hours. X 5 days Patient not taking: Reported on 01/26/2015 10/12/13   Hayden Rasmussenavid Mabe, NP   BP 107/67 mmHg  Pulse 73  Temp(Src) 99.7 F (37.6 C) (Rectal)  Resp 18  SpO2 98% Physical Exam  Constitutional: He is oriented to person, place, and time. He appears well-developed and well-nourished. He appears distressed.  HENT:  Head: Normocephalic and atraumatic.  Eyes: Pupils are equal, round, and reactive to light.  Neck: Normal range of motion. Neck supple.  Cardiovascular: Normal rate, regular rhythm and normal heart sounds.   Pulmonary/Chest: Effort normal and breath sounds normal. No respiratory distress. He has no wheezes. He has no rales. He exhibits no tenderness.  Abdominal: Soft. Bowel sounds are normal. There is tenderness (moderate diffuse tenderness across the abdomen). There is no rebound and no guarding.  Musculoskeletal: Normal range of motion. He exhibits no edema.  Lymphadenopathy:    He has no cervical adenopathy.  Neurological: He is alert and oriented to person, place, and time.  Skin: Skin is warm and dry. No rash noted.  Psychiatric: He has a normal mood and affect.    ED Course  Procedures (including critical care time) Labs Review Labs Reviewed   CBC WITH DIFFERENTIAL/PLATELET - Abnormal; Notable for the following:    WBC 14.6 (*)    Neutrophils Relative % 85 (*)    Neutro Abs 12.4 (*)    All other components within normal limits  COMPREHENSIVE METABOLIC PANEL - Abnormal; Notable for the following:    Glucose, Bld 126 (*)    Creatinine, Ser 1.48 (*)    All other components within normal limits  LIPASE, BLOOD - Abnormal; Notable for the following:    Lipase 91 (*)    All other components within normal limits  URINALYSIS, ROUTINE W REFLEX MICROSCOPIC - Abnormal; Notable for the following:    Color, Urine AMBER (*)    Specific Gravity, Urine 1.034 (*)    Ketones, ur 40 (*)    Protein, ur 30 (*)    Leukocytes, UA SMALL (*)    All other components within normal limits  URINE RAPID DRUG SCREEN (HOSP PERFORMED) - Abnormal; Notable for the following:    Opiates POSITIVE (*)    Cocaine POSITIVE (*)    Tetrahydrocannabinol POSITIVE (*)  All other components within normal limits  URINE MICROSCOPIC-ADD ON - Abnormal; Notable for the following:    Bacteria, UA FEW (*)    All other components within normal limits  CBG MONITORING, ED - Abnormal; Notable for the following:    Glucose-Capillary 115 (*)    All other components within normal limits    Imaging Review Ct Abdomen Pelvis W Contrast  01/26/2015   CLINICAL DATA:  Subacute onset of generalized abdominal pain and seizures. Dry heaves. Initial encounter.  EXAM: CT ABDOMEN AND PELVIS WITH CONTRAST  TECHNIQUE: Multidetector CT imaging of the abdomen and pelvis was performed using the standard protocol following bolus administration of intravenous contrast.  CONTRAST:  OMNIPAQUE IOHEXOL 300 MG/ML  SOLN  COMPARISON:  CT of the abdomen and pelvis performed 12/24/2011  FINDINGS: The visualized lung bases are clear.  The liver and spleen are unremarkable in appearance. The gallbladder is within normal limits. The pancreas and adrenal glands are unremarkable.  The kidneys are  unremarkable in appearance. There is no evidence of hydronephrosis. No renal or ureteral stones are seen. No perinephric stranding is appreciated.  No free fluid is identified. The small bowel is unremarkable in appearance. The stomach is within normal limits. No acute vascular abnormalities are seen.  The appendix is normal in caliber, without evidence for appendicitis. The colon is unremarkable in appearance.  The bladder is mildly distended and grossly unremarkable. The prostate remains normal in size. No inguinal lymphadenopathy is seen.  No acute osseous abnormalities are identified. A chronic left-sided pars defect is noted at L5.  IMPRESSION: 1. No acute abnormality seen within the abdomen or pelvis. 2. 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   Electronically Signed   By: Roanna Raider M.D.   On: 01/26/2015 04:34   Dg Abd Acute W/chest  01/27/2015   CLINICAL DATA:  Belly pain since last week off and on. Diaphoresis and vomiting. Dysuria and seizure.  EXAM: DG ABDOMEN ACUTE W/ 1V CHEST  COMPARISON:  09/23/2013  FINDINGS: Lungs are mildly hyperinflated. There is perihilar peribronchial thickening. There are no focal consolidations or pleural effusions. Bowel gas pattern is nonobstructive. Contrast is identified within the colon. No free intraperitoneal air. No organomegaly. Visualized osseous structures have a normal appearance.  IMPRESSION: 1. Mild hyperinflation bronchitic changes. 2.  No evidence for acute cardiopulmonary abnormality. 3. Nonobstructive bowel gas pattern.   Electronically Signed   By: Norva Pavlov M.D.   On: 01/27/2015 10:29     EKG Interpretation None      MDM   Final diagnoses:  Generalized abdominal pain    Patient presents with generalized abdominal pain. Sounds fairly chronic for him. He was seen a gastroenterologist in [place] but currently does not have a physician. He had a CT scan done yesterday which was unremarkable. He has no evidence of obstruction today. His  labs are unremarkable. He did have a mild elevation in his lipase and was given some pain medication in the ED. His urine drug screen was positive for cocaine and opiates and marijuana. However he did get a dose of morphine in the ED prior to this drug screen. He was discharged home with prescriptions for Pepcid, Reglan and Bentyl. He was given an outpatient referral to follow-up with gastroenterology.    Rolan Bucco, MD 01/27/15 [phone]

## 2015-01-27 NOTE — ED Notes (Addendum)
Belly pain for since last week on and off; diaphoresis; vomiting x 10; seizure d/o; [facility] and sent [facility]. Pt is rolling around bed and moaning.

## 2015-01-27 NOTE — ED Notes (Signed)
Vital signs stable. 

## 2015-01-27 NOTE — ED Notes (Signed)
Pt off unit at xray

## 2015-04-19 ENCOUNTER — Emergency Department (HOSPITAL_COMMUNITY)
Admission: EM | Admit: 2015-04-19 | Discharge: 2015-04-19 | Disposition: A | Discharge status: Home / Self Care | Payer: Medicaid - Out of State | Attending: Emergency Medicine | Admitting: Emergency Medicine

## 2015-04-19 ENCOUNTER — Emergency Department (HOSPITAL_COMMUNITY): Payer: Medicaid - Out of State

## 2015-04-19 ENCOUNTER — Encounter (HOSPITAL_COMMUNITY): Payer: Self-pay | Admitting: Emergency Medicine

## 2015-04-19 DIAGNOSIS — Z720 Tobacco use: Secondary | ICD-10-CM | POA: Insufficient documentation

## 2015-04-19 DIAGNOSIS — R569 Unspecified convulsions: Secondary | ICD-10-CM

## 2015-04-19 DIAGNOSIS — G8929 Other chronic pain: Secondary | ICD-10-CM | POA: Insufficient documentation

## 2015-04-19 DIAGNOSIS — Z79899 Other long term (current) drug therapy: Secondary | ICD-10-CM | POA: Insufficient documentation

## 2015-04-19 DIAGNOSIS — Z9119 Patient's noncompliance with other medical treatment and regimen: Secondary | ICD-10-CM | POA: Insufficient documentation

## 2015-04-19 DIAGNOSIS — G40909 Epilepsy, unspecified, not intractable, without status epilepticus: Secondary | ICD-10-CM | POA: Insufficient documentation

## 2015-04-19 LAB — BASIC METABOLIC PANEL
Anion gap: 9 (ref 5–15)
CALCIUM: 8.9 mg/dL (ref 8.9–10.3)
CO2: 29 mmol/L (ref 22–32)
Chloride: 104 mmol/L (ref 101–111)
Creatinine, Ser: 1.14 mg/dL (ref 0.61–1.24)
GFR calc non Af Amer: >60 mL/min (ref >60)
Glucose, Bld: 80 mg/dL (ref 65–99)
POTASSIUM: 3.6 mmol/L (ref 3.5–5.1)
Sodium: 142 mmol/L (ref 135–145)

## 2015-04-19 LAB — CBC WITH DIFFERENTIAL/PLATELET
BASOS PCT: 0 % (ref 0–1)
Basophils Absolute: 0 10*3/uL (ref 0.0–0.1)
Eosinophils Absolute: 0.1 10*3/uL (ref 0.0–0.7)
Eosinophils Relative: 2 % (ref 0–5)
HEMATOCRIT: 42.7 % (ref 39.0–52.0)
Hemoglobin: 14.3 g/dL (ref 13.0–17.0)
LYMPHS ABS: 2.7 10*3/uL (ref 0.7–4.0)
Lymphocytes Relative: 42 % (ref 12–46)
MCH: 31.2 pg (ref 26.0–34.0)
MCHC: 33.5 g/dL (ref 30.0–36.0)
MCV: 93.2 fL (ref 78.0–100.0)
MONO ABS: 0.4 10*3/uL (ref 0.1–1.0)
Monocytes Relative: 7 % (ref 3–12)
NEUTROS PCT: 49 % (ref 43–77)
Neutro Abs: 3.2 10*3/uL (ref 1.7–7.7)
Platelets: 285 10*3/uL (ref 150–400)
RBC: 4.58 MIL/uL (ref 4.22–5.81)
RDW: 13.4 % (ref 11.5–15.5)
WBC: 6.4 10*3/uL (ref 4.0–10.5)

## 2015-04-19 LAB — VALPROIC ACID LEVEL: VALPROIC ACID LVL: 59 ug/mL (ref 50.0–100.0)

## 2015-04-19 LAB — I-STAT CG4 LACTIC ACID, ED: Lactic Acid, Venous: 1.56 mmol/L (ref 0.5–2.0)

## 2015-04-19 LAB — CBG MONITORING, ED: GLUCOSE-CAPILLARY: 73 mg/dL (ref 65–99)

## 2015-04-19 MED ORDER — DIVALPROEX SODIUM 250 MG PO DR TAB
500.0000 mg | DELAYED_RELEASE_TABLET | Freq: Once | ORAL | Status: AC
  Administered 2015-04-19: 500 mg via ORAL
  Filled 2015-04-19: qty 2

## 2015-04-19 MED ORDER — LEVETIRACETAM 500 MG PO TABS
500.0000 mg | ORAL_TABLET | Freq: Once | ORAL | Status: AC
  Administered 2015-04-19: 500 mg via ORAL
  Filled 2015-04-19: qty 1

## 2015-04-19 MED ORDER — DOCUSATE SODIUM 100 MG PO CAPS
100.0000 mg | ORAL_CAPSULE | Freq: Once | ORAL | Status: AC
  Administered 2015-04-19: 100 mg via ORAL
  Filled 2015-04-19: qty 1

## 2015-04-19 NOTE — ED Notes (Signed)
Pharmacy called for missing keppra.

## 2015-04-19 NOTE — ED Notes (Signed)
Per EMS, patient comes from [facility] as an inmate for seizurelike activity. He was last seizing around midnight, grand mal, and also a seizure an hour and a half before that one. Hx of seizures, but he hasn't "had them for years". Law enforcement present on arrival, currently on SI watch. Vs with ems bp 138/70, p 62, o2 sat 98% on room air.

## 2015-04-19 NOTE — Discharge Instructions (Signed)
Seizure, Adult Greg Morales, you work today is normal.  Continue to take medication as prescribed and see neurology within 3 days for close follow up.  If symptoms worsen, come back to the ED immediately.  Thank you. A seizure means there is unusual activity in the brain. A seizure can cause changes in attention or behavior. Seizures often cause shaking (convulsions). Seizures often last from 30 seconds to 2 minutes. HOME CARE   If you are given medicines, take them exactly as told by your doctor.  Keep all doctor visits as told.  Do not swim or drive until your doctor says it is okay.  Teach others what to do if you have a seizure. They should:  Lay you on the ground.  Put a cushion under your head.  Loosen any tight clothing around your neck.  Turn you on your side.  Stay with you until you get better. GET HELP RIGHT AWAY IF:   The seizure lasts longer than 2 to 5 minutes.  The seizure is very bad.  The person does not wake up after the seizure.  The person's attention or behavior changes. Drive the person to the emergency room or call your local emergency services (911 in [place] MAKE SURE YOU:   Understand these instructions.  Will watch your condition.  Will get help right away if you are not doing well or get worse. Document Released: 03/02/2008 Document Revised: 12/07/2011 Document Reviewed: 09/02/2011 [facility] Patient Information 2015 [place], [place]. This information is not intended to replace advice given to you by your health care provider. Make sure you discuss any questions you have with your health care provider.

## 2015-04-19 NOTE — ED Provider Notes (Signed)
CSN: [id number]     Arrival date & time 04/19/15  0107 History  This chart was scribed for Greg Crumble, MD by Greg Morales, ED Scribe. This patient was seen in room D34C/D34C and the patient's care was started 1:25 AM.     Chief Complaint  Patient presents with  . Seizures    The history is provided by the patient. No language interpreter was used.     HPI Comments:  Greg Morales is a 29 y.o. male with a h/o seizures who presents to the Emergency Department s/p seizure last night (04/19/15). Per accompanying officers pt had 2 seizures 1 hour apart. Pt was sleeping prior to episodes. Pt states he has seizures ~once a month and that he has been having them more often due to stress. Pt takes Depakote and Keppra and is compliant. He reports associated generalized body aches at this time. He also reports  dry cough, and constipation for 8 days. Denies fever. No modifying factors noted.    Past Medical History  Diagnosis Date  . Chronic abdominal pain   . Medically noncompliant   . Polysubstance abuse   . Seizures     since childhood   Past Surgical History  Procedure Laterality Date  . Esophagogastroduodenoscopy     Family History  Problem Relation Age of Onset  . Hypertension Other   . Seizures Other    History  Substance Use Topics  . Smoking status: Current Every Day Smoker -- 2.00 packs/day for 5 years    Types: Cigarettes  . Smokeless tobacco: Not on file  . Alcohol Use: 0.0 oz/week     Comment: occasionally    Review of Systems  A complete 10 system review of systems was obtained and all systems are negative except as noted in the HPI and PMH.    Allergies  Review of patient's allergies indicates no known allergies.  Home Medications   Prior to Admission medications   Medication Sig Start Date End Date Taking? Authorizing Provider  acetaminophen-codeine 120-12 MG/5ML solution Take 10 mLs by mouth every 4 (four) hours as needed for moderate pain. Patient not  taking: Reported on 01/26/2015 09/23/13   Charlestine Night, PA-C  dexamethasone (DECADRON) 0.1 % ophthalmic suspension Place 1 drop into both eyes 2 (two) times daily. For 4 days only. Patient not taking: Reported on 01/26/2015 10/12/13   Hayden Rasmussen, NP  dicyclomine (BENTYL) 20 MG tablet Take 1 tablet (20 mg total) by mouth 2 (two) times daily. 01/27/15   Rolan Bucco, MD  famotidine (PEPCID) 20 MG tablet Take 1 tablet (20 mg total) by mouth 2 (two) times daily. 01/27/15   Rolan Bucco, MD  Guaifenesin 1200 MG TB12 Take 1 tablet (1,200 mg total) by mouth 2 (two) times daily. Patient not taking: Reported on 01/26/2015 09/23/13   Charlestine Night, PA-C  hydrOXYzine (ATARAX/VISTARIL) 50 MG tablet Take 50-100 mg by mouth 2 (two) times daily. Takes 50mg  in the morning and 100mg  in the evening.    Historical Provider, MD  ibuprofen (ADVIL,MOTRIN) 800 MG tablet Take 1 tablet (800 mg total) by mouth every 8 (eight) hours as needed. Patient not taking: Reported on 01/26/2015 09/23/13   Charlestine Night, PA-C  levETIRAcetam (KEPPRA) 500 MG tablet Take 500 mg by mouth 3 (three) times daily.    Historical Provider, MD  meloxicam (MOBIC) 7.5 MG tablet Take 1 tablet (7.5 mg total) by mouth daily. Patient not taking: Reported on 01/26/2015 06/15/14   April Palumbo, MD  metoCLOPramide (REGLAN) 10 MG tablet Take 1 tablet (10 mg total) by mouth every 6 (six) hours as needed for nausea (nausea/headache). 01/27/15   Rolan Bucco, MD  sertraline (ZOLOFT) 50 MG tablet Take 50 mg by mouth daily.    Historical Provider, MD  tobramycin (TOBREX) 0.3 % ophthalmic solution Place 1 drop into both eyes every 4 (four) hours. X 5 days Patient not taking: Reported on 01/26/2015 10/12/13   Hayden Rasmussen, NP   BP 135/87 mmHg  Pulse 62  Temp(Src) 98.2 F (36.8 C) (Oral)  Resp 14  SpO2 100% Physical Exam  Constitutional: He is oriented to person, place, and time. Vital signs are normal. He appears well-developed and well-nourished.   Non-toxic appearance. He does not appear ill. No distress.  HENT:  Head: Normocephalic and atraumatic.  Nose: Nose normal.  Mouth/Throat: Oropharynx is clear and moist. No oropharyngeal exudate.  Eyes: Conjunctivae and EOM are normal. Pupils are equal, round, and reactive to light. No scleral icterus.  Neck: Normal range of motion. Neck supple. No tracheal deviation, no edema, no erythema and normal range of motion present. No thyroid mass and no thyromegaly present.  Cardiovascular: Normal rate, regular rhythm, S1 normal, S2 normal, normal heart sounds, intact distal pulses and normal pulses.  Exam reveals no gallop and no friction rub.   No murmur heard. Pulses:      Radial pulses are 2+ on the right side, and 2+ on the left side.       Dorsalis pedis pulses are 2+ on the right side, and 2+ on the left side.  Pulmonary/Chest: Effort normal and breath sounds normal. No respiratory distress. He has no wheezes. He has no rhonchi. He has no rales.  Abdominal: Soft. Normal appearance and bowel sounds are normal. He exhibits no distension, no ascites and no mass. There is no hepatosplenomegaly. There is no tenderness. There is no rebound, no guarding and no CVA tenderness.  Musculoskeletal: Normal range of motion. He exhibits no edema or tenderness.  Lymphadenopathy:    He has no cervical adenopathy.  Neurological: He is alert and oriented to person, place, and time. He has normal strength. No cranial nerve deficit or sensory deficit.  Skin: Skin is warm, dry and intact. No petechiae and no rash noted. He is not diaphoretic. No erythema. No pallor.  Psychiatric: He has a normal mood and affect. His behavior is normal. Judgment normal.  Nursing note and vitals reviewed.   ED Course  Procedures   DIAGNOSTIC STUDIES:  Oxygen Saturation is 100% on RA, normal by my interpretation.    COORDINATION OF CARE:  1:30 AM Pt is not actively participating in care. Discussed treatment plan with pt at  bedside and pt agreed to plan.   Labs Review Labs Reviewed  BASIC METABOLIC PANEL - Abnormal; Notable for the following:    BUN <5 (*)    All other components within normal limits  VALPROIC ACID LEVEL  CBC WITH DIFFERENTIAL/PLATELET  LEVETIRACETAM LEVEL  CBG MONITORING, ED  I-STAT CG4 LACTIC ACID, ED  CBG MONITORING, ED    Imaging Review Dg Chest 2 View  04/19/2015   CLINICAL DATA:  29 year old male with seizure  EXAM: CHEST  2 VIEW  COMPARISON:  Radiograph dated 01/27/2015  FINDINGS: The heart size and mediastinal contours are within normal limits. Both lungs are clear. The visualized skeletal structures are unremarkable.  IMPRESSION: No active cardiopulmonary disease.   Electronically Signed   By: Elgie Collard M.D.   On:  04/19/2015 03:06     EKG Interpretation   Date/Time:  Friday April 19 2015 02:15:35 EDT Ventricular Rate:  78 PR Interval:  125 QRS Duration: 79 QT Interval:  396 QTC Calculation: 451 R Axis:   83 Text Interpretation:  Sinus rhythm Low voltage, extremity leads No  significant change since last tracing Confirmed by Erroll Luna  [id number]) on 04/19/2015 2:33:05 AM      MDM   Final diagnoses:  None    Patient presents to the emergency department for seizures. He states he has about one per month. He states this has not increased. He had 2 seizures today.  His history seems to be changing because triage nurse documents that he has not had seizures for years. I'm unsure what the proper history is as the patient is a poor historian. Emergency department workup was negative for cause of his seizure. He was given his home meds emergency department. He has had no recurrence of his seizure. Patient be discharged in the custody of the police.  Depakote level is normal. Keppra level pending.  I personally performed the services described in this documentation, which was scribed in my presence. The recorded information has been reviewed and is accurate.     Greg Crumble, MD 04/19/15 [phone]

## 2015-04-19 NOTE — ED Notes (Signed)
Spoke to Dr. Mora Bellman, patient requesting his home seizure meds. MD acknowledges.

## 2015-04-19 NOTE — ED Notes (Signed)
Phlebotomy at the bedside  

## 2015-04-19 NOTE — ED Notes (Signed)
Called main lab in regards to keppra.

## 2015-04-19 NOTE — ED Notes (Signed)
Provided graham crackers as requested. Patient leaving in custody of law enforcement.

## 2015-04-22 LAB — LEVETIRACETAM LEVEL: Levetiracetam Lvl: 13.6 ug/mL (ref 10.0–40.0)

## 2018-05-24 ENCOUNTER — Encounter: Payer: Self-pay | Admitting: Internal Medicine

## 2018-05-24 ENCOUNTER — Ambulatory Visit: Payer: Self-pay | Attending: Internal Medicine | Admitting: Internal Medicine

## 2018-05-24 VITALS — BP 137/94 | HR 87 | Temp 98.5°F | Resp 16 | Ht 65.0 in | Wt 209.2 lb

## 2018-05-24 DIAGNOSIS — F17210 Nicotine dependence, cigarettes, uncomplicated: Secondary | ICD-10-CM | POA: Insufficient documentation

## 2018-05-24 DIAGNOSIS — Z79899 Other long term (current) drug therapy: Secondary | ICD-10-CM | POA: Insufficient documentation

## 2018-05-24 DIAGNOSIS — F319 Bipolar disorder, unspecified: Secondary | ICD-10-CM | POA: Insufficient documentation

## 2018-05-24 DIAGNOSIS — R030 Elevated blood-pressure reading, without diagnosis of hypertension: Secondary | ICD-10-CM | POA: Insufficient documentation

## 2018-05-24 DIAGNOSIS — G40409 Other generalized epilepsy and epileptic syndromes, not intractable, without status epilepticus: Secondary | ICD-10-CM | POA: Insufficient documentation

## 2018-05-24 DIAGNOSIS — F1910 Other psychoactive substance abuse, uncomplicated: Secondary | ICD-10-CM | POA: Insufficient documentation

## 2018-05-24 DIAGNOSIS — R7871 Abnormal lead level in blood: Secondary | ICD-10-CM

## 2018-05-24 DIAGNOSIS — Z8659 Personal history of other mental and behavioral disorders: Secondary | ICD-10-CM | POA: Insufficient documentation

## 2018-05-24 DIAGNOSIS — F17200 Nicotine dependence, unspecified, uncomplicated: Secondary | ICD-10-CM | POA: Insufficient documentation

## 2018-05-24 DIAGNOSIS — Z820 Family history of epilepsy and other diseases of the nervous system: Secondary | ICD-10-CM | POA: Insufficient documentation

## 2018-05-24 MED ORDER — LAMOTRIGINE 100 MG PO TABS: 100.0000 mg | ORAL_TABLET | Freq: Every day | ORAL | 6 refills | Status: DC

## 2018-05-24 MED ORDER — LEVETIRACETAM 500 MG PO TABS: 500.0000 mg | ORAL_TABLET | Freq: Three times a day (TID) | ORAL | 6 refills | Status: DC

## 2018-05-24 MED ORDER — MIRTAZAPINE 15 MG PO TABS: 15.0000 mg | ORAL_TABLET | Freq: Every day | ORAL | 6 refills | Status: DC

## 2018-05-24 MED FILL — levETIRAcetam 500 MG TABS: 500 | 30 days supply | Qty: 90 | Fill #0

## 2018-05-24 MED FILL — lamoTRIgine 100 MG TABS: 100 | 30 days supply | Qty: 90 | Fill #0

## 2018-05-24 NOTE — Progress Notes (Signed)
Patient ID: Greg Morales, male    DOB: 1986/01/06  MRN: [id number]  CC: New Patient (Initial Visit)   Subjective: Greg Morales is a 32 y.o. male who presents for new patient visit. His concerns today include:  Patient with history with tobacco dependence, seizure disorder, polysubstance abuse  No primary doctor.  Was incarcerated for 2 yrs and was released in April of last year. Gives hx of grand-mal sz since age 63, and bipolar ds. Not under the care of neurologist.  Followed by [facility], seen Q mth.  Next appt was 1 mth.  He is wanting to try and get his psych meds here as [facility] has now started charging for their visits.  Currently on Zyprexa, Lamictal and Remeron.  SZ:  sz last mth after missing a few doses of Keppra.  He was out of the medication at that time.  Prior to that he had not had a seizure in quite a while.  He is currently unemployed.  He is wondering about getting a letter stating that he cannot work because of seizures.   Tob dep 1/2 pk a day.  Smoked for a number of yr.  Tried to quit in past; was able to quit for 1 mth.  Would like to give a trial of quitting again. -denies street drugs and ETOH use.   Current Outpatient Medications on File Prior to Visit  Medication Sig Dispense Refill  . OLANZapine (ZYPREXA) 10 MG tablet Take 10 mg by mouth at bedtime. Take 4 tablets by mouth at bedtime     No current facility-administered medications on file prior to visit.     No Known Allergies  Social History   Socioeconomic History  . Marital status: Single    Spouse name: Not on file  . Number of children: Not on file  . Years of education: Not on file  . Highest education level: Not on file  Occupational History  . Not on file  Social Needs  . Financial resource strain: Not on file  . Food insecurity:    Worry: Not on file    Inability: Not on file  . Transportation needs:    Medical: Not on file    Non-medical: Not on file  Tobacco Use  . Smoking  status: Current Every Day Smoker    Packs/day: 2.00    Years: 5.00    Pack years: 10.00    Types: Cigarettes  . Smokeless tobacco: Never Used  Substance and Sexual Activity  . Alcohol use: Yes    Comment: occasionally  . Drug use: Yes    Comment: past history  . Sexual activity: Not on file  Lifestyle  . Physical activity:    Days per week: Not on file    Minutes per session: Not on file  . Stress: Not on file  Relationships  . Social connections:    Talks on phone: Not on file    Gets together: Not on file    Attends religious service: Not on file    Active member of club or organization: Not on file    Attends meetings of clubs or organizations: Not on file    Relationship status: Not on file  . Intimate partner violence:    Fear of current or ex partner: Not on file    Emotionally abused: Not on file    Physically abused: Not on file    Forced sexual activity: Not on file  Other Topics Concern  .  Not on file  Social History Narrative  . Not on file    Family History  Problem Relation Age of Onset  . Hypertension Other   . Seizures Other   . Seizures Maternal Grandmother     Past Surgical History:  Procedure Laterality Date  . ESOPHAGOGASTRODUODENOSCOPY      ROS: Review of Systems Negative except as stated above PHYSICAL EXAM: BP (!) 137/94   Pulse 87   Temp 98.5 F (36.9 C) (Oral)   Resp 16   Ht 5\' 5"  (1.651 m)   Wt 209 lb 3.2 oz (94.9 kg)   SpO2 97%   BMI 34.81 kg/m   Physical Exam BP 132/92 General appearance - alert, well appearing, young African-American male and in no distress Mental status - normal mood, behavior, speech, dress, motor activity, and thought processes Neck - supple, no significant adenopathy Chest - clear to auscultation, no wheezes, rales or rhonchi, symmetric air entry Heart - normal rate, regular rhythm, normal S1, S2, no murmurs, rubs, clicks or gallops Neurological - cranial nerves II through XII intact, motor and  sensory grossly normal bilaterally Extremities - peripheral pulses normal, no pedal edema, no clubbing or cyanosis   ASSESSMENT AND PLAN: 1. 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) Refill given on Keppra.  He reports being on Lamictal more so for bipolar disorder.  Encourage continued compliance with his medications. -Advised not to drive until he seizure-free for at least 6 months. -I think he can work.  However advised to avoid any type of work that involves heights or operating heavy equipment/machinery. -Advised to never go swimming alone.  Advised to take showers rather than baths. - levETIRAcetam (KEPPRA) 500 MG tablet; Take 1 tablet (500 mg total) by mouth 3 (three) times daily.  Dispense: 90 tablet; Refill: 6 - CBC - Comprehensive metabolic panel - lamoTRIgine (LAMICTAL) 100 MG tablet; Take 1 tablet (100 mg total) by mouth daily. Take 3 tablets by mouth in the evening with meals  Dispense: 90 tablet; Refill: 6  2. Tobacco dependence Patient advised to quit smoking. Discussed health risks associated with smoking including lung and other types of cancers, chronic lung diseases and CV risks.. Pt ready to give trail of quitting.  Discussed methods to help quit including quitting cold [place], use of NRT, Chantix and Bupropion.  He is wanting to try the nicotine patches.  Information given on 1 800 quit now.  3. History of bipolar disorder Encourage patient to continue seeing the psychiatrist at [facility].  He will need to get refill on Zyprexa from his psychiatrist. - mirtazapine (REMERON) 15 MG tablet; Take 1 tablet (15 mg total) by mouth at bedtime. Take 1/2 tablet by mouth at bedtime  Dispense: 30 tablet; Refill: 6 - lamoTRIgine (LAMICTAL) 100 MG tablet; Take 1 tablet (100 mg total) by mouth daily. Take 3 tablets by mouth in the evening with meals  Dispense: 90 tablet; Refill: 6  4. Elevated blood lead level DASH diet discussed and encouraged.  Follow-up in 1 month with clinical pharmacist for recheck  of blood pressure   Patient was given the opportunity to ask questions.  Patient verbalized understanding of the plan and was able to repeat key elements of the plan.   Orders Placed This Encounter  Procedures  . CBC  . Comprehensive metabolic panel     Requested Prescriptions   Signed Prescriptions Disp Refills  . levETIRAcetam (KEPPRA) 500 MG tablet 90 tablet 6    Sig: Take 1 tablet (500 mg total)  by mouth 3 (three) times daily.  . mirtazapine (REMERON) 15 MG tablet 30 tablet 6    Sig: Take 1 tablet (15 mg total) by mouth at bedtime. Take 1/2 tablet by mouth at bedtime  . lamoTRIgine (LAMICTAL) 100 MG tablet 90 tablet 6    Sig: Take 1 tablet (100 mg total) by mouth daily. Take 3 tablets by mouth in the evening with meals    Return in about 5 months (around 10/24/2018).  Jonah Blueeborah Lusine Corlett, MD, FACP

## 2018-05-24 NOTE — Patient Instructions (Addendum)
Please give visit with clinical pharmacist in 1 month for repeat blood pressure check  Call 1 800 quit now and request the free nicotine patches and gum to help you quit smoking.  I have sent refills on all of your medications to our pharmacy except Zyprexa.  You will need to see your psychiatrist to get a refill on this.  You should not drive until you have been seizure-free for 6 months.  Avoid doing jobs at heights like having to stand on ladders.  Avoid going swimming alone.  I recommend taking showers instead of baths.

## 2018-05-25 ENCOUNTER — Telehealth: Payer: Self-pay

## 2018-05-25 LAB — COMPREHENSIVE METABOLIC PANEL
ALBUMIN: 4.7 g/dL (ref 3.5–5.5)
ALK PHOS: 87 IU/L (ref 39–117)
ALT: 39 IU/L (ref 0–44)
AST: 17 IU/L (ref 0–40)
Albumin/Globulin Ratio: 1.6 (ref 1.2–2.2)
BUN / CREAT RATIO: 6 — AB (ref 9–20)
BUN: 7 mg/dL (ref 6–20)
Bilirubin Total: <0.2 mg/dL (ref 0.0–1.2)
CHLORIDE: 106 mmol/L (ref 96–106)
CO2: 19 mmol/L — AB (ref 20–29)
CREATININE: 1.15 mg/dL (ref 0.76–1.27)
Calcium: 10 mg/dL (ref 8.7–10.2)
GFR calc non Af Amer: 83 mL/min/{1.73_m2} (ref >59)
GFR, EST AFRICAN AMERICAN: 96 mL/min/{1.73_m2} (ref >59)
GLUCOSE: 107 mg/dL — AB (ref 65–99)
Globulin, Total: 3 g/dL (ref 1.5–4.5)
Potassium: 4.2 mmol/L (ref 3.5–5.2)
Sodium: 143 mmol/L (ref 134–144)
TOTAL PROTEIN: 7.7 g/dL (ref 6.0–8.5)

## 2018-05-25 LAB — CBC
HEMOGLOBIN: 16 g/dL (ref 13.0–17.7)
Hematocrit: 46 % (ref 37.5–51.0)
MCH: 31.8 pg (ref 26.6–33.0)
MCHC: 34.8 g/dL (ref 31.5–35.7)
MCV: 92 fL (ref 79–97)
Platelets: 378 10*3/uL (ref 150–450)
RBC: 5.03 x10E6/uL (ref 4.14–5.80)
RDW: 14.6 % (ref 12.3–15.4)
WBC: 7.8 10*3/uL (ref 3.4–10.8)

## 2018-05-25 NOTE — Telephone Encounter (Signed)
Contacted pt to go over lab results pt is aware and doesn't have any questions or concerns 

## 2018-08-26 ENCOUNTER — Emergency Department (HOSPITAL_COMMUNITY)
Admission: EM | Admit: 2018-08-26 | Discharge: 2018-08-27 | Disposition: A | Discharge status: Home / Self Care | Payer: Medicaid - Out of State | Attending: Emergency Medicine | Admitting: Emergency Medicine

## 2018-08-26 ENCOUNTER — Encounter (HOSPITAL_COMMUNITY): Payer: Self-pay

## 2018-08-26 ENCOUNTER — Other Ambulatory Visit: Payer: Self-pay

## 2018-08-26 DIAGNOSIS — F17210 Nicotine dependence, cigarettes, uncomplicated: Secondary | ICD-10-CM | POA: Insufficient documentation

## 2018-08-26 DIAGNOSIS — R569 Unspecified convulsions: Secondary | ICD-10-CM | POA: Insufficient documentation

## 2018-08-26 DIAGNOSIS — R1084 Generalized abdominal pain: Secondary | ICD-10-CM | POA: Insufficient documentation

## 2018-08-26 DIAGNOSIS — R112 Nausea with vomiting, unspecified: Secondary | ICD-10-CM | POA: Insufficient documentation

## 2018-08-26 DIAGNOSIS — Z79899 Other long term (current) drug therapy: Secondary | ICD-10-CM | POA: Insufficient documentation

## 2018-08-26 DIAGNOSIS — R197 Diarrhea, unspecified: Secondary | ICD-10-CM | POA: Insufficient documentation

## 2018-08-26 LAB — CBC
HCT: 48.9 % (ref 39.0–52.0)
HEMOGLOBIN: 16.1 g/dL (ref 13.0–17.0)
MCH: 30.9 pg (ref 26.0–34.0)
MCHC: 32.9 g/dL (ref 30.0–36.0)
MCV: 93.9 fL (ref 80.0–100.0)
Platelets: 422 10*3/uL — ABNORMAL HIGH (ref 150–400)
RBC: 5.21 MIL/uL (ref 4.22–5.81)
RDW: 13.9 % (ref 11.5–15.5)
WBC: 12.7 10*3/uL — ABNORMAL HIGH (ref 4.0–10.5)
nRBC: 0 % (ref 0.0–0.2)

## 2018-08-26 MED ORDER — SODIUM CHLORIDE 0.9 % IV BOLUS
1000.0000 mL | Freq: Once | INTRAVENOUS | Status: AC
  Administered 2018-08-26: 1000 mL via INTRAVENOUS

## 2018-08-26 MED ORDER — LEVETIRACETAM IN NACL 1000 MG/100ML IV SOLN
1000.0000 mg | Freq: Once | INTRAVENOUS | Status: AC
  Administered 2018-08-26: 1000 mg via INTRAVENOUS
  Filled 2018-08-26: qty 100

## 2018-08-26 MED ORDER — ONDANSETRON HCL 4 MG/2ML IJ SOLN
4.0000 mg | Freq: Once | INTRAMUSCULAR | Status: AC
  Administered 2018-08-26: 4 mg via INTRAVENOUS
  Filled 2018-08-26: qty 2

## 2018-08-26 MED ORDER — MORPHINE SULFATE (PF) 4 MG/ML IV SOLN
4.0000 mg | Freq: Once | INTRAVENOUS | Status: AC
  Administered 2018-08-26: 4 mg via INTRAVENOUS
  Filled 2018-08-26: qty 1

## 2018-08-26 NOTE — ED Triage Notes (Signed)
Patient c/o mid upper abdominal pain and N/V this AM.  Patient also reports he had a witnessed seizure around 1500 today. Patient states he is compliant with taking Keppra. Patient denies hitting his  Head or having tongue injury. Patient states he was incontinent of urine. patient states it has been several months since he has had a seizure

## 2018-08-26 NOTE — ED Provider Notes (Signed)
[facility]-EMERGENCY DEPT Provider Note   CSN: [id number] Arrival date & time: 08/26/18  1833     History   Chief Complaint Chief Complaint  Patient presents with  . Abdominal Pain  . Seizures    HPI Greg Morales is a 32 y.o. male.  Patient presents to the emergency department with a chief complaint of seizure.  He has a history of seizures.  He takes Keppra, Lamictal, Zyprexa.  He states that he did not take his medication yesterday, and missed one dose today.  Otherwise, he reports 100% compliance.  He states that since this morning he has also had nausea, vomiting, diarrhea.  He reports crampy abdominal pain.  He reports having had subjective fevers, but has not measured a temperature at home.  Denies any known sick contacts.  He has not taken anything prior to arrival.  The history is provided by the patient. No language interpreter was used.    Past Medical History:  Diagnosis Date  . Chronic abdominal pain   . Medically noncompliant   . Polysubstance abuse (HCC)   . Seizures (HCC)    since childhood    Patient Active Problem List   Diagnosis Date Noted  . 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 Tobacco dependence 05/24/2018  . History of bipolar disorder 05/24/2018  . Elevated blood lead level 05/24/2018    Past Surgical History:  Procedure Laterality Date  . ESOPHAGOGASTRODUODENOSCOPY          Home Medications    Prior to Admission medications   Medication Sig Start Date End Date Taking? Authorizing Provider  lamoTRIgine (LAMICTAL) 100 MG tablet Take 1 tablet (100 mg total) by mouth daily. Take 3 tablets by mouth in the evening with meals 05/24/18   [name], MD  levETIRAcetam (KEPPRA) 500 MG tablet Take 1 tablet (500 mg total) by mouth 3 (three) times daily. 05/24/18   [name], MD  mirtazapine (REMERON) 15 MG tablet Take 1 tablet (15 mg total) by mouth at bedtime. Take 1/2 tablet by mouth at bedtime 05/24/18    [name], MD  OLANZapine (ZYPREXA) 10 MG tablet Take 10 mg by mouth at bedtime. Take 4 tablets by mouth at bedtime    [provider]    Family History Family History  Problem Relation Age of Onset  . Hypertension Other   . Seizures Other   . Seizures Maternal Grandmother     Social History Social History   Tobacco Use  . Smoking status: Current Every Day Smoker    Packs/day: 1.00    Years: 5.00    Pack years: 5.00    Types: Cigarettes  . Smokeless tobacco: Never Used  Substance Use Topics  . Alcohol use: Not Currently    Comment: occasionally  . Drug use: Not Currently    Types: Marijuana     Allergies   Patient has no known allergies.   Review of Systems Review of Systems  All other systems reviewed and are negative.    Physical Exam Updated Vital Signs BP (!) 143/94 (BP Location: Right Arm)   Pulse 77   Temp 98 F (36.7 C) (Oral)   Resp 16   Ht 5\' 6"  (1.676 m)   Wt 92.5 kg   SpO2 100%   BMI 32.93 kg/m   Physical Exam  Constitutional: He is oriented to person, place, and time. He appears well-developed and well-nourished.  HENT:  Head: Normocephalic and atraumatic.  Eyes: Pupils are equal, round, and reactive to light. Conjunctivae and EOM are normal. Right eye exhibits no discharge. Left eye exhibits no discharge. No scleral icterus.  Neck: Normal range of motion. Neck supple. No JVD present.  Cardiovascular: Normal rate, regular rhythm and normal heart sounds. Exam reveals no gallop and no friction rub.  No murmur heard. Pulmonary/Chest: Effort normal and breath sounds normal. No respiratory distress. He has no wheezes. He has no rales. He exhibits no tenderness.  Abdominal: Soft. He exhibits no distension and no mass. There is no tenderness. There is no rebound and no guarding.  Generalized crampy abdominal discomfort, without focal tenderness  Musculoskeletal: Normal range of motion. He exhibits no edema or tenderness.    Neurological: He is alert and oriented to person, place, and time.  Skin: Skin is warm and dry.  Psychiatric: He has a normal mood and affect. His behavior is normal. Judgment and thought content normal.  Nursing note and vitals reviewed.    ED Treatments / Results  Labs (all labs ordered are listed, but only abnormal results are displayed) Labs Reviewed  LIPASE, BLOOD  COMPREHENSIVE METABOLIC PANEL  CBC  URINALYSIS, ROUTINE W REFLEX MICROSCOPIC    EKG None  Radiology No results found.  Procedures Procedures (including critical care time)  Medications Ordered in ED Medications  ondansetron (ZOFRAN) injection 4 mg (has no administration in time range)  morphine 4 MG/ML injection 4 mg (has no administration in time range)  levETIRAcetam (KEPPRA) IVPB 1000 mg/100 mL premix (has no administration in time range)     Initial Impression / Assessment and Plan / ED Course  I have reviewed the triage vital signs and the nursing notes.  Pertinent labs & imaging results that were available during my care of the patient were reviewed by me and considered in my medical decision making (see chart for details).     Patient with 2 seizures today.  He did not injure himself.  Denies any oral trauma.  Has been noncompliant with his medication yesterday and today.  Has had nausea, vomiting, diarrhea today.  No focal abdominal tenderness, but does have diffuse crampy abdominal discomfort.  Vital signs are stable.  Suspect viral process and noncompliance with seizure medications.  We will give fluids, Zofran, treat pain, and will load with Keppra.  Will reassess once labs have returned.  Mild leukocytosis, likely related to viral process probable gastroenteritis.  Abdomen reassessed, no focal tenderness, doubt surgical or acute abdomen.  No recurrence of seizures in the ED.  Patient is at his baseline.  Urged compliance with his seizure medications.  He is tolerating oral intake, and feeling  improved.  Final Clinical Impressions(s) / ED Diagnoses   Final diagnoses:  Seizure (HCC)  Nausea vomiting and diarrhea    ED Discharge Orders         Ordered    ondansetron (ZOFRAN ODT) 4 MG disintegrating tablet  Every 8 hours PRN     08/27/18 0150           Roxy HorsemanBrowning, Larren Copes, PA-C 08/27/18 0153    Tilden Fossaees, Elizabeth, MD 08/27/18 1459

## 2018-08-27 LAB — URINALYSIS, ROUTINE W REFLEX MICROSCOPIC
Bacteria, UA: NONE SEEN
Bilirubin Urine: NEGATIVE
Glucose, UA: NEGATIVE mg/dL
Hgb urine dipstick: NEGATIVE
Ketones, ur: 5 mg/dL — AB
Leukocytes, UA: NEGATIVE
Nitrite: NEGATIVE
Protein, ur: 30 mg/dL — AB
SPECIFIC GRAVITY, URINE: 1.028 (ref 1.005–1.030)
pH: 7 (ref 5.0–8.0)

## 2018-08-27 LAB — LIPASE, BLOOD: Lipase: 25 U/L (ref 11–51)

## 2018-08-27 LAB — COMPREHENSIVE METABOLIC PANEL
ALT: 57 U/L — ABNORMAL HIGH (ref 0–44)
AST: 36 U/L (ref 15–41)
Albumin: 4.6 g/dL (ref 3.5–5.0)
Alkaline Phosphatase: 81 U/L (ref 38–126)
Anion gap: 12 (ref 5–15)
BILIRUBIN TOTAL: 0.6 mg/dL (ref 0.3–1.2)
BUN: 11 mg/dL (ref 6–20)
CO2: 25 mmol/L (ref 22–32)
Calcium: 9.8 mg/dL (ref 8.9–10.3)
Chloride: 105 mmol/L (ref 98–111)
Creatinine, Ser: 1.26 mg/dL — ABNORMAL HIGH (ref 0.61–1.24)
GFR calc Af Amer: >60 mL/min (ref >60)
GFR calc non Af Amer: >60 mL/min (ref >60)
Glucose, Bld: 130 mg/dL — ABNORMAL HIGH (ref 70–99)
Potassium: 3.7 mmol/L (ref 3.5–5.1)
Sodium: 142 mmol/L (ref 135–145)
Total Protein: 8.4 g/dL — ABNORMAL HIGH (ref 6.5–8.1)

## 2018-08-27 MED ORDER — ONDANSETRON 4 MG PO TBDP: 4.0000 mg | ORAL_TABLET | Freq: Three times a day (TID) | ORAL | 0 refills | Status: DC | PRN

## 2018-08-27 MED ORDER — MORPHINE SULFATE (PF) 4 MG/ML IV SOLN
4.0000 mg | Freq: Once | INTRAVENOUS | Status: AC
  Administered 2018-08-27: 4 mg via INTRAVENOUS
  Filled 2018-08-27: qty 1

## 2018-08-27 MED ORDER — ONDANSETRON HCL 4 MG/2ML IJ SOLN
4.0000 mg | Freq: Once | INTRAMUSCULAR | Status: AC
  Administered 2018-08-27: 4 mg via INTRAVENOUS
  Filled 2018-08-27: qty 2

## 2018-10-27 ENCOUNTER — Ambulatory Visit: Payer: Medicaid - Out of State | Admitting: Internal Medicine

## 2018-11-14 ENCOUNTER — Encounter: Payer: Self-pay | Admitting: Internal Medicine

## 2018-11-14 ENCOUNTER — Ambulatory Visit: Payer: Self-pay | Attending: Internal Medicine | Admitting: Internal Medicine

## 2018-11-14 VITALS — BP 112/79 | HR 70 | Temp 98.5°F | Resp 16 | Ht 66.0 in | Wt 205.4 lb

## 2018-11-14 DIAGNOSIS — F17200 Nicotine dependence, unspecified, uncomplicated: Secondary | ICD-10-CM

## 2018-11-14 DIAGNOSIS — G40409 Other generalized epilepsy and epileptic syndromes, not intractable, without status epilepticus: Secondary | ICD-10-CM

## 2018-11-14 DIAGNOSIS — R7989 Other specified abnormal findings of blood chemistry: Secondary | ICD-10-CM

## 2018-11-14 DIAGNOSIS — R945 Abnormal results of liver function studies: Secondary | ICD-10-CM

## 2018-11-14 DIAGNOSIS — Z8659 Personal history of other mental and behavioral disorders: Secondary | ICD-10-CM

## 2018-11-14 MED ORDER — LEVETIRACETAM 500 MG PO TABS: 500.0000 mg | ORAL_TABLET | Freq: Three times a day (TID) | ORAL | 6 refills | Status: DC

## 2018-11-14 NOTE — Progress Notes (Signed)
Patient ID: Greg Morales, male    DOB: 09-18-1986  MRN: [id number]  CC: Seizures   Subjective: Greg Morales is a 33 y.o. male who presents for chronic ds management. His concerns today include:  Patient with history with tobacco dependence, seizure disorder, polysubstance abuse, bipolar disorder  Bipolar disorder:  Last seen at [facility] last mth. Still on Zyprexa, Remeron, and Lamictal.  Reports that he is doing okay on his current medications.  Sz:  Compliant with Keppra.  Sz in 07/2018.  He states that he had skipped a dose of Keppra.  He was seen in the emergency room.  Reports being seizure-free since then.  He continues to drive.  He is requesting a letter again stating that he cannot work.  He states that in the past people have declined hiring him because of his history of seizure disorder.  Tob dep: 1 pk/day. Never called [employer].  States that he is not ready to quit.  Patient Active Problem List   Diagnosis Date Noted  . 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 Tobacco dependence 05/24/2018  . History of bipolar disorder 05/24/2018  . Elevated blood lead level 05/24/2018     Current Outpatient Medications on File Prior to Visit  Medication Sig Dispense Refill  . lamoTRIgine (LAMICTAL) 100 MG tablet Take 1 tablet (100 mg total) by mouth daily. Take 3 tablets by mouth in the evening with meals 90 tablet 6  . levETIRAcetam (KEPPRA) 500 MG tablet Take 1 tablet (500 mg total) by mouth 3 (three) times daily. 90 tablet 6  . mirtazapine (REMERON) 15 MG tablet Take 1 tablet (15 mg total) by mouth at bedtime. Take 1/2 tablet by mouth at bedtime 30 tablet 6  . OLANZapine (ZYPREXA) 10 MG tablet Take 10 mg by mouth at bedtime. Take 4 tablets by mouth at bedtime    . ondansetron (ZOFRAN ODT) 4 MG disintegrating tablet Take 1 tablet (4 mg total) by mouth every 8 (eight) hours as needed for nausea or vomiting. 10 tablet 0   No current facility-administered medications on file prior  to visit.     No Known Allergies  Social History   Socioeconomic History  . Marital status: Single    Spouse name: Not on file  . Number of children: Not on file  . Years of education: Not on file  . Highest education level: Not on file  Occupational History  . Not on file  Social Needs  . Financial resource strain: Not on file  . Food insecurity:    Worry: Not on file    Inability: Not on file  . Transportation needs:    Medical: Not on file    Non-medical: Not on file  Tobacco Use  . Smoking status: Current Every Day Smoker    Packs/day: 1.00    Years: 5.00    Pack years: 5.00    Types: Cigarettes  . Smokeless tobacco: Never Used  Substance and Sexual Activity  . Alcohol use: Not Currently    Comment: occasionally  . Drug use: Not Currently    Types: Marijuana  . Sexual activity: Not on file  Lifestyle  . Physical activity:    Days per week: Not on file    Minutes per session: Not on file  . Stress: Not on file  Relationships  . Social connections:    Talks on phone: Not on file    Gets together: Not on file    Attends religious  service: Not on file    Active member of club or organization: Not on file    Attends meetings of clubs or organizations: Not on file    Relationship status: Not on file  . Intimate partner violence:    Fear of current or ex partner: Not on file    Emotionally abused: Not on file    Physically abused: Not on file    Forced sexual activity: Not on file  Other Topics Concern  . Not on file  Social History Narrative  . Not on file    Family History  Problem Relation Age of Onset  . Hypertension Other   . Seizures Other   . Seizures Maternal Grandmother     Past Surgical History:  Procedure Laterality Date  . ESOPHAGOGASTRODUODENOSCOPY      ROS: Review of Systems Negative except as stated above  PHYSICAL EXAM: BP 112/79   Pulse 70   Temp 98.5 F (36.9 C) (Oral)   Resp 16   Ht 5\' 6"  (1.676 m)   Wt 205 lb 6.4 oz  (93.2 kg)   SpO2 98%   BMI 33.15 kg/m   Physical Exam  General appearance - alert, well appearing, and in no distress Mental status - normal mood, behavior, speech, dress, motor activity, and thought processes Chest - clear to auscultation, no wheezes, rales or rhonchi, symmetric air entry Heart - normal rate, regular rhythm, normal S1, S2, no murmurs, rubs, clicks or gallops Abdomen - soft, nontender, nondistended, no masses or organomegaly Extremities - peripheral pulses normal, no pedal edema, no clubbing or cyanosis  CMP Latest Ref Rng & Units 08/26/2018 05/24/2018 04/19/2015  Glucose 70 - 99 mg/dL 119(J130(H) 478(G107(H) 80  BUN 6 - 20 mg/dL 11 7 <9(F<5(L)  Creatinine 0.61 - 1.24 mg/dL 6.21(H1.26(H) 0.861.15 5.781.14  Sodium 135 - 145 mmol/L 142 143 142  Potassium 3.5 - 5.1 mmol/L 3.7 4.2 3.6  Chloride 98 - 111 mmol/L 105 106 104  CO2 22 - 32 mmol/L 25 19(L) 29  Calcium 8.9 - 10.3 mg/dL 9.8 46.910.0 8.9  Total Protein 6.5 - 8.1 g/dL 6.2(X8.4(H) 7.7 -  Total Bilirubin 0.3 - 1.2 mg/dL 0.6 <5.2<0.2 -  Alkaline Phos 38 - 126 U/L 81 87 -  AST 15 - 41 U/L 36 17 -  ALT 0 - 44 U/L 57(H) 39 -   CBC    Component Value Date/Time   WBC 12.7 (H) 08/26/2018 2331   RBC 5.21 08/26/2018 2331   HGB 16.1 08/26/2018 2331   HGB 16.0 05/24/2018 1538   HCT 48.9 08/26/2018 2331   HCT 46.0 05/24/2018 1538   PLT 422 (H) 08/26/2018 2331   PLT 378 05/24/2018 1538   MCV 93.9 08/26/2018 2331   MCV 92 05/24/2018 1538   MCH 30.9 08/26/2018 2331   MCHC 32.9 08/26/2018 2331   RDW 13.9 08/26/2018 2331   RDW 14.6 05/24/2018 1538   LYMPHSABS 2.7 04/19/2015 0136   MONOABS 0.4 04/19/2015 0136   EOSABS 0.1 04/19/2015 0136   BASOSABS 0.0 04/19/2015 0136    ASSESSMENT AND PLAN: 1. Grand mal seizure  [facility](HCC) Encourage continued compliance with his medication Keppra. -Advised not to drive until he seizure-free for at least 6 months. -Again I told patient that he can work.  However advised to avoid any type of work that involves heights or  operating heavy equipment/machinery. - levETIRAcetam (KEPPRA) 500 MG tablet; Take 1 tablet (500 mg total) by mouth 3 (three) times daily.  Dispense: 90 tablet;  Refill: 6  2. Tobacco dependence Patient advised to quit smoking. Discussed health risks associated with smoking including lung and other types of cancers, chronic lung diseases and CV risks.. Pt not ready to give trail of quitting.  About 3 minutes spent on counseling. 3. History of bipolar disorder Continue to follow-up at [facility]  He still has refills on his psych medications.  4. Abnormal LFTs - Comprehensive metabolic panel; Future    Patient was given the opportunity to ask questions.  Patient verbalized understanding of the plan and was able to repeat key elements of the plan.   No orders of the defined types were placed in this encounter.    Requested Prescriptions    No prescriptions requested or ordered in this encounter    No follow-ups on file.  Jonah Blue, MD, FACP

## 2018-11-14 NOTE — Patient Instructions (Signed)
You should not drive until you have been seizure-free for at least 6 months.  Avoid doing work that involve heights like standing on ladders.  You should also avoid work that involve using heavy machinery or working on a [employer].

## 2019-05-17 ENCOUNTER — Emergency Department (HOSPITAL_COMMUNITY)
Admission: EM | Admit: 2019-05-17 | Discharge: 2019-05-18 | Disposition: A | Discharge status: Home / Self Care | Payer: Self-pay | Attending: Emergency Medicine | Admitting: Emergency Medicine

## 2019-05-17 ENCOUNTER — Encounter (HOSPITAL_COMMUNITY): Payer: Self-pay | Admitting: Family Medicine

## 2019-05-17 ENCOUNTER — Emergency Department (HOSPITAL_COMMUNITY): Payer: Self-pay

## 2019-05-17 DIAGNOSIS — R1084 Generalized abdominal pain: Secondary | ICD-10-CM | POA: Insufficient documentation

## 2019-05-17 DIAGNOSIS — Z79899 Other long term (current) drug therapy: Secondary | ICD-10-CM | POA: Insufficient documentation

## 2019-05-17 DIAGNOSIS — F17210 Nicotine dependence, cigarettes, uncomplicated: Secondary | ICD-10-CM | POA: Insufficient documentation

## 2019-05-17 DIAGNOSIS — R569 Unspecified convulsions: Secondary | ICD-10-CM | POA: Insufficient documentation

## 2019-05-17 LAB — CBC WITH DIFFERENTIAL/PLATELET
Abs Immature Granulocytes: 0.04 10*3/uL (ref 0.00–0.07)
Basophils Absolute: 0.1 10*3/uL (ref 0.0–0.1)
Basophils Relative: 1 %
Eosinophils Absolute: 0.1 10*3/uL (ref 0.0–0.5)
Eosinophils Relative: 1 %
HCT: 46 % (ref 39.0–52.0)
Hemoglobin: 15 g/dL (ref 13.0–17.0)
Immature Granulocytes: 0 %
Lymphocytes Relative: 12 %
Lymphs Abs: 1.5 10*3/uL (ref 0.7–4.0)
MCH: 31.5 pg (ref 26.0–34.0)
MCHC: 32.6 g/dL (ref 30.0–36.0)
MCV: 96.6 fL (ref 80.0–100.0)
Monocytes Absolute: 0.5 10*3/uL (ref 0.1–1.0)
Monocytes Relative: 4 %
Neutro Abs: 10.4 10*3/uL — ABNORMAL HIGH (ref 1.7–7.7)
Neutrophils Relative %: 82 %
Platelets: 360 10*3/uL (ref 150–400)
RBC: 4.76 MIL/uL (ref 4.22–5.81)
RDW: 14.7 % (ref 11.5–15.5)
WBC: 12.7 10*3/uL — ABNORMAL HIGH (ref 4.0–10.5)
nRBC: 0 % (ref 0.0–0.2)

## 2019-05-17 LAB — URINALYSIS, ROUTINE W REFLEX MICROSCOPIC
Bilirubin Urine: NEGATIVE
Glucose, UA: NEGATIVE mg/dL
Hgb urine dipstick: NEGATIVE
Ketones, ur: NEGATIVE mg/dL
Leukocytes,Ua: NEGATIVE
Nitrite: NEGATIVE
Protein, ur: NEGATIVE mg/dL
Specific Gravity, Urine: 1.021 (ref 1.005–1.030)
pH: 6 (ref 5.0–8.0)

## 2019-05-17 LAB — COMPREHENSIVE METABOLIC PANEL
ALT: 24 U/L (ref 0–44)
AST: 20 U/L (ref 15–41)
Albumin: 4.2 g/dL (ref 3.5–5.0)
Alkaline Phosphatase: 67 U/L (ref 38–126)
Anion gap: 12 (ref 5–15)
BUN: 6 mg/dL (ref 6–20)
CO2: 20 mmol/L — ABNORMAL LOW (ref 22–32)
Calcium: 9.4 mg/dL (ref 8.9–10.3)
Chloride: 110 mmol/L (ref 98–111)
Creatinine, Ser: 1.27 mg/dL — ABNORMAL HIGH (ref 0.61–1.24)
GFR calc Af Amer: >60 mL/min (ref >60)
GFR calc non Af Amer: >60 mL/min (ref >60)
Glucose, Bld: 127 mg/dL — ABNORMAL HIGH (ref 70–99)
Potassium: 3.7 mmol/L (ref 3.5–5.1)
Sodium: 142 mmol/L (ref 135–145)
Total Bilirubin: 0.6 mg/dL (ref 0.3–1.2)
Total Protein: 8 g/dL (ref 6.5–8.1)

## 2019-05-17 LAB — LIPASE, BLOOD: Lipase: 23 U/L (ref 11–51)

## 2019-05-17 MED ORDER — MORPHINE SULFATE (PF) 4 MG/ML IV SOLN
4.0000 mg | Freq: Once | INTRAVENOUS | Status: AC
  Administered 2019-05-17: 21:00:00 4 mg via INTRAVENOUS
  Filled 2019-05-17: qty 1

## 2019-05-17 MED ORDER — ONDANSETRON HCL 4 MG/2ML IJ SOLN
4.0000 mg | Freq: Once | INTRAMUSCULAR | Status: AC
  Administered 2019-05-17: 21:00:00 4 mg via INTRAVENOUS
  Filled 2019-05-17: qty 2

## 2019-05-17 MED ORDER — IOHEXOL 300 MG/ML  SOLN
100.0000 mL | Freq: Once | INTRAMUSCULAR | Status: AC | PRN
  Administered 2019-05-17: 100 mL via INTRAVENOUS

## 2019-05-17 MED ORDER — SODIUM CHLORIDE 0.9 % IV BOLUS
1000.0000 mL | Freq: Once | INTRAVENOUS | Status: AC
  Administered 2019-05-17: 21:00:00 1000 mL via INTRAVENOUS

## 2019-05-17 MED ORDER — DICYCLOMINE HCL 10 MG/ML IM SOLN
20.0000 mg | Freq: Once | INTRAMUSCULAR | Status: AC
  Administered 2019-05-17: 18:00:00 20 mg via INTRAMUSCULAR
  Filled 2019-05-17: qty 2

## 2019-05-17 MED ORDER — SODIUM CHLORIDE (PF) 0.9 % IJ SOLN
INTRAMUSCULAR | Status: AC
  Administered 2019-05-17: 21:00:00
  Filled 2019-05-17: qty 50

## 2019-05-17 MED ORDER — LORAZEPAM 2 MG/ML IJ SOLN
0.5000 mg | Freq: Once | INTRAMUSCULAR | Status: AC
  Administered 2019-05-17: 21:00:00 0.5 mg via INTRAVENOUS
  Filled 2019-05-17: qty 1

## 2019-05-17 MED ORDER — DICYCLOMINE HCL 10 MG PO CAPS
10.0000 mg | ORAL_CAPSULE | Freq: Once | ORAL | Status: AC
  Administered 2019-05-17: 10 mg via ORAL
  Filled 2019-05-17: qty 1

## 2019-05-17 MED ORDER — LORAZEPAM 2 MG/ML IJ SOLN
INTRAMUSCULAR | Status: AC
  Filled 2019-05-17: qty 1

## 2019-05-17 NOTE — ED Notes (Signed)
Urinal bedside  

## 2019-05-17 NOTE — ED Provider Notes (Signed)
[facility] DEPT Provider Note   CSN: [id number] Arrival date & time: 05/17/19  1716     History   Chief Complaint Chief Complaint  Patient presents with  . Seizures    HPI Greg Morales is a 33 y.o. male.     HPI Patient presents by EMS for seizures.  He is unable to give details regarding the seizures.  No tongue biting or incontinence. States he has been compliant with his Keppra.  He is complaining now of abdominal pain.  No nausea or vomiting.  No headache or neck pain.  No focal weakness or numbness. Past Medical History:  Diagnosis Date  . Chronic abdominal pain   . Medically noncompliant   . Polysubstance abuse ([place])   . Seizures ([place])    since childhood    Patient Active Problem List   Diagnosis Date Noted  . Grand mal seizure ([place]) 05/24/2018  . Tobacco dependence 05/24/2018  . History of bipolar disorder 05/24/2018  . Elevated blood lead level 05/24/2018    Past Surgical History:  Procedure Laterality Date  . ESOPHAGOGASTRODUODENOSCOPY          Home Medications    Prior to Admission medications   Medication Sig Start Date End Date Taking? Authorizing Provider  lamoTRIgine (LAMICTAL) 100 MG tablet Take 1 tablet (100 mg total) by mouth daily. Take 3 tablets by mouth in the evening with meals 05/24/18  Yes Ladell Pier, MD  levETIRAcetam (KEPPRA) 500 MG tablet Take 1 tablet (500 mg total) by mouth 3 (three) times daily. 11/14/18  Yes Ladell Pier, MD  mirtazapine (REMERON) 15 MG tablet Take 1 tablet (15 mg total) by mouth at bedtime. Take 1/2 tablet by mouth at bedtime 05/24/18  Yes Ladell Pier, MD  OLANZapine (ZYPREXA) 10 MG tablet Take 10 mg by mouth at bedtime. Take 4 tablets by mouth at bedtime   Yes [provider]  ondansetron (ZOFRAN ODT) 4 MG disintegrating tablet Take 1 tablet (4 mg total) by mouth every 8 (eight) hours as needed for nausea or vomiting. Patient not taking: Reported on  05/17/2019 08/27/18   Montine Circle, PA-C    Family History Family History  Problem Relation Age of Onset  . Hypertension Other   . Seizures Other   . Seizures Maternal Grandmother     Social History Social History   Tobacco Use  . Smoking status: Current Every Day Smoker    Packs/day: 1.00    Years: 5.00    Pack years: 5.00    Types: Cigarettes  . Smokeless tobacco: Never Used  Substance Use Topics  . Alcohol use: Not Currently  . Drug use: Not Currently    Types: Marijuana     Allergies   Patient has no known allergies.   Review of Systems Review of Systems  Constitutional: Negative for chills and fever.  HENT: Negative for sinus pressure and trouble swallowing.   Eyes: Negative for visual disturbance.  Respiratory: Negative for shortness of breath.   Cardiovascular: Negative for chest pain.  Gastrointestinal: Positive for abdominal pain. Negative for constipation, diarrhea, nausea and vomiting.  Genitourinary: Negative for dysuria, flank pain and frequency.  Musculoskeletal: Negative for back pain, myalgias and neck pain.  Skin: Negative for rash and wound.  Neurological: Positive for seizures. Negative for dizziness, weakness, light-headedness, numbness and headaches.  All other systems reviewed and are negative.    Physical Exam Updated Vital Signs BP (!) 171/102   Pulse 93  Temp 98.3 F (36.8 C) (Oral)   Resp 20   Ht 5\' 6"  (1.676 m)   Wt 93 kg   SpO2 99%   BMI 33.09 kg/m   Physical Exam Vitals signs and nursing note reviewed.  Constitutional:      General: He is not in acute distress.    Appearance: Normal appearance. He is well-developed. He is not ill-appearing.  HENT:     Head: Normocephalic and atraumatic.     Nose: Nose normal.     Mouth/Throat:     Mouth: Mucous membranes are moist.  Eyes:     Extraocular Movements: Extraocular movements intact.     Pupils: Pupils are equal, round, and reactive to light.  Neck:      Musculoskeletal: Normal range of motion and neck supple. No neck rigidity or muscular tenderness.  Cardiovascular:     Rate and Rhythm: Normal rate and regular rhythm.     Heart sounds: No murmur. No friction rub. No gallop.   Pulmonary:     Effort: Pulmonary effort is normal. No respiratory distress.     Breath sounds: Normal breath sounds. No stridor. No wheezing, rhonchi or rales.  Chest:     Chest wall: No tenderness.  Abdominal:     General: Bowel sounds are normal. There is no distension.     Palpations: Abdomen is soft. There is no mass.     Tenderness: There is abdominal tenderness. There is no right CVA tenderness, left CVA tenderness, guarding or rebound.     Hernia: No hernia is present.     Comments: Mild diffuse abdominal tenderness to palpation.  No rebound or guarding.  Musculoskeletal: Normal range of motion.        General: No swelling, tenderness, deformity or signs of injury.     Right lower leg: No edema.     Left lower leg: No edema.  Lymphadenopathy:     Cervical: No cervical adenopathy.  Skin:    General: Skin is warm and dry.     Findings: No erythema or rash.  Neurological:     General: No focal deficit present.     Mental Status: He is alert and oriented to person, place, and time.     Comments: 5/5 motor in all extremities.  Sensation fully intact.  Psychiatric:        Behavior: Behavior normal.      ED Treatments / Results  Labs (all labs ordered are listed, but only abnormal results are displayed) Labs Reviewed  CBC WITH DIFFERENTIAL/PLATELET - Abnormal; Notable for the following components:      Result Value   WBC 12.7 (*)    Neutro Abs 10.4 (*)    All other components within normal limits  COMPREHENSIVE METABOLIC PANEL - Abnormal; Notable for the following components:   CO2 20 (*)    Glucose, Bld 127 (*)    Creatinine, Ser 1.27 (*)    All other components within normal limits  LIPASE, BLOOD  URINALYSIS, ROUTINE W REFLEX MICROSCOPIC     EKG EKG Interpretation  Date/Time:  Wednesday May 17 2019 17:53:33 EDT Ventricular Rate:  91 PR Interval:    QRS Duration: 82 QT Interval:  348 QTC Calculation: 429 R Axis:   50 Text Interpretation:  Sinus rhythm Low voltage, extremity leads Baseline wander in lead(s) V5 Confirmed by Loren RacerYelverton, Remedy Corporan ([id number]) on 05/17/2019 6:13:20 PM Also confirmed by Loren RacerYelverton, Prathik Aman ([id number]), editor Barbette Hairassel, [place] [phone]  on 05/18/2019 7:04:57 AM   Radiology  No results found.  Procedures Procedures (including critical care time)  Medications Ordered in ED Medications  dicyclomine (BENTYL) injection 20 mg (20 mg Intramuscular Given 05/17/19 1805)  morphine 4 MG/ML injection 4 mg (4 mg Intravenous Given 05/17/19 2050)  LORazepam (ATIVAN) injection 0.5 mg (0.5 mg Intravenous Given 05/17/19 2050)  ondansetron (ZOFRAN) injection 4 mg (4 mg Intravenous Given 05/17/19 2050)  sodium chloride 0.9 % bolus 1,000 mL (0 mLs Intravenous Stopped 05/18/19 0001)  sodium chloride (PF) 0.9 % injection (  Given by Other 05/17/19 2126)  iohexol (OMNIPAQUE) 300 MG/ML solution 100 mL (100 mLs Intravenous Contrast Given 05/17/19 2132)  dicyclomine (BENTYL) capsule 10 mg (10 mg Oral Given 05/17/19 2337)     Initial Impression / Assessment and Plan / ED Course  I have reviewed the triage vital signs and the nursing notes.  Pertinent labs & imaging results that were available during my care of the patient were reviewed by me and considered in my medical decision making (see chart for details).        No further seizure-like activity. Patient complaining of persistent abdominal pain.  Diaphoretic on reexam evaluation with no improvement after Bentyl.  Will get CT abdomen pelvis and signed out to oncoming emergency provider.   Final Clinical Impressions(s) / ED Diagnoses   Final diagnoses:  Seizure-like activity (HCC)  Generalized abdominal pain    ED Discharge Orders    None       Loren RacerYelverton, Aavya Shafer,  MD 05/18/19 1438

## 2019-05-17 NOTE — ED Provider Notes (Signed)
  Physical Exam  BP (!) 171/102   Pulse 93   Temp 98.3 F (36.8 C) (Oral)   Resp 20   Ht 5\' 6"  (1.676 m)   Wt 93 kg   SpO2 99%   BMI 33.09 kg/m   Physical Exam  ED Course/Procedures     Procedures  MDM  Patient care received from Dr. Lita Mains at shift change, please see his note for full HPI.  Briefly, patient with chronic abdominal pain here for 5 witnessed seizures in the past 25 minutes.  He is currently on Keppra, was not given a loading dose while in the ED as he had no seizure episodes in 6 hours while in the ED.  Disposition pending on CT abdomen and pelvis.  11:15 PM patient was updated on his CT results, he reports still some abdominal pain but is asking for ginger ale, will provide this for patient along with an additional dose of Bentyl.  Patient's with otherwise stable vital signs advised to follow-up with PCP due to his chronic abdominal pain.  Return precautions discussed at length.   Portions of this note were generated with Lobbyist. Dictation errors may occur despite best attempts at proofreading.     Janeece Fitting, PA-C 05/17/19 2319    Valarie Merino, MD 05/29/19 [phone]

## 2019-05-17 NOTE — Discharge Instructions (Addendum)
Please follow-up with your primary care physician as needed. °

## 2019-05-17 NOTE — ED Triage Notes (Signed)
Patient is from home and transported via [facility]. EMS reports patient has had 5 witnessed seizures in 25 minutes. Patient reported to EMS he had a headache and was tired. IV was initiated and Zofran 4mg  administered.

## 2019-05-22 ENCOUNTER — Ambulatory Visit: Payer: Self-pay | Attending: Internal Medicine | Admitting: Internal Medicine

## 2019-05-22 ENCOUNTER — Encounter: Payer: Self-pay | Admitting: Internal Medicine

## 2019-05-22 ENCOUNTER — Ambulatory Visit: Payer: Self-pay

## 2019-05-22 DIAGNOSIS — Z2821 Immunization not carried out because of patient refusal: Secondary | ICD-10-CM

## 2019-05-22 DIAGNOSIS — R7989 Other specified abnormal findings of blood chemistry: Secondary | ICD-10-CM

## 2019-05-22 DIAGNOSIS — R911 Solitary pulmonary nodule: Secondary | ICD-10-CM

## 2019-05-22 DIAGNOSIS — F17200 Nicotine dependence, unspecified, uncomplicated: Secondary | ICD-10-CM

## 2019-05-22 DIAGNOSIS — IMO0001 Reserved for inherently not codable concepts without codable children: Secondary | ICD-10-CM

## 2019-05-22 DIAGNOSIS — R945 Abnormal results of liver function studies: Secondary | ICD-10-CM

## 2019-05-22 DIAGNOSIS — G40409 Other generalized epilepsy and epileptic syndromes, not intractable, without status epilepticus: Secondary | ICD-10-CM

## 2019-05-22 NOTE — Progress Notes (Signed)
Virtual Visit via Telephone Note Due to current restrictions/limitations of in-office visits due to the COVID-19 pandemic, this scheduled clinical appointment was converted to a telehealth visit  I connected with Greg Morales on 05/22/19 at 2:18 p.m by telephone and verified that I am speaking with the correct person using two identifiers. I am in my office.  The patient is at home.  Only the patient and myself participated in this encounter.  I discussed the limitations, risks, security and privacy concerns of performing an evaluation and management service by telephone and the availability of in person appointments. I also discussed with the patient that there may be a patient responsible charge related to this service. The patient expressed understanding and agreed to proceed.   History of Present Illness: Patient with history with tob dep, SZ ds, polysubstance abuse, bipolar disorder.  This was ER f/u.   Seizure disorder: Patient was taken to ER via EMS on 05/17/2019 after a witnessed seizure.  He apparently had several seizures there in the emergency room also.  Patient is on [place] for seizures and states that he has been taking it 3 times a day as prescribed.  Keppra level was not checked in the emergency room.  It is unclear to me whether patient was loaded with Keppra in the ER.  He did receive Ativan.  Of note patient is also on Lamictal through his psychiatrist at [facility].  He reports no recent decreases in Lamictal and states that he has been taking that consistently also -He has not had any seizure since ER visit -CT of the abdomen was also done as patient had complained of some abdominal pain after having the seizure.  Incidental finding was a subpleural nodule in the left lung base.  LT lung nodule: 1.4 cm new subpleural nodule noted in the left lung base incidentally on CT scan of the abdomen.  Radiologist recommended a repeat CAT scan in 3 months to ensure stability.  Patient denies  any chronic cough.  He continues to smoke about half a pack of cigarettes a day.   Current Outpatient Medications on File Prior to Visit  Medication Sig Dispense Refill  . lamoTRIgine (LAMICTAL) 100 MG tablet Take 1 tablet (100 mg total) by mouth daily. Take 3 tablets by mouth in the evening with meals 90 tablet 6  . levETIRAcetam (KEPPRA) 500 MG tablet Take 1 tablet (500 mg total) by mouth 3 (three) times daily. 90 tablet 6  . mirtazapine (REMERON) 15 MG tablet Take 1 tablet (15 mg total) by mouth at bedtime. Take 1/2 tablet by mouth at bedtime 30 tablet 6  . OLANZapine (ZYPREXA) 10 MG tablet Take 10 mg by mouth at bedtime. Take 4 tablets by mouth at bedtime    . ondansetron (ZOFRAN ODT) 4 MG disintegrating tablet Take 1 tablet (4 mg total) by mouth every 8 (eight) hours as needed for nausea or vomiting. (Patient not taking: Reported on 05/17/2019) 10 tablet 0   No current facility-administered medications on file prior to visit.     Observations/Objective: No direct observation done as this was a telephone encounter  Assessment and Plan: 1. Grand mal seizure [facility]) Patient will come to the lab today to have [place] level checked.  In the meantime he will continue Keppra 500 mg 3 times a day. -Also advised that he pick up the forms from the front desk for the orange card/cone discount and fill them out as soon as possible so that we can refer him  to neurology once he is approved. Advised that he should not drive until he has been seizure-free for at least 6 months - Levetiracetam level - Basic Metabolic Panel  2. Tobacco dependence Advised to quit.  Not ready to commit to doing so.  Less than 5 minutes spent on counseling  3. Lung nodule < 6cm on CT Advised patient that given his history of cigarette smoking he is at risk for lung cancer.  We do need to do a repeat scan in 3 months to see whether there is any changes in this nodule  4. Influenza vaccination declined Offered to give him  the flu vaccine when he comes to have his blood test done.  However patient declined   Follow Up Instructions: 7 wks  I discussed the assessment and treatment plan with the patient. The patient was provided an opportunity to ask questions and all were answered. The patient agreed with the plan and demonstrated an understanding of the instructions.   The patient was advised to call back or seek an in-person evaluation if the symptoms worsen or if the condition fails to improve as anticipated.  I provided 12 minutes of non-face-to-face time during this encounter.   Jonah Blueeborah Samella Lucchetti, MD

## 2019-05-22 NOTE — Progress Notes (Signed)
Pt states he had only 1 seizure since being seen at the ED

## 2019-05-23 LAB — COMPREHENSIVE METABOLIC PANEL
ALT: 37 IU/L (ref 0–44)
AST: 20 IU/L (ref 0–40)
Albumin/Globulin Ratio: 1.6 (ref 1.2–2.2)
Albumin: 4.2 g/dL (ref 4.0–5.0)
Alkaline Phosphatase: 72 IU/L (ref 39–117)
BUN/Creatinine Ratio: 6 — ABNORMAL LOW (ref 9–20)
BUN: 7 mg/dL (ref 6–20)
Bilirubin Total: 0.2 mg/dL (ref 0.0–1.2)
CO2: 21 mmol/L (ref 20–29)
Calcium: 9.2 mg/dL (ref 8.7–10.2)
Chloride: 106 mmol/L (ref 96–106)
Creatinine, Ser: 1.09 mg/dL (ref 0.76–1.27)
GFR calc Af Amer: 102 mL/min/{1.73_m2} (ref >59)
GFR calc non Af Amer: 88 mL/min/{1.73_m2} (ref >59)
Globulin, Total: 2.7 g/dL (ref 1.5–4.5)
Glucose: 93 mg/dL (ref 65–99)
Potassium: 3.8 mmol/L (ref 3.5–5.2)
Sodium: 142 mmol/L (ref 134–144)
Total Protein: 6.9 g/dL (ref 6.0–8.5)

## 2019-05-26 ENCOUNTER — Telehealth: Payer: Self-pay | Admitting: Internal Medicine

## 2019-05-26 DIAGNOSIS — G40409 Other generalized epilepsy and epileptic syndromes, not intractable, without status epilepticus: Secondary | ICD-10-CM

## 2019-06-02 ENCOUNTER — Ambulatory Visit: Payer: Self-pay

## 2019-06-12 ENCOUNTER — Other Ambulatory Visit: Payer: Self-pay

## 2019-06-12 ENCOUNTER — Emergency Department (HOSPITAL_COMMUNITY)
Admission: EM | Admit: 2019-06-12 | Discharge: 2019-06-13 | Disposition: A | Discharge status: Home / Self Care | Payer: Self-pay | Attending: Emergency Medicine | Admitting: Emergency Medicine

## 2019-06-12 ENCOUNTER — Encounter (HOSPITAL_COMMUNITY): Payer: Self-pay | Admitting: Emergency Medicine

## 2019-06-12 DIAGNOSIS — R112 Nausea with vomiting, unspecified: Secondary | ICD-10-CM | POA: Insufficient documentation

## 2019-06-12 DIAGNOSIS — F17210 Nicotine dependence, cigarettes, uncomplicated: Secondary | ICD-10-CM | POA: Insufficient documentation

## 2019-06-12 DIAGNOSIS — R569 Unspecified convulsions: Secondary | ICD-10-CM | POA: Insufficient documentation

## 2019-06-12 DIAGNOSIS — Z79899 Other long term (current) drug therapy: Secondary | ICD-10-CM | POA: Insufficient documentation

## 2019-06-12 DIAGNOSIS — R1084 Generalized abdominal pain: Secondary | ICD-10-CM | POA: Insufficient documentation

## 2019-06-12 LAB — CBC
HCT: 45.6 % (ref 39.0–52.0)
Hemoglobin: 15.2 g/dL (ref 13.0–17.0)
MCH: 31.6 pg (ref 26.0–34.0)
MCHC: 33.3 g/dL (ref 30.0–36.0)
MCV: 94.8 fL (ref 80.0–100.0)
Platelets: 439 10*3/uL — ABNORMAL HIGH (ref 150–400)
RBC: 4.81 MIL/uL (ref 4.22–5.81)
RDW: 13.9 % (ref 11.5–15.5)
WBC: 13 10*3/uL — ABNORMAL HIGH (ref 4.0–10.5)
nRBC: 0 % (ref 0.0–0.2)

## 2019-06-12 MED ORDER — HALOPERIDOL LACTATE 5 MG/ML IJ SOLN: 2.0000 mg | Freq: Once | INTRAMUSCULAR | Status: DC

## 2019-06-12 MED ORDER — SODIUM CHLORIDE 0.9 % IV BOLUS
1000.0000 mL | Freq: Once | INTRAVENOUS | Status: AC
  Administered 2019-06-12: 1000 mL via INTRAVENOUS

## 2019-06-12 MED ORDER — LORAZEPAM 2 MG/ML IJ SOLN
1.0000 mg | Freq: Once | INTRAMUSCULAR | Status: AC
  Administered 2019-06-12: 1 mg via INTRAVENOUS
  Filled 2019-06-12: qty 1

## 2019-06-12 NOTE — ED Notes (Signed)
IV team at bedside 

## 2019-06-12 NOTE — Telephone Encounter (Signed)
The message was addressed I called pt the same day and made him aware. Pt stated he was going to come back but never did

## 2019-06-12 NOTE — ED Triage Notes (Signed)
Pt to ED via [facility] after reported having a seizure.  Pt has hx of seizures and takes Keppra for same.  Pt st's he has not missed any of his meds.  Pt also c/o nausea.  EMS gave pt Zofran 4mg  IM PTA

## 2019-06-12 NOTE — ED Provider Notes (Signed)
[facility] Emergency Department Provider Note MRN:  [id number]  Arrival date & time: 06/12/19     Chief Complaint   Seizures   History of Present Illness   Greg Morales is a 33 y.o. year-old male with a history of chronic abdominal pain, seizures presenting to the ED with chief complaint of seizure.  Endorsing nausea and vomiting since this morning.  Thinks he might of vomited his seizure medication.  Seizure witnessed by significant other today, full tonic-clonic behavior.  No trauma.  Continued nausea, vomiting, generalized abdominal pain.  Has had long history of abdominal issues.  Denies fever, no chest pain or shortness of breath.  Review of Systems  A complete 10 system review of systems was obtained and all systems are negative except as noted in the HPI and PMH.   Patient's Health History    Past Medical History:  Diagnosis Date  . Chronic abdominal pain   . Medically noncompliant   . Polysubstance abuse (HCC)   . Seizures (HCC)    since childhood    Past Surgical History:  Procedure Laterality Date  . ESOPHAGOGASTRODUODENOSCOPY      Family History  Problem Relation Age of Onset  . Hypertension Other   . Seizures Other   . Seizures Maternal Grandmother     Social History   Socioeconomic History  . Marital status: Single    Spouse name: Not on file  . Number of children: Not on file  . Years of education: Not on file  . Highest education level: Not on file  Occupational History  . Not on file  Social Needs  . Financial resource strain: Not on file  . Food insecurity    Worry: Not on file    Inability: Not on file  . Transportation needs    Medical: Not on file    Non-medical: Not on file  Tobacco Use  . Smoking status: Current Every Day Smoker    Packs/day: 1.00    Years: 5.00    Pack years: 5.00    Types: Cigarettes  . Smokeless tobacco: Never Used  Substance and Sexual Activity  . Alcohol use: Not Currently  . Drug  use: Not Currently    Types: Marijuana  . Sexual activity: Not on file  Lifestyle  . Physical activity    Days per week: Not on file    Minutes per session: Not on file  . Stress: Not on file  Relationships  . Social Musicianconnections    Talks on phone: Not on file    Gets together: Not on file    Attends religious service: Not on file    Active member of club or organization: Not on file    Attends meetings of clubs or organizations: Not on file    Relationship status: Not on file  . Intimate partner violence    Fear of current or ex partner: Not on file    Emotionally abused: Not on file    Physically abused: Not on file    Forced sexual activity: Not on file  Other Topics Concern  . Not on file  Social History Narrative  . Not on file     Physical Exam  Vital Signs and Nursing Notes reviewed Vitals:   06/12/19 2045 06/12/19 2100  BP: (!) 138/97 (!) 135/99  Pulse: 81 82  Resp: (!) 33   Temp:    SpO2: 100% 98%    CONSTITUTIONAL: Well-appearing, NAD NEURO:  Alert and  oriented x 3, no focal deficits EYES:  eyes equal and reactive ENT/NECK:  no LAD, no JVD CARDIO: Regular rate, well-perfused, normal S1 and S2 PULM:  CTAB no wheezing or rhonchi GI/GU:  normal bowel sounds, non-distended, non-tender MSK/SPINE:  No gross deformities, no edema SKIN:  no rash, atraumatic PSYCH:  Appropriate speech and behavior  Diagnostic and Interventional Summary    EKG Interpretation  Date/Time:  Monday June 12 2019 19:06:17 EDT Ventricular Rate:  82 PR Interval:    QRS Duration: 79 QT Interval:  362 QTC Calculation: 423 R Axis:   54 Text Interpretation:  Sinus rhythm Low voltage, extremity leads Baseline wander in lead(s) V1 Confirmed by Gerlene Fee [phone]) on 06/12/2019 8:25:29 PM      Labs Reviewed  CBC - Abnormal; Notable for the following components:      Result Value   WBC 13.0 (*)    Platelets 439 (*)    All other components within normal limits  URINALYSIS,  ROUTINE W REFLEX MICROSCOPIC  LACTIC ACID, PLASMA  COMPREHENSIVE METABOLIC PANEL    No orders to display    Medications  sodium chloride 0.9 % bolus 1,000 mL (1,000 mLs Intravenous New Bag/Given 06/12/19 2152)  LORazepam (ATIVAN) injection 1 mg (1 mg Intravenous Given 06/12/19 2152)     Procedures Critical Care  ED Course and Medical Decision Making  I have reviewed the triage vital signs and the nursing notes.  Pertinent labs & imaging results that were available during my care of the patient were reviewed by me and considered in my medical decision making (see below for details).  Seizure likely in the setting of vomiting.  Patient has a longstanding history of nausea, vomiting and generalized abdominal pain.  Seems similar to prior.  Abdominal exam is reassuring, soft, nontender.  Awaiting laboratory evaluation.  11 PM update: Upon reassessment patient is feeling much better after IV Ativan, no longer in pain, abdominal exam again reassuring.  Still awaiting some laboratory evaluation, anticipating discharge.  Signed out to oncoming provider at shift change.  Barth Kirks. Sedonia Small, MD [place] [email or web address]  Final Clinical Impressions(s) / ED Diagnoses     ICD-10-CM   1. Seizure ([place])  R56.9   2. Generalized abdominal pain  R10.84     ED Discharge Orders    None      Discharge Instructions Discussed with and Provided to Patient: Discharge Instructions   None       Maudie Flakes, MD 06/12/19 2315

## 2019-06-12 NOTE — ED Notes (Signed)
Pt is aware he needs a urine sample. Pt has a urinal

## 2019-06-13 ENCOUNTER — Encounter (HOSPITAL_COMMUNITY): Payer: Self-pay | Admitting: Radiology

## 2019-06-13 ENCOUNTER — Emergency Department (HOSPITAL_COMMUNITY): Payer: Self-pay

## 2019-06-13 LAB — URINALYSIS, ROUTINE W REFLEX MICROSCOPIC
Bilirubin Urine: NEGATIVE
Glucose, UA: NEGATIVE mg/dL
Hgb urine dipstick: NEGATIVE
Ketones, ur: 20 mg/dL — AB
Leukocytes,Ua: NEGATIVE
Nitrite: NEGATIVE
Protein, ur: NEGATIVE mg/dL
Specific Gravity, Urine: 1.025 (ref 1.005–1.030)
pH: 6 (ref 5.0–8.0)

## 2019-06-13 LAB — I-STAT CHEM 8, ED
BUN: 9 mg/dL (ref 6–20)
Calcium, Ion: 1.2 mmol/L (ref 1.15–1.40)
Chloride: 109 mmol/L (ref 98–111)
Creatinine, Ser: 1.1 mg/dL (ref 0.61–1.24)
Glucose, Bld: 127 mg/dL — ABNORMAL HIGH (ref 70–99)
HCT: 43 % (ref 39.0–52.0)
Hemoglobin: 14.6 g/dL (ref 13.0–17.0)
Potassium: 4.1 mmol/L (ref 3.5–5.1)
Sodium: 144 mmol/L (ref 135–145)
TCO2: 24 mmol/L (ref 22–32)

## 2019-06-13 LAB — LACTIC ACID, PLASMA: Lactic Acid, Venous: 1.9 mmol/L (ref 0.5–1.9)

## 2019-06-13 MED ORDER — MORPHINE SULFATE (PF) 4 MG/ML IV SOLN
4.0000 mg | Freq: Once | INTRAVENOUS | Status: AC
  Administered 2019-06-13: 4 mg via INTRAVENOUS
  Filled 2019-06-13: qty 1

## 2019-06-13 MED ORDER — ONDANSETRON HCL 4 MG/2ML IJ SOLN
4.0000 mg | Freq: Once | INTRAMUSCULAR | Status: AC
  Administered 2019-06-13: 4 mg via INTRAVENOUS
  Filled 2019-06-13: qty 2

## 2019-06-13 MED ORDER — IOHEXOL 300 MG/ML  SOLN
100.0000 mL | Freq: Once | INTRAMUSCULAR | Status: AC | PRN
  Administered 2019-06-13: 100 mL via INTRAVENOUS

## 2019-06-13 NOTE — Discharge Instructions (Addendum)
Continue your medications as previously prescribed. ° °Follow-up with your primary doctor in the next week. °

## 2019-06-13 NOTE — ED Provider Notes (Signed)
Care assumed from Dr. Sedonia Small at shift change.  Patient awaiting results of laboratory studies after having a seizure.  Patient has remained seizure-free.  He has a history of seizure disorder as well as chronic abdominal pain.  The patient began complaining of severe abdominal pain upon my assessment.  A CT scan of the abdomen and pelvis was obtained which was unremarkable.  Patient's pain and nausea were treated and I feel now is appropriate for discharge.  He is to continue his prior medications and return to the ER for any problems.   Veryl Speak, MD 06/13/19 [phone]

## 2019-06-13 NOTE — ED Notes (Signed)
Pt requesting something to drink.  Ice chips given

## 2019-06-13 NOTE — ED Notes (Signed)
Pt requesting pain meds and nausea meds.  

## 2019-06-14 ENCOUNTER — Telehealth: Payer: Self-pay | Admitting: Internal Medicine

## 2019-06-14 NOTE — Telephone Encounter (Signed)
Forwarded to Dr. Newlin 

## 2019-06-14 NOTE — Telephone Encounter (Signed)
Patient wife states patient is needing to speak to someone about his symptoms and his nausea and vomiting states ED is not finding anything wrong. Please follow up

## 2019-06-15 ENCOUNTER — Emergency Department (HOSPITAL_COMMUNITY)
Admission: EM | Admit: 2019-06-15 | Discharge: 2019-06-15 | Disposition: A | Discharge status: Home / Self Care | Payer: Self-pay | Attending: Emergency Medicine | Admitting: Emergency Medicine

## 2019-06-15 ENCOUNTER — Emergency Department (HOSPITAL_COMMUNITY): Payer: Self-pay

## 2019-06-15 ENCOUNTER — Encounter (HOSPITAL_COMMUNITY): Payer: Self-pay | Admitting: Emergency Medicine

## 2019-06-15 ENCOUNTER — Other Ambulatory Visit: Payer: Self-pay

## 2019-06-15 ENCOUNTER — Encounter (HOSPITAL_COMMUNITY): Payer: Self-pay

## 2019-06-15 DIAGNOSIS — R197 Diarrhea, unspecified: Secondary | ICD-10-CM | POA: Insufficient documentation

## 2019-06-15 DIAGNOSIS — R1084 Generalized abdominal pain: Secondary | ICD-10-CM | POA: Insufficient documentation

## 2019-06-15 DIAGNOSIS — F1290 Cannabis use, unspecified, uncomplicated: Secondary | ICD-10-CM

## 2019-06-15 DIAGNOSIS — Z5321 Procedure and treatment not carried out due to patient leaving prior to being seen by health care provider: Secondary | ICD-10-CM | POA: Insufficient documentation

## 2019-06-15 DIAGNOSIS — F12988 Cannabis use, unspecified with other cannabis-induced disorder: Secondary | ICD-10-CM | POA: Insufficient documentation

## 2019-06-15 DIAGNOSIS — R112 Nausea with vomiting, unspecified: Secondary | ICD-10-CM | POA: Insufficient documentation

## 2019-06-15 DIAGNOSIS — R1110 Vomiting, unspecified: Secondary | ICD-10-CM | POA: Insufficient documentation

## 2019-06-15 DIAGNOSIS — K2901 Acute gastritis with bleeding: Secondary | ICD-10-CM | POA: Insufficient documentation

## 2019-06-15 DIAGNOSIS — R109 Unspecified abdominal pain: Secondary | ICD-10-CM | POA: Insufficient documentation

## 2019-06-15 LAB — CBC
HCT: 47.8 % (ref 39.0–52.0)
Hemoglobin: 16.8 g/dL (ref 13.0–17.0)
MCH: 32.7 pg (ref 26.0–34.0)
MCHC: 35.1 g/dL (ref 30.0–36.0)
MCV: 93.2 fL (ref 80.0–100.0)
Platelets: 395 10*3/uL (ref 150–400)
RBC: 5.13 MIL/uL (ref 4.22–5.81)
RDW: 13.6 % (ref 11.5–15.5)
WBC: 14.9 10*3/uL — ABNORMAL HIGH (ref 4.0–10.5)
nRBC: 0 % (ref 0.0–0.2)

## 2019-06-15 LAB — URINALYSIS, ROUTINE W REFLEX MICROSCOPIC
Glucose, UA: NEGATIVE mg/dL
Hgb urine dipstick: NEGATIVE
Ketones, ur: 80 mg/dL — AB
Leukocytes,Ua: NEGATIVE
Nitrite: NEGATIVE
Protein, ur: 100 mg/dL — AB
Specific Gravity, Urine: 1.04 — ABNORMAL HIGH (ref 1.005–1.030)
pH: 5 (ref 5.0–8.0)

## 2019-06-15 LAB — COMPREHENSIVE METABOLIC PANEL
ALT: 48 U/L — ABNORMAL HIGH (ref 0–44)
AST: 33 U/L (ref 15–41)
Albumin: 4.4 g/dL (ref 3.5–5.0)
Alkaline Phosphatase: 78 U/L (ref 38–126)
Anion gap: 14 (ref 5–15)
BUN: 9 mg/dL (ref 6–20)
CO2: 23 mmol/L (ref 22–32)
Calcium: 9.8 mg/dL (ref 8.9–10.3)
Chloride: 105 mmol/L (ref 98–111)
Creatinine, Ser: 1.35 mg/dL — ABNORMAL HIGH (ref 0.61–1.24)
GFR calc Af Amer: >60 mL/min (ref >60)
GFR calc non Af Amer: >60 mL/min (ref >60)
Glucose, Bld: 130 mg/dL — ABNORMAL HIGH (ref 70–99)
Potassium: 3.5 mmol/L (ref 3.5–5.1)
Sodium: 142 mmol/L (ref 135–145)
Total Bilirubin: 0.8 mg/dL (ref 0.3–1.2)
Total Protein: 7.9 g/dL (ref 6.5–8.1)

## 2019-06-15 LAB — LIPASE, BLOOD: Lipase: 23 U/L (ref 11–51)

## 2019-06-15 LAB — RAPID URINE DRUG SCREEN, HOSP PERFORMED
Amphetamines: NOT DETECTED
Barbiturates: NOT DETECTED
Benzodiazepines: NOT DETECTED
Cocaine: NOT DETECTED
Opiates: NOT DETECTED
Tetrahydrocannabinol: POSITIVE — AB

## 2019-06-15 LAB — POC OCCULT BLOOD, ED: Fecal Occult Bld: POSITIVE — AB

## 2019-06-15 MED ORDER — SODIUM CHLORIDE 0.9 % IV SOLN
80.0000 mg | Freq: Once | INTRAVENOUS | Status: DC
  Filled 2019-06-15 (×2): qty 80

## 2019-06-15 MED ORDER — HALOPERIDOL LACTATE 5 MG/ML IJ SOLN
5.0000 mg | Freq: Once | INTRAMUSCULAR | Status: AC
  Administered 2019-06-15: 5 mg via INTRAVENOUS
  Filled 2019-06-15: qty 1

## 2019-06-15 MED ORDER — ONDANSETRON 4 MG PO TBDP: 4.0000 mg | ORAL_TABLET | Freq: Three times a day (TID) | ORAL | 0 refills | Status: DC | PRN

## 2019-06-15 MED ORDER — DICYCLOMINE HCL 20 MG PO TABS: 20.0000 mg | ORAL_TABLET | Freq: Two times a day (BID) | ORAL | 0 refills | Status: DC

## 2019-06-15 MED ORDER — SODIUM CHLORIDE 0.9 % IV BOLUS
1000.0000 mL | Freq: Once | INTRAVENOUS | Status: AC
  Administered 2019-06-15: 1000 mL via INTRAVENOUS

## 2019-06-15 MED ORDER — PANTOPRAZOLE SODIUM 40 MG PO TBEC: 40.0000 mg | DELAYED_RELEASE_TABLET | Freq: Every day | ORAL | 0 refills | Status: DC

## 2019-06-15 MED ORDER — SODIUM CHLORIDE 0.9% FLUSH: 3.0000 mL | Freq: Once | INTRAVENOUS | Status: DC

## 2019-06-15 NOTE — ED Notes (Signed)
Patient verbalizes understanding of discharge instructions. Opportunity for questioning and answers were provided. Armband removed by staff, pt discharged from ED ambulatory.   

## 2019-06-15 NOTE — ED Triage Notes (Signed)
Per EMS- patient c/o lowe abdominal pain, N/V/D and body aches. Patient reported that he had a seizure sometime this AM, but states he doesn't know when.

## 2019-06-15 NOTE — Discharge Instructions (Addendum)
Take Protonix as prescribed. Take Zofran as needed for nausea and vomiting. STOP using marijuana, this makes your seizure meds less effective and can cause severe vomiting.  Call GI tomorrow to schedule follow-up appointment.  Return to ER for ongoing vomiting not controlled with your Zofran, worsening or concerning symptoms.

## 2019-06-15 NOTE — ED Notes (Signed)
Patient transported to [facility] 

## 2019-06-15 NOTE — ED Provider Notes (Signed)
[place] EMERGENCY DEPARTMENT Provider Note   CSN: [id number] Arrival date & time: 06/15/19  1324     History   Chief Complaint Chief Complaint  Patient presents with  . Abdominal Pain  . Emesis    HPI Greg Morales is a 33 y.o. male.     33 year old male presents with complaint of abdominal pain that 3 days ago associated with nausea and vomiting and diarrhea.  Abdominal pain is located in the periumbilical area, constant, described as "painful," does not radiate, is worse with palpation, nothing relieves pain.  Patient was seen in the emergency room at onset of pain, had a CT scan at that time and was discharged after work-up.  Patient reports symptoms have continued and not improving.  Reports history of prior abdominal pain without cause found.  Denies fevers, chills, sick contacts.  No prior abdominal surgeries.  No other complaints or concerns.     Past Medical History:  Diagnosis Date  . Chronic abdominal pain   . Medically noncompliant   . Polysubstance abuse ([place])   . Seizures ([place])    since childhood    Patient Active Problem List   Diagnosis Date Noted  . 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 Tobacco dependence 05/24/2018  . History of bipolar disorder 05/24/2018  . Elevated blood lead level 05/24/2018    Past Surgical History:  Procedure Laterality Date  . ESOPHAGOGASTRODUODENOSCOPY          Home Medications    Prior to Admission medications   Medication Sig Start Date End Date Taking? Authorizing Provider  lamoTRIgine (LAMICTAL) 100 MG tablet Take 1 tablet (100 mg total) by mouth daily. Take 3 tablets by mouth in the evening with meals 05/24/18   Ladell Pier, MD  levETIRAcetam (KEPPRA) 500 MG tablet Take 1 tablet (500 mg total) by mouth 3 (three) times daily. 11/14/18   Ladell Pier, MD  mirtazapine (REMERON) 15 MG tablet Take 1 tablet (15 mg total) by mouth at bedtime. Take 1/2 tablet by mouth at bedtime 05/24/18    Ladell Pier, MD  OLANZapine (ZYPREXA) 10 MG tablet Take 10 mg by mouth at bedtime. Take 4 tablets by mouth at bedtime    [provider]    Family History Family History  Problem Relation Age of Onset  . Hypertension Other   . Seizures Other   . Seizures Maternal Grandmother     Social History Social History   Tobacco Use  . Smoking status: Current Every Day Smoker    Packs/day: 1.00    Years: 5.00    Pack years: 5.00    Types: Cigarettes  . Smokeless tobacco: Never Used  Substance Use Topics  . Alcohol use: Not Currently  . Drug use: Not Currently    Types: Marijuana     Allergies   Patient has no known allergies.   Review of Systems Review of Systems  Constitutional: Negative for chills, diaphoresis and fever.  Respiratory: Negative for shortness of breath.   Cardiovascular: Negative for chest pain.  Gastrointestinal: Positive for abdominal pain, constipation, nausea and vomiting. Negative for blood in stool and diarrhea.  Genitourinary: Negative for dysuria, frequency and urgency.  Musculoskeletal: Negative for arthralgias and myalgias.  Skin: Negative for wound.  Allergic/Immunologic: Negative for immunocompromised state.  Psychiatric/Behavioral: Negative for confusion.  All other systems reviewed and are negative.    Physical Exam Updated Vital Signs BP (!) 144/101 (BP Location: Left Arm)  Pulse 80   Temp 97.8 F (36.6 C) (Oral)   Resp 16   Ht 5\' 6"  (1.676 m)   Wt 94.3 kg   SpO2 98%   BMI 33.57 kg/m   Physical Exam Vitals signs and nursing note reviewed.  Constitutional:      General: He is not in acute distress.    Appearance: He is well-developed. He is not diaphoretic.  HENT:     Head: Normocephalic and atraumatic.  Cardiovascular:     Rate and Rhythm: Normal rate and regular rhythm.     Heart sounds: Normal heart sounds.  Pulmonary:     Effort: Pulmonary effort is normal.     Breath sounds: Normal breath sounds.   Abdominal:     Palpations: Abdomen is soft.     Tenderness: There is abdominal tenderness in the right upper quadrant and epigastric area.     Hernia: A hernia is present. Hernia is present in the umbilical area.     Comments: Small, umbilical hernia, non tender, easily reduced.  Skin:    General: Skin is warm and dry.     Findings: No erythema or rash.  Neurological:     Mental Status: He is alert and oriented to person, place, and time.  Psychiatric:        Behavior: Behavior normal.      ED Treatments / Results  Labs (all labs ordered are listed, but only abnormal results are displayed) Labs Reviewed  COMPREHENSIVE METABOLIC PANEL - Abnormal; Notable for the following components:      Result Value   Glucose, Bld 130 (*)    Creatinine, Ser 1.35 (*)    ALT 48 (*)    All other components within normal limits  CBC - Abnormal; Notable for the following components:   WBC 14.9 (*)    All other components within normal limits  LIPASE, BLOOD  URINALYSIS, ROUTINE W REFLEX MICROSCOPIC  RAPID URINE DRUG SCREEN, HOSP PERFORMED    EKG None  Radiology No results found.  Procedures Procedures (including critical care time)  Medications Ordered in ED Medications  sodium chloride flush (NS) 0.9 % injection 3 mL (has no administration in time range)  haloperidol lactate (HALDOL) injection 5 mg (has no administration in time range)  sodium chloride 0.9 % bolus 1,000 mL (has no administration in time range)     Initial Impression / Assessment and Plan / ED Course  I have reviewed the triage vital signs and the nursing notes.  Pertinent labs & imaging results that were available during my care of the patient were reviewed by me and considered in my medical decision making (see chart for details).  Clinical Course as of Jun 15 2147  Thu Jun 15, 2019  2048 33 year old male presents with complaint of ongoing abdominal pain with nausea, vomiting, diarrhea.  Patient reports onset  of symptoms on Monday, seen in the ER at that time, had CT abdomen pelvis which was unremarkable.  Patient states he was not given anything for vomiting at that time and has continued to vomit at home.  On exam patient is well-appearing, has mild tenderness in the right upper quadrant.  Ultrasound right upper quadrant is unremarkable.  Review of lab work, patient has slight increase in his white count now at 14.9, lipase within normal limits, CMP with creatinine of 1.35.  Urinalysis positive for ketones and protein, small bilirubin.  UDS positive for marijuana.  Suspect cannabinoid hyperemesis, patient reports last used marijuana several  months ago.  Advised patient had vomiting today may very well be due to his marijuana use and to discontinue use.  Patient is tolerating p.o. fluids, and had one episode of emesis with dark red fluid concerning for blood, this fluid was checked on Hemoccult and is positive.  Patient given Protonix while in the emergency room.  Plan is to discharge home with Zofran ODT and Protonix, referral to GI for follow-up and recommend recheck with PCP.  Given return to ER precautions.   [LM]    Clinical Course User Index [LM] Jeannie FendMurphy,  A, PA-C      Final Clinical Impressions(s) / ED Diagnoses   Final diagnoses:  None    ED Discharge Orders    None       Alden HippMurphy,  A, PA-C 06/15/19 2148    Alvira MondaySchlossman, Erin, MD 06/15/19 2208

## 2019-06-15 NOTE — Telephone Encounter (Signed)
Please schedule an appointment.

## 2019-06-15 NOTE — ED Triage Notes (Signed)
Pt arrives POV with IV in his hand. Pt states he left [facility]. Pt reports abd pain, n/v/d since Monday.

## 2019-06-20 ENCOUNTER — Other Ambulatory Visit: Payer: Self-pay

## 2019-06-20 ENCOUNTER — Ambulatory Visit: Payer: Self-pay | Attending: Family Medicine | Admitting: Physician Assistant

## 2019-06-20 DIAGNOSIS — Z09 Encounter for follow-up examination after completed treatment for conditions other than malignant neoplasm: Secondary | ICD-10-CM

## 2019-06-20 DIAGNOSIS — K921 Melena: Secondary | ICD-10-CM

## 2019-06-20 DIAGNOSIS — R110 Nausea: Secondary | ICD-10-CM

## 2019-06-20 MED ORDER — PANTOPRAZOLE SODIUM 40 MG PO TBEC: 40.0000 mg | DELAYED_RELEASE_TABLET | Freq: Every day | ORAL | 3 refills | Status: DC

## 2019-06-20 MED ORDER — ONDANSETRON 4 MG PO TBDP: 4.0000 mg | ORAL_TABLET | Freq: Three times a day (TID) | ORAL | 1 refills | Status: DC | PRN

## 2019-06-20 NOTE — Progress Notes (Signed)
Virtual Visit via Telephone Note  I connected with LORENA CLEARMAN on 06/20/19 at  9:50 AM EDT by telephone and verified that I am speaking with the correct person using two identifiers.   I discussed the limitations, risks, security and privacy concerns of performing an evaluation and management service by telephone and the availability of in person appointments. I also discussed with the patient that there may be a patient responsible charge related to this service. The patient expressed understanding and agreed to proceed.  Patient location:  home My Location:  [facility] office Persons on the call:  Me and the patient   History of Present Illness: Seen in the ED 06/15/2019.  No further abdominal pain.  +some dark stools.  No brbpr.  No vomiting.  Appetite is good.  No diarrhea or constipation.  zofran is helping with nausea.  No fever.  No UTI s/sx.  takin protonix daily and zofran prn.  No dizziness or weakness.    From ED note: 33 year old male presents with complaint of ongoing abdominal pain with nausea, vomiting, diarrhea.  Patient reports onset of symptoms on Monday, seen in the ER at that time, had CT abdomen pelvis which was unremarkable.  Patient states he was not given anything for vomiting at that time and has continued to vomit at home.  On exam patient is well-appearing, has mild tenderness in the right upper quadrant.  Ultrasound right upper quadrant is unremarkable.  Review of lab work, patient has slight increase in his white count now at 14.9, lipase within normal limits, CMP with creatinine of 1.35.  Urinalysis positive for ketones and protein, small bilirubin.  UDS positive for marijuana.  Suspect cannabinoid hyperemesis, patient reports last used marijuana several months ago.  Advised patient had vomiting today may very well be due to his marijuana use and to discontinue use.  Patient is tolerating p.o. fluids, and had one episode of emesis with dark red fluid concerning for blood,  this fluid was checked on Hemoccult and is positive.  Patient given Protonix while in the emergency room.  Plan is to discharge home with Zofran ODT and Protonix, referral to GI for follow-up and recommend recheck with PCP.  Given return to ER precautions.     Observations/Objective:  NAD.  A&Ox3   Assessment and Plan: 1. Melena Heme+ stool in ED - Ambulatory referral to Gastroenterology - pantoprazole (PROTONIX) 40 MG tablet; Take 1 tablet (40 mg total) by mouth daily.  Dispense: 30 tablet; Refill: 3  2. Nausea - Ambulatory referral to Gastroenterology - ondansetron (ZOFRAN ODT) 4 MG disintegrating tablet; Take 1 tablet (4 mg total) by mouth every 8 (eight) hours as needed for nausea or vomiting.  Dispense: 30 tablet; Refill: 1  3. Encounter for examination following treatment at hospital Feels good overall    Follow Up Instructions: See PCP as planned   I discussed the assessment and treatment plan with the patient. The patient was provided an opportunity to ask questions and all were answered. The patient agreed with the plan and demonstrated an understanding of the instructions.   The patient was advised to call back or seek an in-person evaluation if the symptoms worsen or if the condition fails to improve as anticipated.  I provided 11 minutes of non-face-to-face time during this encounter.   Freeman Caldron, PA-C  Patient ID: DENISE BRAMBLETT, male   DOB: March 30, 1986, 33 y.o.   MRN: [id number]

## 2019-06-20 NOTE — Progress Notes (Signed)
Patient verified DOB Patient has not eaten today.  Patient not taken medication today. Nausea episode yesterday with no vomiting and no fevers

## 2019-06-28 ENCOUNTER — Other Ambulatory Visit: Payer: Self-pay

## 2019-06-28 ENCOUNTER — Telehealth: Payer: Self-pay | Admitting: Internal Medicine

## 2019-06-28 ENCOUNTER — Ambulatory Visit: Payer: Self-pay | Attending: Internal Medicine

## 2019-06-28 NOTE — Telephone Encounter (Signed)
I called the Pt to go over the [place] and [place] application, He informed me that his wife work, that she file taxes separate, for this reason I asking  that we need a copy for his wife pay-stub for the last 3 month, wife taxes, also we need a letter from the IRS a non filling letter from him since he does not file taxes together, an copy of utility bills from him or wife, at this time he was cursing me saying that he does not have utility, I try to explain him and he course me again and I said excuse me and he hang up the phone, for this reason I will return his application to him by mail since he is not cooperating with the question and documents we need it from him

## 2019-07-03 ENCOUNTER — Ambulatory Visit: Payer: Self-pay | Admitting: Nurse Practitioner

## 2019-07-27 ENCOUNTER — Encounter: Payer: Self-pay | Admitting: Internal Medicine

## 2019-07-27 ENCOUNTER — Other Ambulatory Visit: Payer: Self-pay

## 2019-07-27 ENCOUNTER — Ambulatory Visit: Payer: Self-pay | Attending: Internal Medicine | Admitting: Internal Medicine

## 2019-07-27 VITALS — BP 119/82 | HR 78 | Temp 98.5°F | Resp 16 | Wt 179.8 lb

## 2019-07-27 DIAGNOSIS — F17200 Nicotine dependence, unspecified, uncomplicated: Secondary | ICD-10-CM

## 2019-07-27 DIAGNOSIS — G40409 Other generalized epilepsy and epileptic syndromes, not intractable, without status epilepticus: Secondary | ICD-10-CM

## 2019-07-27 DIAGNOSIS — IMO0001 Reserved for inherently not codable concepts without codable children: Secondary | ICD-10-CM

## 2019-07-27 DIAGNOSIS — R911 Solitary pulmonary nodule: Secondary | ICD-10-CM

## 2019-07-27 MED ORDER — LEVETIRACETAM 500 MG PO TABS: 500.0000 mg | ORAL_TABLET | Freq: Three times a day (TID) | ORAL | 6 refills | Status: DC

## 2019-07-27 NOTE — Progress Notes (Signed)
Patient ID: Greg Morales, male    DOB: 08/05/1986  MRN: [id number]  CC: Follow-up   Subjective: Greg Morales is a 33 y.o. male who presents for chronic ds management His concerns today include:  History of seizure disorder, Bipolar (followed by [facility]), left lung nodule, tob dep.  I last spoke with   SZ;  No sz since I last spoke with him.  Reports compliance with Keppra.  Plan to refer to neurology.  I told him to apply for the orange card/cone discount.  He did turn in the application but did not have all the supporting documents.  Patient states he is not working and he is unable to get the supporting documents that are needed.  Financial advisor called him on the phone to advise him of of the documents that are needed but patient reportedly became belligerent  Tob dep:  Not ready to quit.    Was seen in the emergency room last month with some gastrointestinal symptoms.  Subsequently followed up with PA and was referred to GI.  Patient states he was called by the gastroenterologist office.  He was no longer having symptoms so they told him that he does not need to be seen if his symptoms have resolved.  He is okay with this..    LT lung nodule: 1.4 cm new subpleural nodule noted in the left lung base incidentally on CT scan of the abdomen done 04/2019.  Radiologist recommended a repeat CAT scan in 3 months to ensure stability.  Patient denies any chronic cough.   Patient Active Problem List   Diagnosis Date Noted  . Grand mal seizure ([place]) 05/24/2018  . Tobacco dependence 05/24/2018  . History of bipolar disorder 05/24/2018  . Elevated blood lead level 05/24/2018     Current Outpatient Medications on File Prior to Visit  Medication Sig Dispense Refill  . dicyclomine (BENTYL) 20 MG tablet Take 1 tablet (20 mg total) by mouth 2 (two) times daily. 20 tablet 0  . lamoTRIgine (LAMICTAL) 100 MG tablet Take 1 tablet (100 mg total) by mouth daily. Take 3 tablets by mouth in the evening  with meals 90 tablet 6  . levETIRAcetam (KEPPRA) 500 MG tablet Take 1 tablet (500 mg total) by mouth 3 (three) times daily. 90 tablet 6  . mirtazapine (REMERON) 15 MG tablet Take 1 tablet (15 mg total) by mouth at bedtime. Take 1/2 tablet by mouth at bedtime 30 tablet 6  . OLANZapine (ZYPREXA) 10 MG tablet Take 10 mg by mouth at bedtime. Take 4 tablets by mouth at bedtime    . ondansetron (ZOFRAN ODT) 4 MG disintegrating tablet Take 1 tablet (4 mg total) by mouth every 8 (eight) hours as needed for nausea or vomiting. 30 tablet 1  . pantoprazole (PROTONIX) 40 MG tablet Take 1 tablet (40 mg total) by mouth daily. 30 tablet 3   No current facility-administered medications on file prior to visit.     No Known Allergies  Social History   Socioeconomic History  . Marital status: Single    Spouse name: Not on file  . Number of children: Not on file  . Years of education: Not on file  . Highest education level: Not on file  Occupational History  . Not on file  Social Needs  . Financial resource strain: Not on file  . Food insecurity    Worry: Not on file    Inability: Not on file  . Transportation needs  Medical: Not on file    Non-medical: Not on file  Tobacco Use  . Smoking status: Current Every Day Smoker    Packs/day: 1.00    Years: 5.00    Pack years: 5.00    Types: Cigarettes  . Smokeless tobacco: Never Used  Substance and Sexual Activity  . Alcohol use: Not Currently  . Drug use: Not Currently    Types: Marijuana  . Sexual activity: Not Currently  Lifestyle  . Physical activity    Days per week: Not on file    Minutes per session: Not on file  . Stress: Not on file  Relationships  . Social Musicianconnections    Talks on phone: Not on file    Gets together: Not on file    Attends religious service: Not on file    Active member of club or organization: Not on file    Attends meetings of clubs or organizations: Not on file    Relationship status: Not on file  . Intimate  partner violence    Fear of current or ex partner: Not on file    Emotionally abused: Not on file    Physically abused: Not on file    Forced sexual activity: Not on file  Other Topics Concern  . Not on file  Social History Narrative  . Not on file    Family History  Problem Relation Age of Onset  . Hypertension Other   . Seizures Other   . Seizures Maternal Grandmother     Past Surgical History:  Procedure Laterality Date  . ESOPHAGOGASTRODUODENOSCOPY      ROS: Review of Systems Negative except as stated above  PHYSICAL EXAM: BP 119/82   Pulse 78   Temp 98.5 F (36.9 C) (Oral)   Resp 16   Wt 179 lb 12.8 oz (81.6 kg)   SpO2 98%   BMI 29.02 kg/m   Physical Exam  General appearance - alert, well appearing, and in no distress Mental status - normal mood, behavior, speech, dress, motor activity, and thought processes Mouth - mucous membranes moist, pharynx normal without lesions Chest - clear to auscultation, no wheezes, rales or rhonchi, symmetric air entry Heart - normal rate, regular rhythm, normal S1, S2, no murmurs, rubs, clicks or gallops Neurological - cranial nerves II through XII intact, motor and sensory grossly normal bilaterally  CMP Latest Ref Rng & Units 06/15/2019 06/13/2019 05/22/2019  Glucose 70 - 99 mg/dL 119(J130(H) 478(G127(H) 93  BUN 6 - 20 mg/dL 9 9 7   Creatinine 0.61 - 1.24 mg/dL 9.56(O1.35(H) 1.301.10 8.651.09  Sodium 135 - 145 mmol/L 142 144 142  Potassium 3.5 - 5.1 mmol/L 3.5 4.1 3.8  Chloride 98 - 111 mmol/L 105 109 106  CO2 22 - 32 mmol/L 23 - 21  Calcium 8.9 - 10.3 mg/dL 9.8 - 9.2  Total Protein 6.5 - 8.1 g/dL 7.9 - 6.9  Total Bilirubin 0.3 - 1.2 mg/dL 0.8 - 0.2  Alkaline Phos 38 - 126 U/L 78 - 72  AST 15 - 41 U/L 33 - 20  ALT 0 - 44 U/L 48(H) - 37   Lipid Panel  No results found for: CHOL, TRIG, HDL, CHOLHDL, VLDL, LDLCALC, LDLDIRECT  CBC    Component Value Date/Time   WBC 14.9 (H) 06/15/2019 1342   RBC 5.13 06/15/2019 1342   HGB 16.8 06/15/2019  1342   HGB 16.0 05/24/2018 1538   HCT 47.8 06/15/2019 1342   HCT 46.0 05/24/2018 1538   PLT 395  06/15/2019 1342   PLT 378 05/24/2018 1538   MCV 93.2 06/15/2019 1342   MCV 92 05/24/2018 1538   MCH 32.7 06/15/2019 1342   MCHC 35.1 06/15/2019 1342   RDW 13.6 06/15/2019 1342   RDW 14.6 05/24/2018 1538   LYMPHSABS 1.5 05/17/2019 1813   MONOABS 0.5 05/17/2019 1813   EOSABS 0.1 05/17/2019 1813   BASOSABS 0.1 05/17/2019 1813    ASSESSMENT AND PLAN: 1. 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) Continue Keppra - Levetiracetam level - levETIRAcetam (KEPPRA) 500 MG tablet; Take 1 tablet (500 mg total) by mouth 3 (three) times daily.  Dispense: 90 tablet; Refill: 6  2. Tobacco dependence Advised to quit.  Discussed health risks associated with smoking.  He is not ready to give a trial of quitting.  Less than 5 minutes spent on counseling  3. Lung nodule < 6cm on CT Plan to repeat CAT scan the end of next month     Patient was given the opportunity to ask questions.  Patient verbalized understanding of the plan and was able to repeat key elements of the plan.   No orders of the defined types were placed in this encounter.    Requested Prescriptions    No prescriptions requested or ordered in this encounter    No follow-ups on file.  Jonah Blue, MD, FACP

## 2019-07-29 ENCOUNTER — Telehealth: Payer: Self-pay | Admitting: Internal Medicine

## 2019-07-29 DIAGNOSIS — G40409 Other generalized epilepsy and epileptic syndromes, not intractable, without status epilepticus: Secondary | ICD-10-CM

## 2019-07-29 LAB — LEVETIRACETAM LEVEL: Levetiracetam Lvl: 7.9 ug/mL — ABNORMAL LOW (ref 10.0–40.0)

## 2019-07-29 NOTE — Telephone Encounter (Signed)
Tried to reach pt today to discuss lab result.  Keppra level low.  Unable to reach pt.  He has a VM box that is not set up yet.  Tried to reach spouse but got VM.  I did not leave message.  Will have my CMA try to reach him nextt wk.

## 2019-07-31 MED ORDER — LEVETIRACETAM 500 MG PO TABS: ORAL_TABLET | ORAL | 6 refills | Status: DC

## 2019-07-31 NOTE — Telephone Encounter (Signed)
Contacted Greg Morales to go over Dr. Wynetta Emery message. Greg Morales states he is taking medication but he is not taking it 3xs a day because it makes him drowsy

## 2019-08-01 NOTE — Telephone Encounter (Signed)
Contacted pt and went over Dr. Johnson response pt doesn't have any questions or concerns  

## 2019-08-28 ENCOUNTER — Ambulatory Visit (HOSPITAL_COMMUNITY): Admission: RE | Admit: 2019-08-28 | Payer: Self-pay | Source: Ambulatory Visit

## 2019-11-03 ENCOUNTER — Encounter: Payer: Self-pay | Admitting: Internal Medicine

## 2019-11-03 ENCOUNTER — Other Ambulatory Visit: Payer: Self-pay

## 2019-11-03 ENCOUNTER — Ambulatory Visit: Payer: Self-pay | Attending: Internal Medicine | Admitting: Internal Medicine

## 2019-11-03 VITALS — BP 116/78 | HR 91 | Temp 97.1°F | Resp 16 | Wt 191.0 lb

## 2019-11-03 DIAGNOSIS — G40409 Other generalized epilepsy and epileptic syndromes, not intractable, without status epilepticus: Secondary | ICD-10-CM

## 2019-11-03 DIAGNOSIS — F17200 Nicotine dependence, unspecified, uncomplicated: Secondary | ICD-10-CM

## 2019-11-03 DIAGNOSIS — R911 Solitary pulmonary nodule: Secondary | ICD-10-CM

## 2019-11-03 DIAGNOSIS — IMO0001 Reserved for inherently not codable concepts without codable children: Secondary | ICD-10-CM

## 2019-11-03 NOTE — Progress Notes (Signed)
Pt is requesting a letter for support for his lawyer

## 2019-11-03 NOTE — Progress Notes (Signed)
Patient ID: KOU GUCCIARDO, male    DOB: [birth year]  MRN: [id number]  CC: Letter for School/Work   Subjective: Greg Morales is a 34 y.o. male who presents for f/u visit to request letter in support of disability. His concerns today include:  History of seizure disorder, Bipolar (followed by [facility]), left lung nodule (seen on CT 04/2019), tob dep. Last seen 06/2019  Patient presents today requesting a letter to take to his lawyer in support of disability given his history of seizure disorder.  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  He was let go because he was having seizures on the job.  Patient states he recently tried to get a job through the [employer] and employment was declined because of his history of seizures and also he has a felony record.  Last seizure was November of last year.  He reports compliance with taking Keppra but has been out of it for 2 days due to lack of finances.  He smokes a pack of cigarettes a day.  He states that his mother pays for the cigarettes for him. -Plan was to refer him to neurology but he never got straight with completing the process to get the orange card/cone discount.  He was requested to bring additional supporting documents of wife's salary since he does not work.  Patient felt that it was not relevant  Lung nodule: He is due for follow-up CAT scan of the chest for evaluation of the 1.4 cm lung nodule seen on CAT scan of the abdomen in August of last year.  Patient denies shortness of breath or cough.  He smokes a pack of cigarettes a day.  He is not quite ready to quit. Patient Active Problem List   Diagnosis Date Noted  . Lung nodule < 6cm on CT 07/27/2019  . Grand mal seizure ([place]) 05/24/2018  . Tobacco dependence 05/24/2018  . History of bipolar disorder 05/24/2018  . Elevated blood lead level 05/24/2018     Current Outpatient Medications on File Prior to Visit  Medication Sig Dispense Refill  .  dicyclomine (BENTYL) 20 MG tablet Take 1 tablet (20 mg total) by mouth 2 (two) times daily. 20 tablet 0  . lamoTRIgine (LAMICTAL) 100 MG tablet Take 1 tablet (100 mg total) by mouth daily. Take 3 tablets by mouth in the evening with meals 90 tablet 6  . levETIRAcetam (KEPPRA) 500 MG tablet Take 2 tabs PO Q a.m and 1 tab Po Q p.m 90 tablet 6  . mirtazapine (REMERON) 15 MG tablet Take 1 tablet (15 mg total) by mouth at bedtime. Take 1/2 tablet by mouth at bedtime 30 tablet 6  . OLANZapine (ZYPREXA) 10 MG tablet Take 10 mg by mouth at bedtime. Take 4 tablets by mouth at bedtime    . ondansetron (ZOFRAN ODT) 4 MG disintegrating tablet Take 1 tablet (4 mg total) by mouth every 8 (eight) hours as needed for nausea or vomiting. 30 tablet 1  . pantoprazole (PROTONIX) 40 MG tablet Take 1 tablet (40 mg total) by mouth daily. 30 tablet 3   No current facility-administered medications on file prior to visit.    No Known Allergies  Social History   Socioeconomic History  . Marital status: Single    Spouse name: Not on file  . Number of children: Not on file  . Years of education: Not on file  . Highest education level: Not on file  Occupational History  . Not on file  Tobacco Use  . Smoking status: Current Every Day Smoker    Packs/day: 1.00    Years: 5.00    Pack years: 5.00    Types: Cigarettes  . Smokeless tobacco: Never Used  Substance and Sexual Activity  . Alcohol use: Not Currently  . Drug use: Not Currently    Types: Marijuana  . Sexual activity: Not Currently  Other Topics Concern  . Not on file  Social History Narrative  . Not on file   Social Determinants of Health   Financial Resource Strain:   . Difficulty of Paying Living Expenses: Not on file  Food Insecurity:   . Worried About Programme researcher, broadcasting/film/video in the Last Year: Not on file  . Ran Out of Food in the Last Year: Not on file  Transportation Needs:   . Lack of Transportation (Medical): Not on file  . Lack of  Transportation (Non-Medical): Not on file  Physical Activity:   . Days of Exercise per Week: Not on file  . Minutes of Exercise per Session: Not on file  Stress:   . Feeling of Stress : Not on file  Social Connections:   . Frequency of Communication with Friends and Family: Not on file  . Frequency of Social Gatherings with Friends and Family: Not on file  . Attends Religious Services: Not on file  . Active Member of Clubs or Organizations: Not on file  . Attends Banker Meetings: Not on file  . Marital Status: Not on file  Intimate Partner Violence:   . Fear of Current or Ex-Partner: Not on file  . Emotionally Abused: Not on file  . Physically Abused: Not on file  . Sexually Abused: Not on file    Family History  Problem Relation Age of Onset  . Hypertension Other   . Seizures Other   . Seizures Maternal Grandmother     Past Surgical History:  Procedure Laterality Date  . ESOPHAGOGASTRODUODENOSCOPY      ROS: Review of Systems Negative except as stated above  PHYSICAL EXAM: BP 116/78   Pulse 91   Temp (!) 97.1 F (36.2 C)   Resp 16   Wt 191 lb (86.6 kg)   SpO2 97%   BMI 30.83 kg/m   Physical Exam General appearance - alert, well appearing, and in no distress Mental status - normal mood, behavior, speech, dress, motor activity, and thought processes Chest - clear to auscultation, no wheezes, rales or rhonchi, symmetric air entry Heart - normal rate, regular rhythm, normal S1, S2, no murmurs, rubs, clicks or gallops  CMP Latest Ref Rng & Units 06/15/2019 06/13/2019 05/22/2019  Glucose 70 - 99 mg/dL 315(V) 761(Y) 93  BUN 6 - 20 mg/dL 9 9 7   Creatinine 0.61 - 1.24 mg/dL ) 0.73(X 1.06  Sodium 135 - 145 mmol/L 142 144 142  Potassium 3.5 - 5.1 mmol/L 3.5 4.1 3.8  Chloride 98 - 111 mmol/L 105 109 106  CO2 22 - 32 mmol/L 23 - 21  Calcium 8.9 - 10.3 mg/dL 9.8 - 9.2  Total Protein 6.5 - 8.1 g/dL 7.9 - 6.9  Total Bilirubin 0.3 - 1.2 mg/dL 0.8 - 0.2   Alkaline Phos 38 - 126 U/L 78 - 72  AST 15 - 41 U/L 33 - 20  ALT 0 - 44 U/L 48(H) - 37   Lipid Panel  No results found for: CHOL, TRIG, HDL, CHOLHDL, VLDL, LDLCALC, LDLDIRECT  CBC  Component Value Date/Time   WBC 14.9 (H) 06/15/2019 1342   RBC 5.13 06/15/2019 1342   HGB 16.8 06/15/2019 1342   HGB 16.0 05/24/2018 1538   HCT 47.8 06/15/2019 1342   HCT 46.0 05/24/2018 1538   PLT 395 06/15/2019 1342   PLT 378 05/24/2018 1538   MCV 93.2 06/15/2019 1342   MCV 92 05/24/2018 1538   MCH 32.7 06/15/2019 1342   MCHC 35.1 06/15/2019 1342   RDW 13.6 06/15/2019 1342   RDW 14.6 05/24/2018 1538   LYMPHSABS 1.5 05/17/2019 1813   MONOABS 0.5 05/17/2019 1813   EOSABS 0.1 05/17/2019 1813   BASOSABS 0.1 05/17/2019 1813    ASSESSMENT AND PLAN: 1. Grand mal seizure [facility]) -Advised patient that having seizure disorder in and of itself does not necessarily make one disabled.  His seizures are well controlled when he takes the Keppra consistently -I do not think that he is totally disabled.  He can work with certain restrictions namely no working at [employer] or with heavy machinery.  I recommend referring him to vocational rehab who can help him obtain employment given his restrictions. -I have written a letter for him stating that he does have seizure disorder and work limitations that would be needed. -Encouraged him to take the Keppra every day.  He will check with the pharmacy to see whether they will allow him to fill his medicine today.    2. Lung nodule < 6cm on CT Due for follow-up CT on the lung nodule seen in the left lung.  I have strongly encouraged him to discontinue smoking.  Discussed health risks associated with smoking.  Patient not willing to give a trial of quitting.  Less than 5 minutes spent on counseling - CT Chest Wo Contrast; Future - Basic Metabolic Panel  3. Tobacco dependence See #2 above  Addendum: After the visit was over, patient came back and gave my CMA a form  that he states his lawyer wanted me to fill out regarding his ability to work.  Patient was given the opportunity to ask questions.  Patient verbalized understanding of the plan and was able to repeat key elements of the plan.   Orders Placed This Encounter  Procedures  . CT Chest Wo Contrast  . Basic Metabolic Panel     Requested Prescriptions    No prescriptions requested or ordered in this encounter    No follow-ups on file.  Jonah Blue, MD, FACP

## 2019-11-04 LAB — BASIC METABOLIC PANEL
BUN/Creatinine Ratio: 9 (ref 9–20)
BUN: 11 mg/dL (ref 6–20)
CO2: 16 mmol/L — ABNORMAL LOW (ref 20–29)
Calcium: 10.1 mg/dL (ref 8.7–10.2)
Chloride: 106 mmol/L (ref 96–106)
Creatinine, Ser: 1.28 mg/dL — ABNORMAL HIGH (ref 0.76–1.27)
GFR calc Af Amer: 84 mL/min/{1.73_m2} (ref >59)
GFR calc non Af Amer: 73 mL/min/{1.73_m2} (ref >59)
Glucose: 100 mg/dL — ABNORMAL HIGH (ref 65–99)
Potassium: 4.1 mmol/L (ref 3.5–5.2)
Sodium: 142 mmol/L (ref 134–144)

## 2019-11-07 ENCOUNTER — Telehealth: Payer: Self-pay

## 2019-11-07 NOTE — Telephone Encounter (Signed)
Contacted pt to go over lab results pt is aware and doesn't have any questions or concerns 

## 2019-11-08 ENCOUNTER — Ambulatory Visit (HOSPITAL_COMMUNITY)
Admission: RE | Admit: 2019-11-08 | Discharge: 2019-11-08 | Disposition: A | Discharge status: Home / Self Care | Payer: Self-pay | Source: Ambulatory Visit | Attending: Internal Medicine | Admitting: Internal Medicine

## 2019-11-08 ENCOUNTER — Other Ambulatory Visit: Payer: Self-pay

## 2019-11-08 DIAGNOSIS — R911 Solitary pulmonary nodule: Secondary | ICD-10-CM | POA: Insufficient documentation

## 2019-11-08 DIAGNOSIS — IMO0001 Reserved for inherently not codable concepts without codable children: Secondary | ICD-10-CM

## 2019-11-10 ENCOUNTER — Telehealth: Payer: Self-pay | Admitting: Internal Medicine

## 2019-11-10 NOTE — Telephone Encounter (Signed)
Patient called requesting his CT scan results. Please f/u

## 2019-11-13 NOTE — Telephone Encounter (Signed)
Pt called requesting results on his last Xray. Name and DOB verified.  As per PCP instructions results and results MD note was given/ Verbalized understanding    "Let patient know that the small nodule seen in the left lung is unchanged in size compared to the CAT scan done in September of last year. We will need to repeat the study again in 1 year to make sure that it remains unchanged".

## 2019-12-06 ENCOUNTER — Other Ambulatory Visit: Payer: Self-pay

## 2019-12-06 ENCOUNTER — Ambulatory Visit: Payer: Self-pay | Attending: Internal Medicine | Admitting: Licensed Clinical Social Worker

## 2019-12-06 DIAGNOSIS — F419 Anxiety disorder, unspecified: Secondary | ICD-10-CM

## 2019-12-06 NOTE — BH Specialist Note (Signed)
Integrated Behavioral Health Visit via Telemedicine (Telephone)  12/06/2019 Greg Morales [id number]   Session Start time: 9:05 AM  Session End time: 9:10 AM Total time: 10  Referring Provider: Dr. Laural Benes Type of Visit: Telephonic Patient location: Home [facility] Provider location: Office All persons participating in visit: Pt and LCSW  Confirmed patient's address: Yes  Confirmed patient's phone number: Yes  Any changes to demographics: No   Confirmed patient's insurance: Yes  Any changes to patient's insurance: No   Discussed confidentiality: Yes    The following statements were read to the patient and/or legal guardian that are established with the [facility] Provider.  "The purpose of this phone visit is to provide behavioral health care while limiting exposure to the coronavirus (COVID19).  There is a possibility of technology failure and discussed alternative modes of communication if that failure occurs."  "By engaging in this telephone visit, you consent to the provision of healthcare.  Additionally, you authorize for your insurance to be billed for the services provided during this telephone visit."   Patient and/or legal guardian consented to telephone visit: Yes   PRESENTING CONCERNS: Patient and/or family reports the following symptoms/concerns: Pt reports that he is unable to work due to seizure disorder. He receives medication management through [facility] to assist with behavioral health conditions Duration of problem: Ongoing; Severity of problem: moderate  STRENGTHS (Protective Factors/Coping Skills): Pt has good insight Pt receives support  GOALS ADDRESSED: Patient will: 1.  Reduce symptoms of: anxiety  2.  Increase knowledge and/or ability of: self-management skills  3.  Demonstrate ability to: Increase healthy adjustment to current life circumstances and Increase adequate support systems for patient/family  INTERVENTIONS: Interventions utilized:   Solution-Focused Strategies Standardized Assessments completed: Not Needed  ASSESSMENT: Patient currently experiencing anxiety and stress triggered by medical condition. 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    LCSW informed patient about Vocational Rehab and discussed process of receiving services. Pt provided verbal consent for LCSW to send information to address on file. LCSW will provide update to PCP.   PLAN: 1. Follow up with behavioral health clinician on : Contact LCSW with behavioral health and/or resource needs 2. Behavioral recommendations: Initiate services with Vocational Rehab and continue to follow up with [facility] regarding behavioral health conditions 3. Referral(s): Integrated [employer] (In Clinic)  Bridgett Larsson, [place] 12/06/2019 9:31 AM

## 2020-02-05 ENCOUNTER — Emergency Department (HOSPITAL_COMMUNITY)
Admission: EM | Admit: 2020-02-05 | Discharge: 2020-02-05 | Disposition: A | Discharge status: Home / Self Care | Payer: Self-pay | Attending: Emergency Medicine | Admitting: Emergency Medicine

## 2020-02-05 ENCOUNTER — Encounter (HOSPITAL_COMMUNITY): Payer: Self-pay

## 2020-02-05 ENCOUNTER — Other Ambulatory Visit: Payer: Self-pay

## 2020-02-05 ENCOUNTER — Emergency Department (HOSPITAL_COMMUNITY): Payer: Self-pay

## 2020-02-05 DIAGNOSIS — R112 Nausea with vomiting, unspecified: Secondary | ICD-10-CM | POA: Insufficient documentation

## 2020-02-05 DIAGNOSIS — R1013 Epigastric pain: Secondary | ICD-10-CM | POA: Insufficient documentation

## 2020-02-05 DIAGNOSIS — R197 Diarrhea, unspecified: Secondary | ICD-10-CM | POA: Insufficient documentation

## 2020-02-05 DIAGNOSIS — F17210 Nicotine dependence, cigarettes, uncomplicated: Secondary | ICD-10-CM | POA: Insufficient documentation

## 2020-02-05 DIAGNOSIS — R630 Anorexia: Secondary | ICD-10-CM | POA: Insufficient documentation

## 2020-02-05 LAB — COMPREHENSIVE METABOLIC PANEL
ALT: 26 U/L (ref 0–44)
AST: 22 U/L (ref 15–41)
Albumin: 4.2 g/dL (ref 3.5–5.0)
Alkaline Phosphatase: 53 U/L (ref 38–126)
Anion gap: 11 (ref 5–15)
BUN: 9 mg/dL (ref 6–20)
CO2: 24 mmol/L (ref 22–32)
Calcium: 9.2 mg/dL (ref 8.9–10.3)
Chloride: 106 mmol/L (ref 98–111)
Creatinine, Ser: 1.14 mg/dL (ref 0.61–1.24)
GFR calc Af Amer: >60 mL/min (ref >60)
GFR calc non Af Amer: >60 mL/min (ref >60)
Glucose, Bld: 126 mg/dL — ABNORMAL HIGH (ref 70–99)
Potassium: 3.2 mmol/L — ABNORMAL LOW (ref 3.5–5.1)
Sodium: 141 mmol/L (ref 135–145)
Total Bilirubin: 0.7 mg/dL (ref 0.3–1.2)
Total Protein: 7.2 g/dL (ref 6.5–8.1)

## 2020-02-05 LAB — CBC
HCT: 45.1 % (ref 39.0–52.0)
Hemoglobin: 15.1 g/dL (ref 13.0–17.0)
MCH: 31.5 pg (ref 26.0–34.0)
MCHC: 33.5 g/dL (ref 30.0–36.0)
MCV: 94 fL (ref 80.0–100.0)
Platelets: 348 10*3/uL (ref 150–400)
RBC: 4.8 MIL/uL (ref 4.22–5.81)
RDW: 13.7 % (ref 11.5–15.5)
WBC: 10 10*3/uL (ref 4.0–10.5)
nRBC: 0 % (ref 0.0–0.2)

## 2020-02-05 LAB — LIPASE, BLOOD: Lipase: 32 U/L (ref 11–51)

## 2020-02-05 MED ORDER — MORPHINE SULFATE (PF) 4 MG/ML IV SOLN
4.0000 mg | Freq: Once | INTRAVENOUS | Status: AC
  Administered 2020-02-05: 4 mg via INTRAVENOUS
  Filled 2020-02-05: qty 1

## 2020-02-05 MED ORDER — IOHEXOL 300 MG/ML  SOLN
100.0000 mL | Freq: Once | INTRAMUSCULAR | Status: AC | PRN
  Administered 2020-02-05: 100 mL via INTRAVENOUS

## 2020-02-05 MED ORDER — HALOPERIDOL LACTATE 5 MG/ML IJ SOLN
5.0000 mg | Freq: Once | INTRAMUSCULAR | Status: AC
  Administered 2020-02-05: 5 mg via INTRAVENOUS
  Filled 2020-02-05: qty 1

## 2020-02-05 MED ORDER — LORAZEPAM 2 MG/ML IJ SOLN
1.0000 mg | Freq: Once | INTRAMUSCULAR | Status: AC
  Administered 2020-02-05: 1 mg via INTRAVENOUS
  Filled 2020-02-05: qty 1

## 2020-02-05 MED ORDER — SODIUM CHLORIDE (PF) 0.9 % IJ SOLN
INTRAMUSCULAR | Status: AC
  Filled 2020-02-05: qty 50

## 2020-02-05 MED ORDER — SUCRALFATE 1 GM/10ML PO SUSP
1.0000 g | Freq: Three times a day (TID) | ORAL | 0 refills | Status: DC
Start: 2020-02-05 — End: 2020-08-28

## 2020-02-05 MED ORDER — METOCLOPRAMIDE HCL 5 MG/ML IJ SOLN
10.0000 mg | Freq: Once | INTRAMUSCULAR | Status: AC
  Administered 2020-02-05: 10 mg via INTRAVENOUS
  Filled 2020-02-05: qty 2

## 2020-02-05 MED ORDER — FAMOTIDINE IN NACL 20-0.9 MG/50ML-% IV SOLN
20.0000 mg | Freq: Once | INTRAVENOUS | Status: AC
  Administered 2020-02-05: 20 mg via INTRAVENOUS
  Filled 2020-02-05: qty 50

## 2020-02-05 MED ORDER — KETOROLAC TROMETHAMINE 30 MG/ML IJ SOLN: 30.0000 mg | Freq: Once | INTRAMUSCULAR | Status: DC

## 2020-02-05 MED ORDER — SODIUM CHLORIDE 0.9 % IV BOLUS
1000.0000 mL | Freq: Once | INTRAVENOUS | Status: AC
  Administered 2020-02-05: 1000 mL via INTRAVENOUS

## 2020-02-05 NOTE — ED Notes (Signed)
Emesis x 1 and patient is reporting severe pain. Provider aware.

## 2020-02-05 NOTE — Discharge Instructions (Addendum)
You were seen in the ER for abdominal pain. Labs were normal. CT shows inflammation of esophagus.   I suspect your symptoms may be from gastritis, acid reflux or possibly ulcer.  Could have also been a virus. All of these are managed similarly.   We will treat this with anti-acid medicines.  Start taking over the counter omeprazole to 40 mg daily, take on an empty stomach first thing in the morning and wait 20-30 min before eating.  Add famotidine 20 mg in the AM and PM.  Use sucralfate solution 20 min before meals and at bed time. Take tylenol for pain.   Avoid irritating foods and liquids such as alcohol, greasy/fatty or acidic foods. Avoid ibuprofen containing products.   Follow up with primary care doctor for further management of this if it persits.   Return to the ER for fever, chills, blood in vomit or stool, worsening localized abdominal pain to right upper or right lower abdomen, inability to tolerate fluids.  Follow-up with your doctor in 1 week if your symptoms have not improved or resolved, you may need to be seen by a GI doctor again

## 2020-02-05 NOTE — ED Triage Notes (Addendum)
Lower abdominal pain with n/v/d x 4 days. Sts he had a seizure pta. fentanyl given by EMS.

## 2020-02-05 NOTE — ED Provider Notes (Signed)
[facility] DEPT Provider Note   CSN: [id number] Arrival date & time: 02/05/20  7169     History Chief Complaint  Patient presents with  . Abdominal Pain    Greg Morales is a 34 y.o. male with history of seizures on Keppra, tobacco dependence, marijuana use, acid reflux and chronic abdominal pain brought to the ER by EMS for evaluation of sudden onset, constant, periumbilical abdominal pain onset 3 days ago.  Associated with nonbilious, nonbloody emesis, abdominal pain, nonbloody, nonmelanotic diarrhea, decreased appetite and decreased food and fluid intake.  Intermittent hot chills throughout the day.  Currently reports mild nausea, moderate abdominal pain.  Abdominal pain is constant, sharp, around the umbilicus.  Unable to keep food down due to vomiting for the past 4 days, is able to keep fluids down.  Three episodes of emesis since symptom onset. Reports being very thirsty.  Admits having similar abdominal pain in the past 4 years.  Cannot tell me how the pain is different today.  Reports history of acid reflux that feels like it has been flaring up lately.  No interventions prior to arrival.  Takes Keppra 500 mg 3 times daily, last dose last night but admits occasional missed doses.  Reports had unwitnessed seizure at home prior to EMS arrival, but states seizure and prodrome appears similar to his usual seizures.  Denies illicit drug, alcohol or NSAID use. No history of GI bleed.  Denies hematemesis, melena, blood per rectum.  No sick contacts.  No abdominal surgeries.  No intake of suspicious foods.  No urinary symptoms.  Denies Covid symptoms like cough, fever, chest pain, shortness of breath.    HPI     Past Medical History:  Diagnosis Date  . Chronic abdominal pain   . Medically noncompliant   . Polysubstance abuse ([place])   . Seizures ([place])    since childhood    Patient Active Problem List   Diagnosis Date Noted  . Lung nodule < 6cm on CT  07/27/2019  . Grand mal seizure ([place]) 05/24/2018  . Tobacco dependence 05/24/2018  . History of bipolar disorder 05/24/2018  . Elevated blood lead level 05/24/2018    Past Surgical History:  Procedure Laterality Date  . ESOPHAGOGASTRODUODENOSCOPY         Family History  Problem Relation Age of Onset  . Hypertension Other   . Seizures Other   . Seizures Maternal Grandmother     Social History   Tobacco Use  . Smoking status: Current Every Day Smoker    Packs/day: 1.00    Years: 5.00    Pack years: 5.00    Types: Cigarettes  . Smokeless tobacco: Never Used  Substance Use Topics  . Alcohol use: Not Currently  . Drug use: Not Currently    Types: Marijuana    Home Medications Prior to Admission medications   Medication Sig Start Date End Date Taking? Authorizing Provider  dicyclomine (BENTYL) 20 MG tablet Take 1 tablet (20 mg total) by mouth 2 (two) times daily. Patient not taking: Reported on 02/05/2020 06/15/19   Tacy Learn, PA-C  lamoTRIgine (LAMICTAL) 100 MG tablet Take 1 tablet (100 mg total) by mouth daily. Take 3 tablets by mouth in the evening with meals Patient not taking: Reported on 02/05/2020 05/24/18   Ladell Pier, MD  levETIRAcetam (KEPPRA) 500 MG tablet Take 2 tabs PO Q a.m and 1 tab Po Q p.m Patient not taking: Reported on 02/05/2020 07/31/19   Wynetta Emery,  Binnie Rail, MD  mirtazapine (REMERON) 15 MG tablet Take 1 tablet (15 mg total) by mouth at bedtime. Take 1/2 tablet by mouth at bedtime Patient not taking: Reported on 02/05/2020 05/24/18   [name], MD  ondansetron (ZOFRAN ODT) 4 MG disintegrating tablet Take 1 tablet (4 mg total) by mouth every 8 (eight) hours as needed for nausea or vomiting. Patient not taking: Reported on 02/05/2020 06/20/19   Anders Simmonds, PA-C  pantoprazole (PROTONIX) 40 MG tablet Take 1 tablet (40 mg total) by mouth daily. Patient not taking: Reported on 02/05/2020 06/20/19 07/20/19  Anders Simmonds, PA-C   sucralfate (CARAFATE) 1 GM/10ML suspension Take 10 mLs (1 g total) by mouth 4 (four) times daily -  with meals and at bedtime. 02/05/20   Liberty Handy, PA-C    Allergies    Patient has no known allergies.  Review of Systems   Review of Systems  Constitutional: Positive for appetite change and chills.  Gastrointestinal: Positive for abdominal pain, diarrhea, nausea and vomiting.  Neurological: Positive for seizures.  All other systems reviewed and are negative.   Physical Exam Updated Vital Signs BP 123/85   Pulse 81   Temp 98 F (36.7 C) (Oral)   Resp 16   Ht 5\' 5"  (1.651 m)   Wt 89.8 kg   SpO2 97%   BMI 32.95 kg/m   Physical Exam Vitals and nursing note reviewed.  Constitutional:      Appearance: He is well-developed.     Comments: Non toxic, asleep in room but easily arousable.  HENT:     Head: Normocephalic and atraumatic.     Nose: Nose normal.     Mouth/Throat:     Comments: Moist mucous membranes.  No tongue or intraoral injury. Eyes:     Conjunctiva/sclera: Conjunctivae normal.  Cardiovascular:     Rate and Rhythm: Normal rate and regular rhythm.     Heart sounds: Normal heart sounds.  Pulmonary:     Effort: Pulmonary effort is normal.     Breath sounds: Normal breath sounds.  Abdominal:     General: Bowel sounds are normal.     Palpations: Abdomen is soft.     Tenderness: There is generalized abdominal tenderness.     Comments: Diffuse abdominal tenderness, significant in RUQ, epigastrium, left lateral abdomen.  No guarding, rigidity.  No distention.  No suprapubic or CVA tenderness.  Negative Murphy's and McBurney's.  Active bowel sounds throughout.  Small, nontender, reducible umbilical hernia.  Musculoskeletal:        General: Normal range of motion.     Cervical back: Normal range of motion.  Skin:    General: Skin is warm and dry.     Capillary Refill: Capillary refill takes less than 2 seconds.  Neurological:     Mental Status: He is  alert.     Comments: Speech is fluent, clear normal.  Grossly normal, coordinated and equal movements of upper and lower extremity.  Ambulatory in room without ataxia.  Psychiatric:        Behavior: Behavior normal.     ED Results / Procedures / Treatments   Labs (all labs ordered are listed, but only abnormal results are displayed) Labs Reviewed  COMPREHENSIVE METABOLIC PANEL - Abnormal; Notable for the following components:      Result Value   Potassium 3.2 (*)    Glucose, Bld 126 (*)    All other components within normal limits  LIPASE, BLOOD  CBC  URINALYSIS, ROUTINE W REFLEX MICROSCOPIC    EKG EKG Interpretation  Date/Time:  Monday Feb 05 2020 08:28:03 EDT Ventricular Rate:  98 PR Interval:    QRS Duration: 81 QT Interval:  352 QTC Calculation: 450 R Axis:   67 Text Interpretation: Sinus rhythm Borderline low voltage, extremity leads Confirmed by Geoffery Lyons ([id number]) on 02/05/2020 10:35:04 AM   Radiology CT ABDOMEN PELVIS W CONTRAST  Result Date: 02/05/2020 CLINICAL DATA:  Epigastric pain, vomiting and diarrhea. EXAM: CT ABDOMEN AND PELVIS WITH CONTRAST TECHNIQUE: Multidetector CT imaging of the abdomen and pelvis was performed using the standard protocol following bolus administration of intravenous contrast. CONTRAST:  100 mL OMNIPAQUE IOHEXOL 300 MG/ML  SOLN COMPARISON:  CT abdomen and pelvis 06/13/2019. FINDINGS: Lower chest: Mild dependent atelectasis in the lung bases. No pleural or pericardial effusion. Walls of the distal esophagus are thickened, unchanged. Hepatobiliary: No focal liver abnormality is seen. No gallstones, gallbladder wall thickening, or biliary dilatation. Pancreas: Unremarkable. No pancreatic ductal dilatation or surrounding inflammatory changes. Spleen: Normal in size without focal abnormality. Adrenals/Urinary Tract: Adrenal glands are unremarkable. Kidneys are normal, without renal calculi, focal lesion, or hydronephrosis. Bladder is  unremarkable. Stomach/Bowel: Stomach is within normal limits. Appendix appears normal. No evidence of bowel wall thickening, distention, or inflammatory changes. Vascular/Lymphatic: No significant vascular findings are present. No enlarged abdominal or pelvic lymph nodes. Reproductive: Prostate is unremarkable. Other: None. Musculoskeletal: Negative. IMPRESSION: Thickening of the walls of the distal esophagus may be due to inflammatory change. The patient's CT scan is otherwise negative. Electronically Signed   By: Drusilla Kanner M.D.   On: 02/05/2020 11:43    Procedures Procedures (including critical care time)  Medications Ordered in ED Medications  sodium chloride (PF) 0.9 % injection (has no administration in time range)  metoCLOPramide (REGLAN) injection 10 mg (10 mg Intravenous Given 02/05/20 0739)  sodium chloride 0.9 % bolus 1,000 mL (0 mLs Intravenous Stopped 02/05/20 1107)  famotidine (PEPCID) IVPB 20 mg premix (0 mg Intravenous Stopped 02/05/20 0900)  haloperidol lactate (HALDOL) injection 5 mg (5 mg Intravenous Given 02/05/20 1015)  LORazepam (ATIVAN) injection 1 mg (1 mg Intravenous Given 02/05/20 1105)  morphine 4 MG/ML injection 4 mg (4 mg Intravenous Given 02/05/20 1105)  iohexol (OMNIPAQUE) 300 MG/ML solution 100 mL (100 mLs Intravenous Contrast Given 02/05/20 1119)    ED Course  I have reviewed the triage vital signs and the nursing notes.  Pertinent labs & imaging results that were available during my care of the patient were reviewed by me and considered in my medical decision making (see chart for details).  Clinical Course as of Feb 04 1258  Mon Feb 05, 2020  5852 Re-evaluated patient, complains of severe abdominal pain. He is in fetal position in bed dry heaving, but no emesis.  Have ordered haldol but RN unable to administer due to code patient next door.     [CG]  1012 Asked other RN to give haldol. Primary nurse still in code.    [CG]  1055 Re-evaluated patient after  haldol, reports mild transient improvement but pain now back, severe. In fetal position in bed, screaming in pain.  Family at bedside. Repeat abd exam with diffuse epigastrium/left sided abdominal pain.  No right abd tenderness.  Discussed plan to give ativan/morphine, reassess pain. Low threshold for CT now.  Pt has had 3 CT in last 6 months, h/o chronic abd pain.    [CG]  1247 Reevaluated patient.  Appears to be much more  comfortable, asleep.  Family bedside state this is the first time he has been able to rest.  No longer having emesis.  Repeat abdominal exam improved.  Discussed CT findings showing esophagitis.  Discussed plan for p.o. challenge and discharge.  Patient and family in agreement.   [CG]    Clinical Course User Index [CG] Jerrell Mylar   MDM Rules/Calculators/A&P                      34 year old male with nausea, vomiting, diarrhea, abdominal pain.  History of same in the past for several years.  Several ER visits for same.  He has had 3 CTs in the last 6 months.  Previously positive THC in UDS. No fever. No COVID symptoms. No cardiac symptoms/CP.   Appears to be in only mild discomfort initially, will obtain labs, UA and provide IV fluid, antiemetics and PPI.  Will reassess.  Your work-up personally reviewed and interpreted, labs benign.  EMR reviewed to obtain PMH and assist with MDM. Obtained further information from family member at bedside.   1250: Patient reevaluated several times, reported worsening pain while in the ER.  Given Haldol which only helped for a few minutes but patient complained of worsening pain.  CT A/P was obtained today given his refractory pain, this shows esophageal irritation but otherwise nonacute.  Ativan and morphine with good symptom control.  Patient now tolerating p.o.  Given significant improvement in the ER, benign work-up and imaging along with chronicity of symptoms, suspect GI etiology like GERD, PUD or cannabinoid hyperemesis  syndrome, possibly viral gastroenteritis.  Discussed plan to discharge with PPI, Phenergan, carafate, GI follow-up.  Family at bedside state patient has been seen for similar symptoms by PCP who referred to GI.  Patient thinks he had an EGD but unsure.  Encouraged importance of marijuana, alcohol, NSAID cessation and healthy diet.  Return precautions given.  Patient and family in agreement with plan.  Final Clinical Impression(s) / ED Diagnoses Final diagnoses:  Nausea vomiting and diarrhea  Abdominal pain, epigastric    Rx / DC Orders ED Discharge Orders         Ordered    sucralfate (CARAFATE) 1 GM/10ML suspension  3 times daily with meals & bedtime     02/05/20 1254           Liberty Handy, PA-C 02/05/20 1259    Virgina Norfolk, DO 02/05/20 1335

## 2020-02-07 ENCOUNTER — Emergency Department (HOSPITAL_COMMUNITY)
Admission: EM | Admit: 2020-02-07 | Discharge: 2020-02-07 | Disposition: A | Discharge status: Home / Self Care | Payer: Self-pay | Attending: Emergency Medicine | Admitting: Emergency Medicine

## 2020-02-07 ENCOUNTER — Other Ambulatory Visit: Payer: Self-pay

## 2020-02-07 DIAGNOSIS — R109 Unspecified abdominal pain: Secondary | ICD-10-CM

## 2020-02-07 DIAGNOSIS — R1012 Left upper quadrant pain: Secondary | ICD-10-CM | POA: Insufficient documentation

## 2020-02-07 DIAGNOSIS — F17210 Nicotine dependence, cigarettes, uncomplicated: Secondary | ICD-10-CM | POA: Insufficient documentation

## 2020-02-07 DIAGNOSIS — R112 Nausea with vomiting, unspecified: Secondary | ICD-10-CM | POA: Insufficient documentation

## 2020-02-07 DIAGNOSIS — Z79899 Other long term (current) drug therapy: Secondary | ICD-10-CM | POA: Insufficient documentation

## 2020-02-07 LAB — COMPREHENSIVE METABOLIC PANEL
ALT: 24 U/L (ref 0–44)
AST: 25 U/L (ref 15–41)
Albumin: 3.9 g/dL (ref 3.5–5.0)
Alkaline Phosphatase: 48 U/L (ref 38–126)
Anion gap: 13 (ref 5–15)
BUN: 6 mg/dL (ref 6–20)
CO2: 23 mmol/L (ref 22–32)
Calcium: 9.2 mg/dL (ref 8.9–10.3)
Chloride: 106 mmol/L (ref 98–111)
Creatinine, Ser: 1.22 mg/dL (ref 0.61–1.24)
GFR calc Af Amer: >60 mL/min (ref >60)
GFR calc non Af Amer: >60 mL/min (ref >60)
Glucose, Bld: 122 mg/dL — ABNORMAL HIGH (ref 70–99)
Potassium: 3.2 mmol/L — ABNORMAL LOW (ref 3.5–5.1)
Sodium: 142 mmol/L (ref 135–145)
Total Bilirubin: 1.2 mg/dL (ref 0.3–1.2)
Total Protein: 7 g/dL (ref 6.5–8.1)

## 2020-02-07 LAB — CBC
HCT: 41.7 % (ref 39.0–52.0)
Hemoglobin: 14.2 g/dL (ref 13.0–17.0)
MCH: 31.3 pg (ref 26.0–34.0)
MCHC: 34.1 g/dL (ref 30.0–36.0)
MCV: 91.9 fL (ref 80.0–100.0)
Platelets: 363 10*3/uL (ref 150–400)
RBC: 4.54 MIL/uL (ref 4.22–5.81)
RDW: 13.6 % (ref 11.5–15.5)
WBC: 13.1 10*3/uL — ABNORMAL HIGH (ref 4.0–10.5)
nRBC: 0 % (ref 0.0–0.2)

## 2020-02-07 LAB — RAPID URINE DRUG SCREEN, HOSP PERFORMED
Amphetamines: NOT DETECTED
Barbiturates: NOT DETECTED
Benzodiazepines: NOT DETECTED
Cocaine: NOT DETECTED
Opiates: NOT DETECTED
Tetrahydrocannabinol: POSITIVE — AB

## 2020-02-07 LAB — URINALYSIS, ROUTINE W REFLEX MICROSCOPIC
Bilirubin Urine: NEGATIVE
Glucose, UA: NEGATIVE mg/dL
Hgb urine dipstick: NEGATIVE
Ketones, ur: 20 mg/dL — AB
Leukocytes,Ua: NEGATIVE
Nitrite: NEGATIVE
Protein, ur: NEGATIVE mg/dL
Specific Gravity, Urine: 1.023 (ref 1.005–1.030)
pH: 7 (ref 5.0–8.0)

## 2020-02-07 LAB — LIPASE, BLOOD: Lipase: 20 U/L (ref 11–51)

## 2020-02-07 MED ORDER — ONDANSETRON HCL 4 MG/2ML IJ SOLN
4.0000 mg | Freq: Once | INTRAMUSCULAR | Status: AC
  Administered 2020-02-07: 4 mg via INTRAVENOUS
  Filled 2020-02-07: qty 2

## 2020-02-07 MED ORDER — PANTOPRAZOLE SODIUM 40 MG IV SOLR
40.0000 mg | Freq: Once | INTRAVENOUS | Status: AC
  Administered 2020-02-07: 40 mg via INTRAVENOUS
  Filled 2020-02-07: qty 40

## 2020-02-07 MED ORDER — DICYCLOMINE HCL 20 MG PO TABS
20.0000 mg | ORAL_TABLET | Freq: Two times a day (BID) | ORAL | 0 refills | Status: DC
Start: 2020-02-07 — End: 2020-08-27

## 2020-02-07 MED ORDER — PROMETHAZINE HCL 25 MG PO TABS
25.0000 mg | ORAL_TABLET | Freq: Four times a day (QID) | ORAL | 0 refills | Status: DC | PRN
Start: 2020-02-07 — End: 2020-08-27

## 2020-02-07 MED ORDER — HALOPERIDOL LACTATE 5 MG/ML IJ SOLN
5.0000 mg | Freq: Once | INTRAMUSCULAR | Status: AC
  Administered 2020-02-07: 5 mg via INTRAVENOUS
  Filled 2020-02-07: qty 1

## 2020-02-07 MED ORDER — SODIUM CHLORIDE 0.9% FLUSH
3.0000 mL | Freq: Once | INTRAVENOUS | Status: AC
  Administered 2020-02-07: 3 mL via INTRAVENOUS

## 2020-02-07 MED ORDER — PANTOPRAZOLE SODIUM 20 MG PO TBEC
20.0000 mg | DELAYED_RELEASE_TABLET | Freq: Every day | ORAL | 0 refills | Status: DC
Start: 2020-02-07 — End: 2020-08-27

## 2020-02-07 MED ORDER — LEVETIRACETAM IN NACL 1000 MG/100ML IV SOLN
1000.0000 mg | Freq: Once | INTRAVENOUS | Status: AC
  Administered 2020-02-07: 1000 mg via INTRAVENOUS
  Filled 2020-02-07: qty 100

## 2020-02-07 MED ORDER — POTASSIUM CHLORIDE CRYS ER 20 MEQ PO TBCR
40.0000 meq | EXTENDED_RELEASE_TABLET | Freq: Once | ORAL | Status: AC
  Administered 2020-02-07: 40 meq via ORAL
  Filled 2020-02-07: qty 2

## 2020-02-07 MED ORDER — DICYCLOMINE HCL 10 MG/ML IM SOLN
20.0000 mg | Freq: Once | INTRAMUSCULAR | Status: AC
  Administered 2020-02-07: 20 mg via INTRAMUSCULAR
  Filled 2020-02-07: qty 2

## 2020-02-07 MED ORDER — SODIUM CHLORIDE 0.9 % IV BOLUS (SEPSIS)
1000.0000 mL | Freq: Once | INTRAVENOUS | Status: AC
  Administered 2020-02-07: 1000 mL via INTRAVENOUS

## 2020-02-07 MED ORDER — ONDANSETRON 4 MG PO TBDP
4.0000 mg | ORAL_TABLET | Freq: Once | ORAL | Status: DC | PRN
  Filled 2020-02-07: qty 1

## 2020-02-07 MED ORDER — MORPHINE SULFATE (PF) 4 MG/ML IV SOLN
4.0000 mg | Freq: Once | INTRAVENOUS | Status: AC
  Administered 2020-02-07: 4 mg via INTRAVENOUS
  Filled 2020-02-07: qty 1

## 2020-02-07 MED ORDER — SODIUM CHLORIDE 0.9 % IV BOLUS
1000.0000 mL | Freq: Once | INTRAVENOUS | Status: AC
  Administered 2020-02-07: 1000 mL via INTRAVENOUS

## 2020-02-07 MED ORDER — KETOROLAC TROMETHAMINE 30 MG/ML IJ SOLN
30.0000 mg | Freq: Once | INTRAMUSCULAR | Status: AC
  Administered 2020-02-07: 30 mg via INTRAVENOUS
  Filled 2020-02-07: qty 1

## 2020-02-07 MED ORDER — CAPSAICIN 0.025 % EX CREA
TOPICAL_CREAM | Freq: Once | CUTANEOUS | Status: AC
  Filled 2020-02-07: qty 60

## 2020-02-07 NOTE — ED Provider Notes (Signed)
Medical screening examination/treatment/procedure(s) were conducted as a shared visit with non-physician practitioner(s) and myself.  I personally evaluated the patient during the encounter.  EKG Interpretation  Date/Time:  Wednesday Feb 07 2020 06:55:09 EDT Ventricular Rate:  86 PR Interval:    QRS Duration: 84 QT Interval:  379 QTC Calculation: 454 R Axis:   68 Text Interpretation: Sinus rhythm Borderline low voltage, extremity leads No significant change since last tracing Confirmed by Lorre Nick ([id number]) on 02/07/2020 9:80:18 AM  34 year old male here with abdominal discomfort.  Recent ED visit for same.  Labs in exam are reassuring.  Stable for discharge   Lorre Nick, MD 02/07/20 1111

## 2020-02-07 NOTE — ED Provider Notes (Signed)
[facility]-EMERGENCY DEPT Provider Note   CSN: [id number] Arrival date & time: 02/07/20  0424     History Chief Complaint  Patient presents with  . Nausea  . Abdominal Pain    Greg Morales is a 34 y.o. male.  HPI   34 year old male with a history of chronic abdominal pain, polysubstance abuse, seizures, who presents to the emergency department today complaining of abdominal pain that started 3 days ago.  Pain located periumbilically and to the left upper part of the abdomen.  Is constant in nature and is severe.  It is associated with nausea, vomiting and diarrhea.  He is not sure if he has had any fevers.  He denies any urinary symptoms.  Per EMS report patient has 2 seizures in the last 3 hours.  He did not have a fall or head injury.  His symptoms of seizure activity included closing his eyes and not responding however when EMS evaluated him he was able to open his eyes and speak during episode.  Vital signs were reassuring in route.  Past Medical History:  Diagnosis Date  . Chronic abdominal pain   . Medically noncompliant   . Polysubstance abuse ([facility])   . Seizures ([facility])    since childhood    Patient Active Problem List   Diagnosis Date Noted  . Lung nodule < 6cm on CT 07/27/2019  . 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 Tobacco dependence 05/24/2018  . History of bipolar disorder 05/24/2018  . Elevated blood lead level 05/24/2018    Past Surgical History:  Procedure Laterality Date  . ESOPHAGOGASTRODUODENOSCOPY         Family History  Problem Relation Age of Onset  . Hypertension Other   . Seizures Other   . Seizures Maternal Grandmother     Social History   Tobacco Use  . Smoking status: Current Every Day Smoker    Packs/day: 1.00    Years: 5.00    Pack years: 5.00    Types: Cigarettes  . Smokeless tobacco: Never Used  Substance Use Topics  . Alcohol use: Not Currently  . Drug use: Not Currently    Types: Marijuana     Home Medications Prior to Admission medications   Medication Sig Start Date End Date Taking? Authorizing Provider  dicyclomine (BENTYL) 20 MG tablet Take 1 tablet (20 mg total) by mouth 2 (two) times daily. 02/07/20   Yanissa Michalsky S, PA-C  lamoTRIgine (LAMICTAL) 100 MG tablet Take 1 tablet (100 mg total) by mouth daily. Take 3 tablets by mouth in the evening with meals Patient not taking: Reported on 02/05/2020 05/24/18   [name], MD  levETIRAcetam (KEPPRA) 500 MG tablet Take 2 tabs PO Q a.m and 1 tab Po Q p.m Patient not taking: Reported on 02/05/2020 07/31/19   [name], MD  mirtazapine (REMERON) 15 MG tablet Take 1 tablet (15 mg total) by mouth at bedtime. Take 1/2 tablet by mouth at bedtime Patient not taking: Reported on 02/05/2020 05/24/18   [name], MD  ondansetron (ZOFRAN ODT) 4 MG disintegrating tablet Take 1 tablet (4 mg total) by mouth every 8 (eight) hours as needed for nausea or vomiting. Patient not taking: Reported on 02/05/2020 06/20/19   Anders Simmonds, PA-C  pantoprazole (PROTONIX) 20 MG tablet Take 1 tablet (20 mg total) by mouth daily for 14 days. 02/07/20 02/21/20  Oskar Cretella S, PA-C  promethazine (PHENERGAN) 25 MG tablet Take 1 tablet (25  mg total) by mouth every 6 (six) hours as needed for nausea or vomiting. 02/07/20   Tran Arzuaga S, PA-C  sucralfate (CARAFATE) 1 GM/10ML suspension Take 10 mLs (1 g total) by mouth 4 (four) times daily -  with meals and at bedtime. 02/05/20   Liberty Handy, PA-C    Allergies    Patient has no known allergies.  Review of Systems   Review of Systems  Constitutional: Negative for fever.  HENT: Negative for ear pain and sore throat.   Eyes: Negative for visual disturbance.  Respiratory: Negative for cough and shortness of breath.   Cardiovascular: Negative for chest pain.  Gastrointestinal: Positive for diarrhea, nausea and vomiting. Negative for abdominal pain and constipation.   Genitourinary: Negative for dysuria and hematuria.  Musculoskeletal: Negative for back pain.  Skin: Negative for rash.  Neurological: Positive for seizures. Negative for syncope.  All other systems reviewed and are negative.   Physical Exam Updated Vital Signs BP 111/86 (BP Location: Left Arm)   Pulse 77   Temp (!) 97.5 F (36.4 C) (Oral)   Resp 18   Ht 5\' 5"  (1.651 m)   Wt 89.8 kg   SpO2 100%   BMI 32.95 kg/m   Physical Exam Vitals and nursing note reviewed.  Constitutional:      Appearance: He is well-developed.  HENT:     Head: Normocephalic and atraumatic.  Eyes:     Conjunctiva/sclera: Conjunctivae normal.  Cardiovascular:     Rate and Rhythm: Normal rate and regular rhythm.     Heart sounds: Normal heart sounds. No murmur.  Pulmonary:     Effort: Pulmonary effort is normal. No respiratory distress.     Breath sounds: Normal breath sounds. No wheezing, rhonchi or rales.  Abdominal:     Palpations: Abdomen is soft.     Tenderness: There is abdominal tenderness in the periumbilical area and left upper quadrant. There is no guarding or rebound.     Comments: Actively vomiting on exam  Musculoskeletal:     Cervical back: Neck supple.  Skin:    General: Skin is warm and dry.  Neurological:     Mental Status: He is alert.     ED Results / Procedures / Treatments   Labs (all labs ordered are listed, but only abnormal results are displayed) Labs Reviewed  CBC - Abnormal; Notable for the following components:      Result Value   WBC 13.1 (*)    All other components within normal limits  URINALYSIS, ROUTINE W REFLEX MICROSCOPIC - Abnormal; Notable for the following components:   Ketones, ur 20 (*)    All other components within normal limits  RAPID URINE DRUG SCREEN, HOSP PERFORMED - Abnormal; Notable for the following components:   Tetrahydrocannabinol POSITIVE (*)    All other components within normal limits  COMPREHENSIVE METABOLIC PANEL - Abnormal;  Notable for the following components:   Potassium 3.2 (*)    Glucose, Bld 122 (*)    All other components within normal limits  LIPASE, BLOOD    EKG EKG Interpretation  Date/Time:  Wednesday Feb 07 2020 06:55:09 EDT Ventricular Rate:  86 PR Interval:    QRS Duration: 84 QT Interval:  379 QTC Calculation: 454 R Axis:   68 Text Interpretation: Sinus rhythm Borderline low voltage, extremity leads No significant change since last tracing Confirmed by [birth year] (Lorre Nick) on 02/07/2020 9:16:19 AM   Radiology CT ABDOMEN PELVIS W CONTRAST  Result Date: 02/05/2020  CLINICAL DATA:  Epigastric pain, vomiting and diarrhea. EXAM: CT ABDOMEN AND PELVIS WITH CONTRAST TECHNIQUE: Multidetector CT imaging of the abdomen and pelvis was performed using the standard protocol following bolus administration of intravenous contrast. CONTRAST:  100 mL OMNIPAQUE IOHEXOL 300 MG/ML  SOLN COMPARISON:  CT abdomen and pelvis 06/13/2019. FINDINGS: Lower chest: Mild dependent atelectasis in the lung bases. No pleural or pericardial effusion. Walls of the distal esophagus are thickened, unchanged. Hepatobiliary: No focal liver abnormality is seen. No gallstones, gallbladder wall thickening, or biliary dilatation. Pancreas: Unremarkable. No pancreatic ductal dilatation or surrounding inflammatory changes. Spleen: Normal in size without focal abnormality. Adrenals/Urinary Tract: Adrenal glands are unremarkable. Kidneys are normal, without renal calculi, focal lesion, or hydronephrosis. Bladder is unremarkable. Stomach/Bowel: Stomach is within normal limits. Appendix appears normal. No evidence of bowel wall thickening, distention, or inflammatory changes. Vascular/Lymphatic: No significant vascular findings are present. No enlarged abdominal or pelvic lymph nodes. Reproductive: Prostate is unremarkable. Other: None. Musculoskeletal: Negative. IMPRESSION: Thickening of the walls of the distal esophagus may be due to inflammatory  change. The patient's CT scan is otherwise negative. Electronically Signed   By: Inge Rise M.D.   On: 02/05/2020 11:43    Procedures Procedures (including critical care time)  Medications Ordered in ED Medications  sodium chloride flush (NS) 0.9 % injection 3 mL (3 mLs Intravenous Given 02/07/20 0538)  ondansetron (ZOFRAN) injection 4 mg (4 mg Intravenous Given 02/07/20 0538)  sodium chloride 0.9 % bolus 1,000 mL (0 mLs Intravenous Stopped 02/07/20 0826)  levETIRAcetam (KEPPRA) IVPB 1000 mg/100 mL premix (0 mg Intravenous Stopped 02/07/20 0556)  pantoprazole (PROTONIX) injection 40 mg (40 mg Intravenous Given 02/07/20 0538)  haloperidol lactate (HALDOL) injection 5 mg (5 mg Intravenous Given 02/07/20 0659)  dicyclomine (BENTYL) injection 20 mg (20 mg Intramuscular Given 02/07/20 0658)  capsaicin (ZOSTRIX) 0.025 % cream ( Topical Given 02/07/20 0730)  sodium chloride 0.9 % bolus 1,000 mL (1,000 mLs Intravenous New Bag/Given 02/07/20 0824)  ketorolac (TORADOL) 30 MG/ML injection 30 mg (30 mg Intravenous Given 02/07/20 0823)  potassium chloride SA (KLOR-CON) CR tablet 40 mEq (40 mEq Oral Given 02/07/20 0824)  morphine 4 MG/ML injection 4 mg (4 mg Intravenous Given 02/07/20 1019)  ondansetron (ZOFRAN) injection 4 mg (4 mg Intravenous Given 02/07/20 1018)    ED Course  I have reviewed the triage vital signs and the nursing notes.  Pertinent labs & imaging results that were available during my care of the patient were reviewed by me and considered in my medical decision making (see chart for details).    MDM Rules/Calculators/A&P                      34 y/o M with abd pain, NVD. VS reassuring. Afebrile, nontoxic appearing. Will get labs and order meds for symptomatic tx.  Reviewed/interpreted labs CBC with mild leukocytosis, no anemia CMP with mild hypokalemia, otherwise reassuring  - replaced in ed with po k Lipase wnl UA with ketonuria consistent with dehydration from recent nvd UDS +  for thc  Reviewed records per epic.  Patient with multiple similar episodes in the past.  He has been in the ED earlier this week and had very thorough work-up already including CT abd/pelvis from 02/05/2020 showed thickening of the walls of the distal esophagus may be due to inflammatory change. The patient's CT scan is otherwise negative.  Of note, UDS positive for marijuana.  Pt with improvement of pain after meds. He is  c/o some burning to his abd from the capsaicin cream. This was removed and he felt improved. Discussed results and plan for d/c with meds. Will give phenergan, ppi, and bentyl. Low suspicion for acute intraabdominal emergency and do not feel repeat imagining is necessary at this time. Pt and significant other at bedside are in agreement. pcp f/u give and return precautions discussed. All questions answered, pt stable for d/c.   Final Clinical Impression(s) / ED Diagnoses Final diagnoses:  Abdominal pain, unspecified abdominal location  Nausea and vomiting, intractability of vomiting not specified, unspecified vomiting type    Rx / DC Orders ED Discharge Orders         Ordered    promethazine (PHENERGAN) 25 MG tablet  Every 6 hours PRN     02/07/20 1111    dicyclomine (BENTYL) 20 MG tablet  2 times daily     02/07/20 1111    pantoprazole (PROTONIX) 20 MG tablet  Daily     02/07/20 1111           Cariana Karge, [place], PA-C 02/07/20 1115    Lorre Nick, MD 02/09/20 1110

## 2020-02-07 NOTE — ED Triage Notes (Signed)
Vomiting. Here Monday for same. 2 seizures in last 3 hours, approx 5 minutes. No fall, no head injury. Symptoms of seizure activity include closing his eyes and not responding. When ems opened lids patient spoke. HR 84 regular 100% on room air 140/86 CBG 154. Patient retching violently and loudly on arrival to ED.

## 2020-02-07 NOTE — Discharge Instructions (Signed)

## 2020-02-07 NOTE — ED Notes (Signed)
Told registration that patient's spouse could come back. Gave patient several hot packs to place on stomach and pain was reduced enough that patient has been resting. Cannot yet po challenge as patient is intermittently vomiting.

## 2020-02-07 NOTE — ED Provider Notes (Signed)
MSE was initiated and I personally evaluated the patient and placed orders (if any) at  6:03 AM on Feb 07, 2020.  The patient appears stable so that the remainder of the MSE may be completed by another provider.   Patient here for nausea and vomiting.  This is his third visit in the past 3 days.  Had a CT scan of the abdomen pelvis on 02/05/2020 that showed no acute abnormality.  Continues to abuse substances including marijuana.  Has also previously been cocaine positive.  Hemodynamically stable.  Labs, urine pending.  He is stable to be seen by oncoming provider.   Easter Kennebrew, Layla Maw, DO 02/07/20 [phone]

## 2020-02-07 NOTE — ED Notes (Signed)
Pt provided pitcher of water per fluid challenge order.

## 2020-02-14 MED ORDER — ONDANSETRON HCL 4 MG/2ML IJ SOLN
4.00 | INTRAMUSCULAR | Status: DC
Start: ? — End: 2020-02-14

## 2020-02-14 MED ORDER — PANTOPRAZOLE SODIUM 40 MG PO TBEC
40.00 | DELAYED_RELEASE_TABLET | ORAL | Status: DC
Start: 2020-02-12 — End: 2020-02-14

## 2020-02-14 MED ORDER — GENERIC EXTERNAL MEDICATION
Status: DC
Start: ? — End: 2020-02-14

## 2020-02-14 MED ORDER — GENERIC EXTERNAL MEDICATION
1500.00 | Status: DC
Start: 2020-02-12 — End: 2020-02-14

## 2020-02-14 MED ORDER — PROCHLORPERAZINE EDISYLATE 10 MG/2ML IJ SOLN
10.00 | INTRAMUSCULAR | Status: DC
Start: ? — End: 2020-02-14

## 2020-02-14 MED ORDER — ENOXAPARIN SODIUM 40 MG/0.4ML ~~LOC~~ SOLN
40.00 | SUBCUTANEOUS | Status: DC
Start: 2020-02-13 — End: 2020-02-14

## 2020-02-14 MED ORDER — FAMOTIDINE 20 MG/2ML IV SOLN
20.00 | INTRAVENOUS | Status: DC
Start: 2020-02-12 — End: 2020-02-14

## 2020-02-20 NOTE — Progress Notes (Signed)
Patient ID: Greg Morales, male   DOB: Jul 04, 1986, 34 y.o.   MRN: [id number]   Virtual Visit via Telephone Note  I connected with [name] on 02/21/20 at 11:10 AM EDT by telephone and verified that I am speaking with the correct person using two identifiers.   I discussed the limitations, risks, security and privacy concerns of performing an evaluation and management service by telephone and the availability of in person appointments. I also discussed with the patient that there may be a patient responsible charge related to this service. The patient expressed understanding and agreed to proceed.  PATIENT visit by telephone virtually in the context of Covid-19 pandemic. Patient location:  home My Location:  [facility] office Persons on the call:  Me and the patient   History of Present Illness: After hospitalization 5/19-5/22/2021 for N/V/abdominal pain.  Pain has resolved.  No further N/V.  No diarrhea.  No weakness.  Appetite is good.   From discharge summary: Admission Condition: poor Discharged Condition: fair  Hospital Course:  For full details, please see H&P, progress notes, consult notes and ancillary notes. Briefly, Greg Morales is a 34 y.o. male with a medical history significant for seizure disorder on Keppra, chronic abdominal pain, bipolar disorder, and a history of polysubstance use. He presented on 02/14/2020 with a chief complaint of N/V/abdominal pain. He was treated for this complaint. The details of his hospital stay are summarized below in a problem based format.   #Intractable Nausea and Vomiting Patient presented with 3-week history of intractable nausea and vomiting with inability to keep down solids and liquids. Of note he had recent admission and left AGAINST MEDICAL ADVICE. He has been seen in multiple hospitals for the same complaint. Patient was previously endorsing diarrhea as well. He was noted to have some hypokalemia on arrival, without other notable lab  abnormalities. Prior CT imaging at outside hospital showed some distal esophageal thickening, but CT abdomen pelvis obtained on arrival did not show any notable abnormality. Differential diagnosis includes gastritis, IBD, cyclic vomiting syndrome. Of note patient's UDS remains positive for THC. Gastroenterology team was consulted and they recommended management with antiemetics, with outpatient endoscopy. Patient was maintained on IV antiemetics as well as IV Pepcid, Protonix. His symptoms improved and on day of discharge he was tolerating full diet. Patient was counseled on using over-the-counter capsaicin cream to be applied to back of arm and upper back. Patient will follow up with PCP in the outpatient setting as scheduled. He will also be seeing GI at the beginning of July.  #Seizure Disorder on Keppra Patient has history of seizure disorder for which he takes Keppra 500 mg 3 times daily. Patient has been unable to tolerate his medications orally given his intractable nausea and vomiting and has missed several doses. Patient endorsed seizure prior to his presentation likely secondary to medication intolerance. Patient was managed with IV Keppra until nausea and vomiting resolved. He was transitioned to his oral medications and tolerated it without issue on day of discharge.  #Hypokalemia Upon presentation to the ED, patient was noted to be hypokalemic. This is likely secondary to GI loss. Patient received repletion with continuous infusion. At the time of discharge, his electrolytes have recovered to normal.  The patient's chronic medical conditions were treated accordingly per the patient's home medication regimen. The patient's medication reconciliation (with changes made to chronic medications), follow-up appointments, discharge orders, instructions and significant lab and diagnostic studies are noted below.   On the  morning of 5/22, the patient was deemed medically ready for discharge from the  hospital. The hospital course and treatment plan going forward were discussed with the patient and his family, and all questions were answered. The patient will be discharged home and was instructed for appropriate follow-up. He was hemodynamically stable and in no acute distress at the time of discharge.    Observations/Objective: NAD.  A&Ox3  Assessment and Plan: 1. Hypokalemia - Basic metabolic panel; Future  2. Nausea resolved  3. Encounter for examination following treatment at hospital   Follow Up Instructions: See PCP in 2-3 months   I discussed the assessment and treatment plan with the patient. The patient was provided an opportunity to ask questions and all were answered. The patient agreed with the plan and demonstrated an understanding of the instructions.   The patient was advised to call back or seek an in-person evaluation if the symptoms worsen or if the condition fails to improve as anticipated.  I provided 7 minutes of non-face-to-face time during this encounter.   Freeman Caldron, PA-C

## 2020-02-21 ENCOUNTER — Other Ambulatory Visit: Payer: Self-pay

## 2020-02-21 ENCOUNTER — Ambulatory Visit: Payer: Self-pay | Attending: Internal Medicine | Admitting: Physician Assistant

## 2020-02-21 DIAGNOSIS — E876 Hypokalemia: Secondary | ICD-10-CM

## 2020-02-21 DIAGNOSIS — R110 Nausea: Secondary | ICD-10-CM

## 2020-02-21 DIAGNOSIS — Z09 Encounter for follow-up examination after completed treatment for conditions other than malignant neoplasm: Secondary | ICD-10-CM

## 2020-04-22 ENCOUNTER — Other Ambulatory Visit: Payer: Self-pay

## 2020-04-22 ENCOUNTER — Ambulatory Visit: Payer: Self-pay | Attending: Internal Medicine | Admitting: Internal Medicine

## 2020-04-22 ENCOUNTER — Encounter: Payer: Self-pay | Admitting: Internal Medicine

## 2020-04-22 VITALS — BP 107/70 | HR 90 | Temp 99.0°F | Resp 16

## 2020-04-22 DIAGNOSIS — R109 Unspecified abdominal pain: Secondary | ICD-10-CM

## 2020-04-22 DIAGNOSIS — Z20822 Contact with and (suspected) exposure to covid-19: Secondary | ICD-10-CM

## 2020-04-22 DIAGNOSIS — R634 Abnormal weight loss: Secondary | ICD-10-CM

## 2020-04-22 DIAGNOSIS — R112 Nausea with vomiting, unspecified: Secondary | ICD-10-CM

## 2020-04-22 DIAGNOSIS — G8929 Other chronic pain: Secondary | ICD-10-CM

## 2020-04-22 NOTE — Progress Notes (Signed)
Patient ID: Greg Morales, male    DOB: 1986-05-11  MRN: [id number]  CC: covid symptoms   Subjective: Greg Morales is a 34 y.o. male who presents for ER f/u His concerns today include: medical history significant for seizure disorder on Keppra, tobacco dependence, chronic abdominal pain, bipolar disorder, and lung nodule.  Chronic abdominal pain  Patient presents today as follow-up from the ER at [facility] where he was seen earlier this month.  Patient reports he has been having generalized abdominal pain, vomiting, nausea and diarrhea for the past 2 to 3 months.  This has been associated with weight loss.  He tells me the only thing that he can hold down her fluids.  He has not had any chronic cough, no fever or night sweats.  No blood in the stools.  He reports loss of taste and smell.  He has been seen in the ER here locally and at [facility] several times since May with these complaints.  He had CAT scan of abdomen and pelvis with contrast done 02/05/2020 through the ER here locally.  This revealed thickening of the walls of the distal esophagus questionably due to inflammation but CAT scan otherwise negative.  He had another CAT scan on 02/14/2020 at [facility].  No abnormal findings observed.  On recent visit to the ER at [facility], urine drug screen was positive for marijuana and opiates.  He apparently was referred to gastroenterology at [facility] but missed both appointments.  Patient states that he had transportation issues.  His next appointment to see the gastroenterology is in October.  He feels he cannot wait that long. Patient Active Problem List   Diagnosis Date Noted  . Lung nodule < 6cm on CT 07/27/2019  . 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 Tobacco dependence 05/24/2018  . History of bipolar disorder 05/24/2018  . Elevated blood lead level 05/24/2018     Current Outpatient Medications on File Prior to Visit  Medication Sig Dispense Refill  .  levETIRAcetam (KEPPRA) 500 MG tablet Take 2 tabs PO Q a.m and 1 tab Po Q p.m 90 tablet 6  . dicyclomine (BENTYL) 20 MG tablet Take 1 tablet (20 mg total) by mouth 2 (two) times daily. (Patient not taking: Reported on 04/22/2020) 20 tablet 0  . lamoTRIgine (LAMICTAL) 100 MG tablet Take 1 tablet (100 mg total) by mouth daily. Take 3 tablets by mouth in the evening with meals (Patient not taking: Reported on 02/05/2020) 90 tablet 6  . mirtazapine (REMERON) 15 MG tablet Take 1 tablet (15 mg total) by mouth at bedtime. Take 1/2 tablet by mouth at bedtime (Patient not taking: Reported on 02/05/2020) 30 tablet 6  . ondansetron (ZOFRAN ODT) 4 MG disintegrating tablet Take 1 tablet (4 mg total) by mouth every 8 (eight) hours as needed for nausea or vomiting. (Patient not taking: Reported on 02/05/2020) 30 tablet 1  . pantoprazole (PROTONIX) 20 MG tablet Take 1 tablet (20 mg total) by mouth daily for 14 days. 14 tablet 0  . promethazine (PHENERGAN) 25 MG tablet Take 1 tablet (25 mg total) by mouth every 6 (six) hours as needed for nausea or vomiting. (Patient not taking: Reported on 04/22/2020) 30 tablet 0  . sucralfate (CARAFATE) 1 GM/10ML suspension Take 10 mLs (1 g total) by mouth 4 (four) times daily -  with meals and at bedtime. (Patient not taking: Reported on 04/22/2020) 420 mL 0   No current facility-administered medications  on file prior to visit.    No Known Allergies  Social History   Socioeconomic History  . Marital status: Married    Spouse name: Not on file  . Number of children: Not on file  . Years of education: Not on file  . Highest education level: Not on file  Occupational History  . Not on file  Tobacco Use  . Smoking status: Current Every Day Smoker    Packs/day: 1.00    Years: 5.00    Pack years: 5.00    Types: Cigarettes  . Smokeless tobacco: Never Used  Vaping Use  . Vaping Use: Never used  Substance and Sexual Activity  . Alcohol use: Not Currently  . Drug use: Not  Currently    Types: Marijuana  . Sexual activity: Not Currently  Other Topics Concern  . Not on file  Social History Narrative  . Not on file   Social Determinants of Health   Financial Resource Strain:   . Difficulty of Paying Living Expenses:   Food Insecurity:   . Worried About Programme researcher, broadcasting/film/video in the Last Year:   . Barista in the Last Year:   Transportation Needs:   . Freight forwarder (Medical):   Marland Kitchen Lack of Transportation (Non-Medical):   Physical Activity:   . Days of Exercise per Week:   . Minutes of Exercise per Session:   Stress:   . Feeling of Stress :   Social Connections:   . Frequency of Communication with Friends and Family:   . Frequency of Social Gatherings with Friends and Family:   . Attends Religious Services:   . Active Member of Clubs or Organizations:   . Attends Banker Meetings:   Marland Kitchen Marital Status:   Intimate Partner Violence:   . Fear of Current or Ex-Partner:   . Emotionally Abused:   Marland Kitchen Physically Abused:   . Sexually Abused:     Family History  Problem Relation Age of Onset  . Hypertension Other   . Seizures Other   . Seizures Maternal Grandmother     Past Surgical History:  Procedure Laterality Date  . ESOPHAGOGASTRODUODENOSCOPY      ROS: Review of Systems Negative except as stated above  PHYSICAL EXAM: BP 107/70   Pulse 90   Temp 99 F (37.2 C) (Oral)   Resp 16   SpO2 97%   Wt Readings from Last 3 Encounters:  02/07/20 198 lb (89.8 kg)  02/05/20 198 lb (89.8 kg)  11/03/19 191 lb (86.6 kg)    Physical Exam General appearance - alert, well appearing, and in no distress Mental status - normal mood, behavior, speech, dress, motor activity, and thought processes Mouth - mucous membranes moist, pharynx normal without lesions Neck - supple, no significant adenopathy Chest - clear to auscultation, no wheezes, rales or rhonchi, symmetric air entry Heart - normal rate, regular rhythm, normal S1,  S2, no murmurs, rubs, clicks or gallops Abdomen -normal bowel sounds.  Soft.  Mild diffuse tenderness without guarding or rebound.  No distention.  No organomegaly. Extremities -no lower extremity edema CMP Latest Ref Rng & Units 02/07/2020 02/05/2020 11/03/2019  Glucose 70 - 99 mg/dL 893(Y) 101(B) 510(C)  BUN 6 - 20 mg/dL 6 9 11   Creatinine 0.61 - 1.24 mg/dL 5.85 2.77)  Sodium 135 - 145 mmol/L 142 141 142  Potassium 3.5 - 5.1 mmol/L 3.2(L) 3.2(L) 4.1  Chloride 98 - 111 mmol/L 106 106 106  CO2 22 - 32 mmol/L 23 24 16(L)  Calcium 8.9 - 10.3 mg/dL 9.2 9.2 86.5  Total Protein 6.5 - 8.1 g/dL 7.0 7.2 -  Total Bilirubin 0.3 - 1.2 mg/dL 1.2 0.7 -  Alkaline Phos 38 - 126 U/L 48 53 -  AST 15 - 41 U/L 25 22 -  ALT 0 - 44 U/L 24 26 -   Lipid Panel  No results found for: CHOL, TRIG, HDL, CHOLHDL, VLDL, LDLCALC, LDLDIRECT  CBC    Component Value Date/Time   WBC 13.1 (H) 02/07/2020 0500   RBC 4.54 02/07/2020 0500   HGB 14.2 02/07/2020 0500   HGB 16.0 05/24/2018 1538   HCT 41.7 02/07/2020 0500   HCT 46.0 05/24/2018 1538   PLT 363 02/07/2020 0500   PLT 378 05/24/2018 1538   MCV 91.9 02/07/2020 0500   MCV 92 05/24/2018 1538   MCH 31.3 02/07/2020 0500   MCHC 34.1 02/07/2020 0500   RDW 13.6 02/07/2020 0500   RDW 14.6 05/24/2018 1538   LYMPHSABS 1.5 05/17/2019 1813   MONOABS 0.5 05/17/2019 1813   EOSABS 0.1 05/17/2019 1813   BASOSABS 0.1 05/17/2019 1813    ASSESSMENT AND PLAN: 1. Chronic abdominal pain 2. Intractable vomiting with nausea, unspecified vomiting type 3. Weight loss, unintentional -I recommend that we do some testing including HIV, TSH, screen for Celiac ds and stool cultures.  Patient is uninsured and does not have the [employer] card or [employer] discount.  I told him that I can refer him to the gastroenterologist here in [place] but without the orange card or cone discount, he would end up paying out-of-pocket.  I recommend Zofran as needed for nausea but patient declined  stating that nothing works for him. -Encouraged him to discontinue marijuana use as that too can sometimes cause cyclic vomiting. -At that point patient got up and stated that he is tired hearing the same thing and that he is leaving.  He declines to have any lab testing done.  4. Encounter for laboratory testing for COVID-19 virus - Novel Coronavirus, NAA ([facility])     Patient was given the opportunity to ask questions.  Patient verbalized understanding of the plan and was able to repeat key elements of the plan.   Orders Placed This Encounter  Procedures  . Novel Coronavirus, NAA ([facility])     Requested Prescriptions    No prescriptions requested or ordered in this encounter    No follow-ups on file.  Jonah Blue, MD, FACP

## 2020-04-23 LAB — NOVEL CORONAVIRUS, NAA: SARS-CoV-2, NAA: NOT DETECTED

## 2020-04-23 LAB — SARS-COV-2, NAA 2 DAY TAT

## 2020-05-28 ENCOUNTER — Ambulatory Visit: Payer: Self-pay | Attending: Internal Medicine | Admitting: Internal Medicine

## 2020-07-19 ENCOUNTER — Telehealth: Payer: Self-pay | Admitting: Internal Medicine

## 2020-07-19 NOTE — Telephone Encounter (Signed)
Copied from CRM [id number]. Topic: Quick Communication - See Telephone Encounter >> Jul 18, 2020 10:37 AM Aretta Nip wrote: CRM for notification. See Telephone encounter for: 07/18/20.pt wanting nurse cb as he says that he wants a referral to a GI dr, states he does not need to come in as states Dr knows all about situation. [phone]

## 2020-07-24 NOTE — Telephone Encounter (Signed)
Returned pt call. Pt states he is needing a referral to a GI doctor. Made pt aware that he seen a GI specialist on 10/16/19. Pt states he has to pay out of pocket. Made pt aware that if we referred him to a GI specialist he will need to pay out of pocket as well. Made pt aware that he will need to apply for the [employer] discount. Made pt aware that he can see if the GI specialist in [facility] has a Pension scheme manager. Pt states they do have a financial program. Made pt aware that he will need to do the paperwork for it. Pt states he understands and doesn't have any questions or concerns

## 2020-08-14 ENCOUNTER — Telehealth: Payer: Self-pay | Admitting: Gastroenterology

## 2020-08-14 NOTE — Telephone Encounter (Signed)
April,   Is this a referral? I do not see any previous documentation from our office. Looks like patient was last seen by [facility] on 07/15/20.   Thank you

## 2020-08-14 NOTE — Telephone Encounter (Signed)
Dr. Adela Lank DOD on 08/14/20.  Returned call to patient's mother. She gave me a brief history on the patient, she states that he has lost 100 lbs in  6 months, unable to eat or drink, reports nausea and vomiting, she states that patient is epileptic and unable to take medications due to nausea/vomiting. Patient has been seen at [facility] in [place], [place]. Mother reports that they can't find anything wrong and they said that it was the marijuana causing patient's symptoms, she states that he smokes marijuana to help with the pain. She states that his PCP has also done lab work and had some concerns with issues with his pancreas. Advised patient's mother that she would have to request transfer of care to our office and we would need all records from [facility] along with labs from PCP. Advised that once we received this information and it has been reviewed by the provider they will advise on scheduling and next steps. Mother verbalized understanding of all information and had no other concerns at the end of the call, she thanked me for the return call. Will await records.

## 2020-08-14 NOTE — Telephone Encounter (Signed)
Advised patient's wife that patient has to sign a health release of information for with [facility] and they will need to send records since he is technically transferring care. Wife states that they will go by there today and have him sign the papers and get records faxed to [place]. Provided her with our fax number, she had no other concerns at the end of the call.

## 2020-08-14 NOTE — Telephone Encounter (Signed)
Pt's spouse is requesting a call back from a nurse, caller states she was told by [facility] that we would need to request the records by faxing a request to them.  Fax: [phone]

## 2020-08-21 NOTE — Telephone Encounter (Signed)
Dr. Adela Lank, please see below. Records are in care everywhere. Please advise on scheduling, thank you

## 2020-08-21 NOTE — Telephone Encounter (Signed)
We can see him for a second opinion. Would book with APP first available or myself, whomever has an opening sooner. Thanks

## 2020-08-26 NOTE — Telephone Encounter (Signed)
Spoke with patient, he has been scheduled with Doug Sou, PA on 08/27/20 at 8:30 AM. Provided patient with office address and information, he is aware that he will need to check in on the third floor.   Shanda Bumps, just an [place]. Records are in care everywhere.

## 2020-08-27 ENCOUNTER — Encounter (HOSPITAL_COMMUNITY): Payer: Self-pay

## 2020-08-27 ENCOUNTER — Other Ambulatory Visit: Payer: Self-pay

## 2020-08-27 ENCOUNTER — Observation Stay (HOSPITAL_COMMUNITY)
Admission: EM | Admit: 2020-08-27 | Discharge: 2020-08-28 | Disposition: A | Discharge status: Home / Self Care | Payer: Self-pay | Attending: Internal Medicine | Admitting: Internal Medicine

## 2020-08-27 ENCOUNTER — Ambulatory Visit (INDEPENDENT_AMBULATORY_CARE_PROVIDER_SITE_OTHER): Payer: Self-pay | Admitting: Gastroenterology

## 2020-08-27 ENCOUNTER — Telehealth: Payer: Self-pay | Admitting: Gastroenterology

## 2020-08-27 ENCOUNTER — Encounter: Payer: Self-pay | Admitting: Gastroenterology

## 2020-08-27 VITALS — BP 120/70 | HR 62 | Temp 98.4°F | Ht 65.0 in | Wt 137.0 lb

## 2020-08-27 DIAGNOSIS — G40909 Epilepsy, unspecified, not intractable, without status epilepticus: Secondary | ICD-10-CM

## 2020-08-27 DIAGNOSIS — R112 Nausea with vomiting, unspecified: Secondary | ICD-10-CM

## 2020-08-27 DIAGNOSIS — Z20822 Contact with and (suspected) exposure to covid-19: Secondary | ICD-10-CM | POA: Insufficient documentation

## 2020-08-27 DIAGNOSIS — R569 Unspecified convulsions: Secondary | ICD-10-CM

## 2020-08-27 DIAGNOSIS — R1115 Cyclical vomiting syndrome unrelated to migraine: Principal | ICD-10-CM | POA: Insufficient documentation

## 2020-08-27 DIAGNOSIS — R110 Nausea: Secondary | ICD-10-CM | POA: Insufficient documentation

## 2020-08-27 DIAGNOSIS — R1084 Generalized abdominal pain: Secondary | ICD-10-CM | POA: Insufficient documentation

## 2020-08-27 DIAGNOSIS — R634 Abnormal weight loss: Secondary | ICD-10-CM | POA: Insufficient documentation

## 2020-08-27 DIAGNOSIS — F17210 Nicotine dependence, cigarettes, uncomplicated: Secondary | ICD-10-CM | POA: Insufficient documentation

## 2020-08-27 DIAGNOSIS — F1210 Cannabis abuse, uncomplicated: Secondary | ICD-10-CM

## 2020-08-27 LAB — COMPREHENSIVE METABOLIC PANEL
ALT: 36 U/L (ref 0–44)
AST: 24 U/L (ref 15–41)
Albumin: 4.4 g/dL (ref 3.5–5.0)
Alkaline Phosphatase: 59 U/L (ref 38–126)
Anion gap: 11 (ref 5–15)
BUN: 10 mg/dL (ref 6–20)
CO2: 27 mmol/L (ref 22–32)
Calcium: 10.3 mg/dL (ref 8.9–10.3)
Chloride: 106 mmol/L (ref 98–111)
Creatinine, Ser: 1.2 mg/dL (ref 0.61–1.24)
GFR, Estimated: >60 mL/min (ref >60)
Glucose, Bld: 140 mg/dL — ABNORMAL HIGH (ref 70–99)
Potassium: 4.2 mmol/L (ref 3.5–5.1)
Sodium: 144 mmol/L (ref 135–145)
Total Bilirubin: 0.7 mg/dL (ref 0.3–1.2)
Total Protein: 7.8 g/dL (ref 6.5–8.1)

## 2020-08-27 LAB — CBC WITH DIFFERENTIAL/PLATELET
Abs Immature Granulocytes: 0.02 10*3/uL (ref 0.00–0.07)
Basophils Absolute: 0 10*3/uL (ref 0.0–0.1)
Basophils Relative: 0 %
Eosinophils Absolute: 0 10*3/uL (ref 0.0–0.5)
Eosinophils Relative: 0 %
HCT: 46.7 % (ref 39.0–52.0)
Hemoglobin: 15.6 g/dL (ref 13.0–17.0)
Immature Granulocytes: 0 %
Lymphocytes Relative: 10 %
Lymphs Abs: 0.8 10*3/uL (ref 0.7–4.0)
MCH: 31.8 pg (ref 26.0–34.0)
MCHC: 33.4 g/dL (ref 30.0–36.0)
MCV: 95.1 fL (ref 80.0–100.0)
Monocytes Absolute: 0.2 10*3/uL (ref 0.1–1.0)
Monocytes Relative: 3 %
Neutro Abs: 7.2 10*3/uL (ref 1.7–7.7)
Neutrophils Relative %: 87 %
Platelets: 342 10*3/uL (ref 150–400)
RBC: 4.91 MIL/uL (ref 4.22–5.81)
RDW: 14 % (ref 11.5–15.5)
WBC: 8.2 10*3/uL (ref 4.0–10.5)
nRBC: 0 % (ref 0.0–0.2)

## 2020-08-27 LAB — CBG MONITORING, ED: Glucose-Capillary: 142 mg/dL — ABNORMAL HIGH (ref 70–99)

## 2020-08-27 LAB — RESP PANEL BY RT-PCR (FLU A&B, COVID) ARPGX2
Influenza A by PCR: NEGATIVE
Influenza B by PCR: NEGATIVE
SARS Coronavirus 2 by RT PCR: NEGATIVE

## 2020-08-27 LAB — MAGNESIUM: Magnesium: 1.9 mg/dL (ref 1.7–2.4)

## 2020-08-27 MED ORDER — DROPERIDOL 2.5 MG/ML IJ SOLN
1.2500 mg | Freq: Once | INTRAMUSCULAR | Status: AC
  Administered 2020-08-27: 1.25 mg via INTRAVENOUS
  Filled 2020-08-27: qty 2

## 2020-08-27 MED ORDER — PROMETHAZINE HCL 25 MG RE SUPP: 25.0000 mg | Freq: Four times a day (QID) | RECTAL | 0 refills | Status: DC | PRN

## 2020-08-27 MED ORDER — PANTOPRAZOLE SODIUM 40 MG IV SOLR
40.0000 mg | INTRAVENOUS | Status: DC
  Administered 2020-08-27: 40 mg via INTRAVENOUS
  Filled 2020-08-27: qty 40

## 2020-08-27 MED ORDER — MORPHINE SULFATE (PF) 4 MG/ML IV SOLN
4.0000 mg | Freq: Once | INTRAVENOUS | Status: AC
  Administered 2020-08-27: 4 mg via INTRAVENOUS
  Filled 2020-08-27: qty 1

## 2020-08-27 MED ORDER — DIPHENHYDRAMINE HCL 50 MG/ML IJ SOLN
25.0000 mg | Freq: Once | INTRAMUSCULAR | Status: AC
  Administered 2020-08-27: 25 mg via INTRAVENOUS
  Filled 2020-08-27: qty 1

## 2020-08-27 MED ORDER — ENOXAPARIN SODIUM 40 MG/0.4ML ~~LOC~~ SOLN
40.0000 mg | SUBCUTANEOUS | Status: DC
  Administered 2020-08-27: 40 mg via SUBCUTANEOUS
  Filled 2020-08-27: qty 0.4

## 2020-08-27 MED ORDER — LACTATED RINGERS IV SOLN: INTRAVENOUS | Status: DC

## 2020-08-27 MED ORDER — LEVETIRACETAM IN NACL 1000 MG/100ML IV SOLN
1000.0000 mg | Freq: Every morning | INTRAVENOUS | Status: DC
  Administered 2020-08-28: 1000 mg via INTRAVENOUS
  Filled 2020-08-27: qty 100

## 2020-08-27 MED ORDER — SODIUM CHLORIDE 0.9 % IV BOLUS
1000.0000 mL | Freq: Once | INTRAVENOUS | Status: AC
  Administered 2020-08-27: 1000 mL via INTRAVENOUS

## 2020-08-27 MED ORDER — ONDANSETRON HCL 4 MG PO TABS: 4.0000 mg | ORAL_TABLET | Freq: Four times a day (QID) | ORAL | Status: DC | PRN

## 2020-08-27 MED ORDER — ONDANSETRON HCL 4 MG/2ML IJ SOLN: 4.0000 mg | Freq: Four times a day (QID) | INTRAMUSCULAR | Status: DC | PRN

## 2020-08-27 MED ORDER — ONDANSETRON 4 MG PO TBDP: ORAL_TABLET | ORAL | 0 refills | Status: DC

## 2020-08-27 MED ORDER — MORPHINE SULFATE (PF) 2 MG/ML IV SOLN
1.0000 mg | INTRAVENOUS | Status: DC | PRN
  Administered 2020-08-27: 1 mg via INTRAVENOUS
  Filled 2020-08-27: qty 1

## 2020-08-27 MED ORDER — ALUM & MAG HYDROXIDE-SIMETH 200-200-20 MG/5ML PO SUSP
30.0000 mL | Freq: Once | ORAL | Status: AC
  Administered 2020-08-27: 30 mL via ORAL
  Filled 2020-08-27: qty 30

## 2020-08-27 MED ORDER — LEVETIRACETAM IN NACL 500 MG/100ML IV SOLN
500.0000 mg | Freq: Every day | INTRAVENOUS | Status: DC
  Administered 2020-08-27: 500 mg via INTRAVENOUS
  Filled 2020-08-27: qty 100

## 2020-08-27 MED ORDER — LEVETIRACETAM IN NACL 1000 MG/100ML IV SOLN
1000.0000 mg | Freq: Once | INTRAVENOUS | Status: AC
  Administered 2020-08-27: 1000 mg via INTRAVENOUS
  Filled 2020-08-27: qty 100

## 2020-08-27 NOTE — ED Notes (Signed)
Coming from GI-per PA needs further work up for intractable vomiting-100 lb weight loss, unable to take epilepsy meds-history of THC use-refer to outpatient note

## 2020-08-27 NOTE — Patient Instructions (Signed)
If you are age 35 or older, your body mass index should be between 23-30. Your Body mass index is 22.8 kg/m. If this is out of the aforementioned range listed, please consider follow up with your Primary Care Provider.  If you are age 72 or younger, your body mass index should be between 19-25. Your Body mass index is 22.8 kg/m. If this is out of the aformentioned range listed, please consider follow up with your Primary Care Provider.   Go to [facility]. Charge nurse has been made aware.

## 2020-08-27 NOTE — Discharge Instructions (Signed)
Try pepcid or tagamet up to twice a day.  Try to avoid things that may make this worse, most commonly these are spicy foods tomato based products fatty foods chocolate and peppermint.  Alcohol and tobacco can also make this worse.  Return to the emergency department for sudden worsening pain fever or inability to eat or drink.  

## 2020-08-27 NOTE — Telephone Encounter (Signed)
Pt's wife Dois Davenport called stating that pt is about to be discharged from the ED. She states that he has not had a GI consult despite her request, she said that "nobody is listening to her." She would like for Shanda Bumps to call the ED and speak with somebody so pt can have a GI evaluation or be admitted.

## 2020-08-27 NOTE — ED Notes (Signed)
Pt and pt family member state pt is not supposed to be discharged, they are asking for MD. Dr. Adela Lank aware.

## 2020-08-27 NOTE — Progress Notes (Signed)
Patient is a 34 yo male with hx of epilepsy and polysubstance abuse.seen as a new patient in our office today for evaluation of nausea / vomiting with weight loss. He has had objective weight loss of 61 pounds since May 2021. Patient has a longstanding history of abdominal pain, nausea / vomiting. He has had numerous abdominal CT scan of the abdomen from multiple hospitals ( [facility]) revealing of only distal esophageal wall thickening suggestive of esophagitis.   Uses THC but apparently nausea / vomiting didn't get better after a period of abstinence. He saw GI with [facility] in October, was scheduled for EGD but apparently things didn't work out between the patient and that practice so he was sent to [place] for another opinion. Dr. Adela Lank had reviewed the records and agreed to see him.   Will arrange for EGD to be done tomorrow or Thursday.

## 2020-08-27 NOTE — Progress Notes (Signed)
Agree with assessment and plan as outlined by PA Zehr.

## 2020-08-27 NOTE — H&P (Signed)
History and Physical        Hospital Admission Note Date: 08/27/2020  Patient name: Greg Morales [facility] Medical record number: [id number] Date of birth: Dec 12, 1985 Age: 34 y.o. Gender: male  PCP: [name], MD  Patient coming from: Home Lives with: Wife At baseline, ambulates: Independently  Chief Complaint    Chief Complaint  Patient presents with  . Seizures  . Abdominal Pain  . Emesis      HPI:   This is a 34 year old male with past medical history of seizure disorder and marijuana use who presented to the ED from the [facility] office for roughly 80 pound weight loss and intractable nausea and vomiting for the past 5-6 months.  He was seen this a.m. by Doug Sou, PA in the GI office at which point the patient was actively vomiting and writhing in pain and crying and was unable to provide any information at the time.  Per prior note he was seen by gastro practice in [place] back in October and had some labs performed and he was going to have an endoscopy but unfortunately never had it done.  Per wife at bedside the patient had an endoscopy at least 10 years ago in [place] but they were not in a relationship at the time and she is unaware of what the results were.  Since May to June the patient has lost significant amount of weight.  In May he had a CT abdomen pelvis with contrast which showed esophagitis.  He has been unable to keep any food down for that period of time and his wife states that he only eats bits and pieces of food here and there.  No known cancer history and no history of HIV or IV drug use.  Patient's wife states that after he was seen at the GI in [place] he was told his symptoms were due to marijuana use but unfortunately did not want to hear this in the past.  Wife states after that encounter, he had stopped smoking marijuana for 2 months.  He  currently has not smoked marijuana for the past 2 weeks.  Unfortunately given his symptoms he has been unable to keep any of his medications down including his antiseizure medications and subsequently had a seizure about 2 weeks ago.  Wife also states that he had some slight hematemesis this morning during the vomiting episode  ED Course: Afebrile, hemodynamically stable, on room air.  Labs overall unremarkable.  Patient received Maalox, Benadryl, droperidol, morphine, 1 L NS bolus and Keppra load.    Vitals:   08/27/20 1430 08/27/20 1500  BP: (!) 121/92 120/82  Pulse: 70 70  Resp: 18 19  Temp:    SpO2: 100% 99%     Review of Systems:  Review of Systems  All other systems reviewed and are negative.   Medical/Social/Family History   Past Medical History: Past Medical History:  Diagnosis Date  . Chronic abdominal pain   . Medically noncompliant   . Polysubstance abuse (HCC)   . Seizures (HCC)    since childhood    Past Surgical History:  Procedure Laterality Date  . ESOPHAGOGASTRODUODENOSCOPY      Medications: Prior to Admission  medications   Medication Sig Start Date End Date Taking? Authorizing Provider  lamoTRIgine (LAMICTAL) 100 MG tablet Take 1 tablet (100 mg total) by mouth daily. Take 3 tablets by mouth in the evening with meals Patient not taking: Reported on 02/05/2020 05/24/18   [name], MD  levETIRAcetam (KEPPRA) 500 MG tablet Take 2 tabs PO Q a.m and 1 tab Po Q p.m Patient not taking: Reported on 08/27/2020 07/31/19   [name], MD  ondansetron (ZOFRAN ODT) 4 MG disintegrating tablet 4mg  ODT q4 hours prn nausea/vomit 08/27/20   08/29/20, DO  promethazine (PHENERGAN) 25 MG suppository Place 1 suppository (25 mg total) rectally every 6 (six) hours as needed for nausea or vomiting. 08/27/20   08/29/20, DO  sucralfate (CARAFATE) 1 GM/10ML suspension Take 10 mLs (1 g total) by mouth 4 (four) times daily -  with meals and at bedtime. Patient not  taking: Reported on 04/22/2020 02/05/20   04/06/20, PA-C    Allergies:  No Known Allergies  Social History:  reports that he has been smoking cigarettes. He has a 5.00 pack-year smoking history. He has never used smokeless tobacco. He reports previous alcohol use. He reports previous drug use. Drug: Marijuana.  Family History: Family History  Problem Relation Age of Onset  . Hypertension Other   . Seizures Other   . Seizures Maternal Grandmother      Objective   Physical Exam: Blood pressure 120/82, pulse 70, temperature 97.6 F (36.4 C), temperature source Oral, resp. rate 19, SpO2 99 %.  Physical Exam Vitals and nursing note reviewed. Exam conducted with a chaperone present.  Constitutional:      Appearance: Normal appearance.     Comments: Somnolent and continues to fall asleep but arouses to verbal stimuli and responds to questions  HENT:     Head: Normocephalic and atraumatic.     Mouth/Throat:     Mouth: Mucous membranes are moist.     Pharynx: No oropharyngeal exudate.  Eyes:     Conjunctiva/sclera: Conjunctivae normal.  Cardiovascular:     Rate and Rhythm: Normal rate and regular rhythm.  Pulmonary:     Effort: Pulmonary effort is normal.     Breath sounds: Normal breath sounds.  Abdominal:     General: Abdomen is flat.     Palpations: Abdomen is soft.     Tenderness: There is generalized abdominal tenderness.  Musculoskeletal:        General: No swelling or tenderness.  Skin:    Coloration: Skin is not jaundiced or pale.  Neurological:     Mental Status: He is alert. Mental status is at baseline.  Psychiatric:        Mood and Affect: Mood normal.        Behavior: Behavior normal.     LABS on Admission: I have personally reviewed all the labs and imaging below    Basic Metabolic Panel: Recent Labs  Lab 08/27/20 1025  NA 144  K 4.2  CL 106  CO2 27  GLUCOSE 140*  BUN 10  CREATININE 1.20  CALCIUM 10.3  MG 1.9   Liver Function  Tests: Recent Labs  Lab 08/27/20 1025  AST 24  ALT 36  ALKPHOS 59  BILITOT 0.7  PROT 7.8  ALBUMIN 4.4   No results for input(s): LIPASE, AMYLASE in the last 168 hours. No results for input(s): AMMONIA in the last 168 hours. CBC: Recent Labs  Lab 08/27/20 1025  WBC 8.2  NEUTROABS 7.2  HGB 15.6  HCT 46.7  MCV 95.1  PLT 342   Cardiac Enzymes: No results for input(s): CKTOTAL, CKMB, CKMBINDEX, TROPONINI in the last 168 hours. BNP: Invalid input(s): POCBNP CBG: Recent Labs  Lab 08/27/20 1024  GLUCAP 142*    Radiological Exams on Admission:  No results found.    EKG: not done   A & P   Principal Problem:   Intractable nausea and vomiting Active Problems:   Marijuana abuse   Seizures (HCC)   1. Intractable nausea and vomiting of unknown etiology with significant weight loss a. Weight loss is likely at least in part due to poor p.o. intake for several months time due to his vomiting b. GI consulted, plan for EGD this hospitalization, possibly tomorrow c. will keep n.p.o.  2. Epilepsy a. Last seizure 2 weeks ago b. Unable to take his AEDs due to #1 c. Does not take Lamictal d. Received a Keppra load in the ED  e. will continue with IV Keppra   3. Marijuana use a. Potentially with cannabinoid hyperemesis syndrome however the patient had abstained from [facility] for about 2 months in the past without resolution of symptoms and has not smoked for 2 weeks so not sure if this is the etiology b. Encourage cessation   DVT prophylaxis: lovenox   Code Status: Not on file  Diet: npo sips with ice chips, n.p.o. after midnight Family Communication: Admission, patients condition and plan of care including tests being ordered have been discussed with the patient who indicates understanding and agrees with the plan and Code Status. Patient's wife was updated  Disposition Plan: The appropriate patient status for this patient is OBSERVATION. Observation status is judged to  be reasonable and necessary in order to provide the required intensity of service to ensure the patient's safety. The patient's presenting symptoms, physical exam findings, and initial radiographic and laboratory data in the context of their medical condition is felt to place them at decreased risk for further clinical deterioration. Furthermore, it is anticipated that the patient will be medically stable for discharge from the hospital within 2 midnights of admission. The following factors support the patient status of observation.   " The patient's presenting symptoms include intractable nausea and vomiting. " The physical exam findings include abdominal tenderness. " The initial radiographic and laboratory data are unremarkable.    Consultants  . GI  Procedures  . None  Time Spent on Admission: 60 minutes    Jae Dire, DO [facility]  08/27/2020, 3:07 PM

## 2020-08-27 NOTE — ED Provider Notes (Addendum)
[facility]-EMERGENCY DEPT Provider Note   CSN: [id number] Arrival date & time: 08/27/20  2725     History Chief Complaint  Patient presents with   Seizures   Abdominal Pain   Emesis    Greg Morales is a 34 y.o. male.  34 yo M with a chief complaints of intractable nausea vomiting and seizures.  Patient has a history of seizures and has been unable to take his seizure medication due to the nausea and vomiting.  Went to see gastroenterology for evaluation of intractable nausea vomiting today and they sent him to the ED for evaluation.  He denies any change from his typical.  Has a burning pain to the central portion of his abdomen.  Worse with eating or drinking.  Denies fevers.  Denies cough or congestion.  Denies headache or head injury with his seizure activity.  States he is on Keppra.  Has not been able to take it for a few days since he has been ill.  The history is provided by the patient.  Seizures Abdominal Pain Associated symptoms: nausea and vomiting   Associated symptoms: no chest pain, no chills, no diarrhea, no fever and no shortness of breath   Emesis Associated symptoms: abdominal pain   Associated symptoms: no arthralgias, no chills, no diarrhea, no fever, no headaches and no myalgias   Illness Severity:  Moderate Onset quality:  Gradual Duration:  4 days Timing:  Constant Progression:  Worsening Chronicity:  New Associated symptoms: abdominal pain, nausea and vomiting   Associated symptoms: no chest pain, no congestion, no diarrhea, no fever, no headaches, no myalgias, no rash and no shortness of breath        Past Medical History:  Diagnosis Date   Chronic abdominal pain    Medically noncompliant    Polysubstance abuse (HCC)    Seizures (HCC)    since childhood    Patient Active Problem List   Diagnosis Date Noted   Nausea and vomiting 08/27/2020   Loss of weight 08/27/2020   Lung nodule < 6cm on CT 07/27/2019    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) 05/24/2018   Tobacco dependence 05/24/2018   History of bipolar disorder 05/24/2018   Elevated blood lead level 05/24/2018    Past Surgical History:  Procedure Laterality Date   ESOPHAGOGASTRODUODENOSCOPY         Family History  Problem Relation Age of Onset   Hypertension Other    Seizures Other    Seizures Maternal Grandmother     Social History   Tobacco Use   Smoking status: Current Every Day Smoker    Packs/day: 1.00    Years: 5.00    Pack years: 5.00    Types: Cigarettes   Smokeless tobacco: Never Used  Vaping Use   Vaping Use: Never used  Substance Use Topics   Alcohol use: Not Currently   Drug use: Not Currently    Types: Marijuana    Home Medications Prior to Admission medications   Medication Sig Start Date End Date Taking? Authorizing Provider  lamoTRIgine (LAMICTAL) 100 MG tablet Take 1 tablet (100 mg total) by mouth daily. Take 3 tablets by mouth in the evening with meals Patient not taking: Reported on 02/05/2020 05/24/18   [name], MD  levETIRAcetam (KEPPRA) 500 MG tablet Take 2 tabs PO Q a.m and 1 tab Po Q p.m Patient not taking: Reported on 08/27/2020 07/31/19   [name], MD  ondansetron (ZOFRAN ODT) 4 MG  disintegrating tablet 4mg  ODT q4 hours prn nausea/vomit 08/27/20   08/29/20, DO  promethazine (PHENERGAN) 25 MG suppository Place 1 suppository (25 mg total) rectally every 6 (six) hours as needed for nausea or vomiting. 08/27/20   08/29/20, DO  sucralfate (CARAFATE) 1 GM/10ML suspension Take 10 mLs (1 g total) by mouth 4 (four) times daily -  with meals and at bedtime. Patient not taking: Reported on 04/22/2020 02/05/20   04/06/20, PA-C    Allergies    Patient has no known allergies.  Review of Systems   Review of Systems  Constitutional: Negative for chills and fever.  HENT: Negative for congestion and facial swelling.   Eyes: Negative for discharge and visual  disturbance.  Respiratory: Negative for shortness of breath.   Cardiovascular: Negative for chest pain and palpitations.  Gastrointestinal: Positive for abdominal pain, nausea and vomiting. Negative for diarrhea.  Musculoskeletal: Negative for arthralgias and myalgias.  Skin: Negative for color change and rash.  Neurological: Positive for seizures. Negative for tremors, syncope and headaches.  Psychiatric/Behavioral: Negative for confusion and dysphoric mood.    Physical Exam Updated Vital Signs BP (!) 144/98 (BP Location: Left Arm)    Pulse 82    Temp 97.6 F (36.4 C) (Oral)    Resp 16    SpO2 100%   Physical Exam Vitals and nursing note reviewed.  Constitutional:      Appearance: He is well-developed.  HENT:     Head: Normocephalic and atraumatic.  Eyes:     Pupils: Pupils are equal, round, and reactive to light.  Neck:     Vascular: No JVD.  Cardiovascular:     Rate and Rhythm: Normal rate and regular rhythm.     Heart sounds: No murmur heard.  No friction rub. No gallop.   Pulmonary:     Effort: No respiratory distress.     Breath sounds: No wheezing.  Abdominal:     General: There is no distension.     Tenderness: There is abdominal tenderness (mild diffuse pain). There is no guarding or rebound.  Musculoskeletal:        General: Normal range of motion.     Cervical back: Normal range of motion and neck supple.  Skin:    Coloration: Skin is not pale.     Findings: No rash.  Neurological:     Mental Status: He is alert and oriented to person, place, and time.  Psychiatric:        Behavior: Behavior normal.     ED Results / Procedures / Treatments   Labs (all labs ordered are listed, but only abnormal results are displayed) Labs Reviewed  COMPREHENSIVE METABOLIC PANEL - Abnormal; Notable for the following components:      Result Value   Glucose, Bld 140 (*)    All other components within normal limits  CBG MONITORING, ED - Abnormal; Notable for the following  components:   Glucose-Capillary 142 (*)    All other components within normal limits  CBC WITH DIFFERENTIAL/PLATELET  MAGNESIUM    EKG None  Radiology No results found.  Procedures Procedures (including critical care time)  Medications Ordered in ED Medications  sodium chloride 0.9 % bolus 1,000 mL (1,000 mLs Intravenous New Bag/Given 08/27/20 1039)  droperidol (INAPSINE) 2.5 MG/ML injection 1.25 mg (1.25 mg Intravenous Given 08/27/20 1036)  levETIRAcetam (KEPPRA) IVPB 1000 mg/100 mL premix (1,000 mg Intravenous New Bag/Given 08/27/20 1039)  alum & mag hydroxide-simeth (MAALOX/MYLANTA) 200-200-20 MG/5ML suspension  30 mL (30 mLs Oral Given 08/27/20 1040)  morphine 4 MG/ML injection 4 mg (4 mg Intravenous Given 08/27/20 1038)  diphenhydrAMINE (BENADRYL) injection 25 mg (25 mg Intravenous Given 08/27/20 1038)    ED Course  I have reviewed the triage vital signs and the nursing notes.  Pertinent labs & imaging results that were available during my care of the patient were reviewed by me and considered in my medical decision making (see chart for details).    MDM Rules/Calculators/A&P                          34 yo M with a significant past medical history of cyclic vomiting thought to be due to cannabinoid hyperemesis syndrome here with nausea and vomiting.  Patient had just established with gastroenterology but unfortunately was sent to the ED for evaluation.  Will obtain a laboratory evaluation treat with pain and nausea medicine.  Loaded on Keppra.  Reassess.  Patient feeling better on reassessment. Abdominal pain and nausea and vomiting have ceased. Tolerating p.o. Will discharge patient home. Have him follow-up with GI and PCP. Will have him start an H2 blocker. I again stressed with family the reasons for ceasing marijuana use. They seem pretty confident that he has quit.  I suspect his seizure activity was due to inability to take his Keppra due to nausea and vomiting. He is  currently at his neurologic baseline, no headaches or head injury.  Upon being discharged the family had called the gastroenterology office and they had reportedly tried to reach out to [place].  The gastroenterology note has now been completed and it looks per their note that they would like to have the patient admitted to the hospitalist service for likely endoscopy.  Will discuss with the hospitalist for admission.  Medications given during this visit Medications  sodium chloride 0.9 % bolus 1,000 mL (1,000 mLs Intravenous New Bag/Given 08/27/20 1039)  droperidol (INAPSINE) 2.5 MG/ML injection 1.25 mg (1.25 mg Intravenous Given 08/27/20 1036)  levETIRAcetam (KEPPRA) IVPB 1000 mg/100 mL premix (1,000 mg Intravenous New Bag/Given 08/27/20 1039)  alum & mag hydroxide-simeth (MAALOX/MYLANTA) 200-200-20 MG/5ML suspension 30 mL (30 mLs Oral Given 08/27/20 1040)  morphine 4 MG/ML injection 4 mg (4 mg Intravenous Given 08/27/20 1038)  diphenhydrAMINE (BENADRYL) injection 25 mg (25 mg Intravenous Given 08/27/20 1038)     Final Clinical Impression(s) / ED Diagnoses Final diagnoses:  Cyclic vomiting syndrome  Seizure disorder (HCC)    Rx / DC Orders ED Discharge Orders         Ordered    ondansetron (ZOFRAN ODT) 4 MG disintegrating tablet        08/27/20 1215    promethazine (PHENERGAN) 25 MG suppository  Every 6 hours PRN        08/27/20 1215              Melene Plan, DO 08/27/20 1607

## 2020-08-27 NOTE — ED Notes (Signed)
Message left for RN that PA from [facility] wished to speak to RN or provider before discharge. Unable to get a hold of person from [place], RN constantly on hold. Dr. Adela Lank aware. Will discharge patient.

## 2020-08-27 NOTE — ED Triage Notes (Signed)
Patient reports that he had a witnessed seizure by his wife "early this morning". Patient denies hitting his head or having any other injuries. Patient also c/o abdominal pain and vomiting tht started this AM.

## 2020-08-27 NOTE — Progress Notes (Signed)
08/27/2020 Greg Morales [id number] 1985-11-03   HISTORY OF PRESENT ILLNESS: This is a 34 year old male who is new to our office. He is here today with his significant other. The patient is actively vomiting and writhing around in pain and crying in the office today so did not provide me any information. History was limited. A lot of this was obtained from the chart. He was seen by a gastro practice in [place] back in October. Looks like they had some labs performed and he was going to have an endoscopy but from it looks like he never had that performed. They report that he has had ongoing issues with nausea vomiting, unable to keep anything down since May. They say he has lost about 100 pounds. The chart reports that he had similar issue years ago while in [place], but no details are know about that as the patient was not able to tell me anything about that his visit today. His significant other was unaware of any of this as they were not together at that time. It looks like he has been told that his nausea and vomiting is due to his cannabis use, but he has not wanted to hear that in the past. Reports that he has tried Protonix, Pepcid, p.o. Zofran, Carafate, etc. without relief of symptoms. He has had multiple abdominal imaging studies that have been unremarkable except for couple of them (CT scans) showing esophagitis.  He is epileptic and has been unable to take his seizure medications. His last seizure was 2 weeks ago.   Past Medical History:  Diagnosis Date  . Chronic abdominal pain   . Medically noncompliant   . Polysubstance abuse (HCC)   . Seizures (HCC)    since childhood   Past Surgical History:  Procedure Laterality Date  . ESOPHAGOGASTRODUODENOSCOPY      reports that he has been smoking cigarettes. He has a 5.00 pack-year smoking history. He has never used smokeless tobacco. He reports previous alcohol use. He reports previous drug use. Drug: Marijuana. family  history includes Hypertension in an other family member; Seizures in his maternal grandmother and another family member. No Known Allergies    Outpatient Encounter Medications as of 08/27/2020  Medication Sig  . lamoTRIgine (LAMICTAL) 100 MG tablet Take 1 tablet (100 mg total) by mouth daily. Take 3 tablets by mouth in the evening with meals (Patient not taking: Reported on 02/05/2020)  . levETIRAcetam (KEPPRA) 500 MG tablet Take 2 tabs PO Q a.m and 1 tab Po Q p.m (Patient not taking: Reported on 08/27/2020)  . ondansetron (ZOFRAN ODT) 4 MG disintegrating tablet Take 1 tablet (4 mg total) by mouth every 8 (eight) hours as needed for nausea or vomiting. (Patient not taking: Reported on 02/05/2020)  . promethazine (PHENERGAN) 25 MG tablet Take 1 tablet (25 mg total) by mouth every 6 (six) hours as needed for nausea or vomiting. (Patient not taking: Reported on 04/22/2020)  . sucralfate (CARAFATE) 1 GM/10ML suspension Take 10 mLs (1 g total) by mouth 4 (four) times daily -  with meals and at bedtime. (Patient not taking: Reported on 04/22/2020)  . [DISCONTINUED] dicyclomine (BENTYL) 20 MG tablet Take 1 tablet (20 mg total) by mouth 2 (two) times daily. (Patient not taking: Reported on 04/22/2020)  . [DISCONTINUED] mirtazapine (REMERON) 15 MG tablet Take 1 tablet (15 mg total) by mouth at bedtime. Take 1/2 tablet by mouth at bedtime (Patient not taking: Reported on 02/05/2020)  . [  DISCONTINUED] pantoprazole (PROTONIX) 20 MG tablet Take 1 tablet (20 mg total) by mouth daily for 14 days.   No facility-administered encounter medications on file as of 08/27/2020.    REVIEW OF SYSTEMS  : All other systems reviewed and negative except where noted in the History of Present Illness.   PHYSICAL EXAM: BP 120/70   Pulse 62   Temp 98.4 F (36.9 C)   Ht 5\' 5"  (1.651 m)   Wt 137 lb (62.1 kg)   BMI 22.80 kg/m  General: Well developed AA male; actively vomiting Head: Normocephalic and atraumatic Eyes:  Sclerae  anicteric, conjunctiva pink. Ears: Normal auditory acuity Lungs: Clear throughout to auscultation; no W/R/R. Heart: Regular rate and rhythm; no M/R/G. Abdomen: Soft, non-distended.  BS present.  Diffuse TTP. Musculoskeletal: Symmetrical with no gross deformities  Skin: No lesions on visible extremities Extremities: No edema  Neurological: Alert oriented x 4, grossly non-focal Psychological:  Alert and cooperative. Normal mood and affect  ASSESSMENT AND PLAN: *34 year old male with nausea and vomiting, abdominal pain, and significant weight loss since May:  Unable to keep anything down.  Actively vomiting in the office here today.  He has undergone multiple imaging studies of his abdomen with the only finding of esophagitis (probably a consequence of his vomiting).   *Epilepsy:  Last seizure 2 weeks ago.  Has been unable to take his meds due to symptoms. *Cannabis abuse:  Has been told that this is probably the cause of his symptoms, but reportedly has not wanted to hear this in the past.  -Patient is actively vomiting today in our office and writhing around in pain and crying. I am sending him to the ER. I called and spoke with the charge nurse. He should have CT scan of his head with his history of epilepsy and his ongoing issue of nausea and vomiting. Likely will need IV fluids and IV antiemetics, etc. He probably will need an endoscopy from our standpoint and so hopefully they can admit him to the hospitalist for observation and we can perform an endoscopy and help try to sort this as an inpatient. Otherwise he will need an outpatient procedure performed in the hospital setting and that may take weeks before that will be done. Our inpatient team was made aware of him as well and can be contacted by the ED or admitting physician once he has been seen, etc.  CC:  June, MD

## 2020-08-28 ENCOUNTER — Encounter (HOSPITAL_COMMUNITY): Payer: Self-pay | Admitting: Internal Medicine

## 2020-08-28 ENCOUNTER — Encounter (HOSPITAL_COMMUNITY)
Admission: EM | Disposition: A | Discharge status: Home / Self Care | Payer: Self-pay | Source: Home / Self Care | Attending: Emergency Medicine

## 2020-08-28 DIAGNOSIS — R45851 Suicidal ideations: Secondary | ICD-10-CM

## 2020-08-28 LAB — CBC
HCT: 43.1 % (ref 39.0–52.0)
Hemoglobin: 14.2 g/dL (ref 13.0–17.0)
MCH: 31.8 pg (ref 26.0–34.0)
MCHC: 32.9 g/dL (ref 30.0–36.0)
MCV: 96.4 fL (ref 80.0–100.0)
Platelets: 282 10*3/uL (ref 150–400)
RBC: 4.47 MIL/uL (ref 4.22–5.81)
RDW: 14.4 % (ref 11.5–15.5)
WBC: 8.1 10*3/uL (ref 4.0–10.5)
nRBC: 0 % (ref 0.0–0.2)

## 2020-08-28 LAB — BASIC METABOLIC PANEL
Anion gap: 8 (ref 5–15)
BUN: 10 mg/dL (ref 6–20)
CO2: 26 mmol/L (ref 22–32)
Calcium: 9.2 mg/dL (ref 8.9–10.3)
Chloride: 109 mmol/L (ref 98–111)
Creatinine, Ser: 0.99 mg/dL (ref 0.61–1.24)
GFR, Estimated: >60 mL/min (ref >60)
Glucose, Bld: 95 mg/dL (ref 70–99)
Potassium: 3.5 mmol/L (ref 3.5–5.1)
Sodium: 143 mmol/L (ref 135–145)

## 2020-08-28 LAB — HIV ANTIBODY (ROUTINE TESTING W REFLEX): HIV Screen 4th Generation wRfx: NONREACTIVE

## 2020-08-28 SURGERY — CANCELLED PROCEDURE

## 2020-08-28 MED ORDER — HALOPERIDOL LACTATE 5 MG/ML IJ SOLN: 2.0000 mg | Freq: Four times a day (QID) | INTRAMUSCULAR | Status: DC | PRN

## 2020-08-28 MED ORDER — DIPHENHYDRAMINE HCL 50 MG/ML IJ SOLN
INTRAMUSCULAR | Status: AC
  Filled 2020-08-28: qty 2

## 2020-08-28 MED ORDER — FENTANYL CITRATE (PF) 100 MCG/2ML IJ SOLN
INTRAMUSCULAR | Status: AC
  Filled 2020-08-28: qty 4

## 2020-08-28 MED ORDER — MIDAZOLAM HCL (PF) 5 MG/ML IJ SOLN
INTRAMUSCULAR | Status: AC
  Filled 2020-08-28: qty 3

## 2020-08-28 SURGICAL SUPPLY — 14 items

## 2020-08-28 NOTE — Progress Notes (Signed)
Pt brought to Endoscopy, stated he was "tired and didn't want do this anymore", Marlene Pfluger, Rn confirmed with pt that he was suicidal and not just tired of being in hospital,  Gunnar Fusi, NP made aware by Modena Morrow RN, per Gunnar Fusi, NP, procedure cancelled by Dr. Meridee Score

## 2020-08-28 NOTE — Progress Notes (Addendum)
Patient went down to EGD and stated that he didn't want to live anymore. Patient became very agitated and the procedure was canceled. Patient was then escorted back to the floor by security. Patient was agitiated, combative, yelling, and insisting on leaving. MD was notified and inpatient psych was consulted. Wife was notified of situation and states that she does not think the patient is a threat to hurting himself. Patient left AMA. MD made aware.

## 2020-08-28 NOTE — Consult Note (Signed)
  Patient is a 34 year old male with past medical history of seizure disorder, marijuana use who presented to the emergency room with an 80 pound weight loss, and intractable nausea and vomiting for the past 5 to 6 months.  Patient was worked up while in the emergency room and determined to be followed up outpatient, however upon speaking with his wife Kennyth Lose a it was determined patient should be admitted to the hospital for further management of intractable nausea and vomiting.  Psych consult was placed reportedly suicidal, had a text on his phone stating he wanted to kill himself, extreme aggression and agitation, verbally abusive.  This nurse practitioner reported immediately to the unit upon receiving his consult order, and was greeted by nursing unit director, [employer] security team, and [employer].  Writer was advised at the time the patient was very agitated, labile, belligerent and threatening and was told to avoid the area if possible.  While receiving updated information, patient approached elevator in an attempt to leave.  He was being placed under IVC at the time due to reported suicidal statements he made while in the endoscopy suite.  Patient would not allow this nurse practitioner to speak, and was very abrupt and blunted in conversation.  When assessing for suicidality he would not answer, however he did state he would hurt someone if we did not let him go. "  Bitch I will hurt her to if you do not get me the Fuck up out of here. "  Patient was exhibiting significant psychomotor agitation to include shaking of the leg, face balled up, sweating, yelling and screaming.  Patient then was observed to exit the elevator in an attempt to find the stairs.  Patient patient was overheard stating that he said he was suicidal because he can get the procedure done.  He was unwilling to talk to staff, collateral was obtained from his wife Brixton Franko who states patient has never attempted  to hurt himself, she has no concerns about his overall safety.  She appeared to be more concerned about his medical health, and wanted to encourage him to stay to have the procedure completed.  She denied any concerns regarding his safety.  Patient was allowed to leave, and will not be placed under IVC at this time.  He will be discharged AMA, his wife was notified.  As patient is a low risk to self in terms of suicidal risk.  He is not an imminent danger to himself, while there is a greater risk to harming others if he is held in the hospital.  Patient with no previous suicide attempt, suicidal intentions and or suicidal thoughts.  Did discuss with his wife after allowing patient to leave his low risks to harm himself, therefore he will not be placed under IVC.  Did communicate with his wife if she is concerned ever about his safety and or safety of others she can have patient committed at the magistrate office.  She appeared very frustrated I am more concerned about his overall medical health and wanting him to have a EGD.  She is advised even though patient is discharging AMA, he can return to the hospital at any time or if she chooses he can follow-up with outpatient GI for further management of his intractable nausea and vomiting.  Psychiatry to sign off.

## 2020-08-28 NOTE — Discharge Summary (Signed)
Physician Discharge Summary  Greg MinionWilliam A Morales WUJ:[id number] DOB: [birth year] DOA: 08/27/2020  PCP: Marcine MatarJohnson, Deborah B, MD  Admit date: 08/27/2020 Discharge date: 08/28/2020  Admitted From: Home Disposition: Left AMA  Recommendations for Outpatient Follow-up:  1. Follow up with PCP in 1-2 weeks 2. Follow up with Gastroenterology within 1-2 weeks 3. Follow up with Psychiatry within 1-2 weeks 4. Please obtain CMP/CBC, Mag, Phos in one week 5. Please follow up on the following pending results:  Home Health: No Equipment/Devices: None   Discharge Condition: Guarded CODE STATUS: FULL CODE Diet recommendation: Heart Healthy   Brief/Interim Summary: HPI per Dr. Whitney PostJared Segal on 08/27/20 This is a 34 year old male with past medical history of seizure disorder and marijuana use who presented to the ED from the [facility] office for roughly 80 pound weight loss and intractable nausea and vomiting for the past 5-6 months.  He was seen this a.m. by Doug SouJessica Zehr, PA in the GI office at which point the patient was actively vomiting and writhing in pain and crying and was unable to provide any information at the time.  Per prior note he was seen by gastro practice in [place] back in October and had some labs performed and he was going to have an endoscopy but unfortunately never had it done.  Per wife at bedside the patient had an endoscopy at least 10 years ago in [place] but they were not in a relationship at the time and she is unaware of what the results were.  Since May to June the patient has lost significant amount of weight.  In May he had a CT abdomen pelvis with contrast which showed esophagitis.  He has been unable to keep any food down for that period of time and his wife states that he only eats bits and pieces of food here and there.  No known cancer history and no history of HIV or IV drug use.  Patient's wife states that after he was seen at the GI in [place] he was told his symptoms  were due to marijuana use but unfortunately did not want to hear this in the past.  Wife states after that encounter, he had stopped smoking marijuana for 2 months.  He currently has not smoked marijuana for the past 2 weeks.  Unfortunately given his symptoms he has been unable to keep any of his medications down including his antiseizure medications and subsequently had a seizure about 2 weeks ago.  Wife also states that he had some slight hematemesis this morning during the vomiting episode  ED Course: Afebrile, hemodynamically stable, on room air.  Labs overall unremarkable.  Patient received Maalox, Benadryl, droperidol, morphine, 1 L NS bolus and Keppra load.   **Interim History  Patient went down for his EGD but then was refusing and stated that he was actively suicidal.  EGD was canceled given that was currently to be done under conscious sedation and the GI doctor did not want to get the patient found on Versed when he made a suicidal statement.  Psychiatry was immediately consulted and patient was made one-to-one however became extremely agitated and Haldol was ordered.  Patient was going to be involuntary committed however at the Psych nurse practitioner Alvin Critchleyakia Starkes-Perryevaluated him and felt that he had just made the statement and was not in any imminent danger to himself or to others.  Collateral was obtained from the patient's wife and patient left AMA without being IVC'd given that psych did not truly felt  that he was actively suicidal.  Patient was extremely agitated and they felt that having an IVC for the patient would be more detrimental to the patient given his level of psychomotor agitation.  Patient left AGAINST MEDICAL ADVICE and EGD was not done.  He will need to follow-up with PCP, gastroenterology as well as psychiatry in outpatient setting as he is deemed stable from a psychiatric perspective not to be involuntary committed  Discharge Diagnoses:  Principal Problem:    Intractable nausea and vomiting Active Problems:   Marijuana abuse   Seizures (HCC)  Intractable nausea and vomiting of unknown etiology with significant weight loss -Weight loss is likely at least in part due to poor p.o. intake for several months time due to his vomiting -GI consulted, plan for EGD this hospitalization but then patient refused and then made suicidal comments while he was in the endoscopy suite; GI canceled the EGD as it was supposed to be under conscious sedation -Patient was kept n.p.o. but then became extremely belligerent and agitated -Psychiatric nurse practitioner was consulted urgently and evaluated and feel the patient was not in any imminent danger to himself and deemed the patient stable from a psychiatric perspective and patient left AMA  Epilepsy -Last seizure 2 weeks ago -Unable to take his AEDs due to #1 -Does not take Lamictal -Received a Keppra load in the ED -will continue with IV Keppra  -Resume antiepileptics in outpatient setting  Marijuana use -Potentially with cannabinoid hyperemesis syndrome however the patient had abstained from [facility] for about 2 months in the past without resolution of symptoms and has not smoked for 2 weeks so not sure if this is the etiology -Encourage cessation  Suicidal statement -Was found to make suicidal comments in the endoscopy suite and had a suicidal text on his phone -EGD was canceled and patient urged to return to the ward and he has made 1-1 safety sitter immediately and had suicide precautions enacted as well as psychiatric consultation urgently requested -Patient became extremely belligerent and patient was go to be involuntary committed an IVC paper was to be started however patient continued be extremely belligerent and agitated and became verbally abusive to the staff and security as well as [employer] had to be called. -Patient was assessed by the psychiatric nurse practitioner who felt that he  just made the statement and patient's wife was able to provide collateral and states that he never been suicidal in his life -Psychiatric evaluation deemed the patient not to be suicidal and patient was allowed to leave AMA and has left AMA now   Discharge Instructions   Allergies as of 08/28/2020   No Known Allergies     Medication List    STOP taking these medications   promethazine 25 MG tablet Commonly known as: PHENERGAN Replaced by: promethazine 25 MG suppository   sucralfate 1 GM/10ML suspension Commonly known as: Carafate     TAKE these medications   lamoTRIgine 100 MG tablet Commonly known as: LAMICTAL Take 1 tablet (100 mg total) by mouth daily. Take 3 tablets by mouth in the evening with meals   levETIRAcetam 500 MG tablet Commonly known as: KEPPRA Take 2 tabs PO Q a.m and 1 tab Po Q p.m What changed:   how much to take  how to take this  when to take this  additional instructions   ondansetron 4 MG disintegrating tablet Commonly known as: Zofran ODT  ODT q4 hours prn nausea/vomit What changed:   how  much to take  how to take this  when to take this  reasons to take this  additional instructions   promethazine 25 MG suppository Commonly known as: PHENERGAN Place 1 suppository (25 mg total) rectally every 6 (six) hours as needed for nausea or vomiting. Replaces: promethazine 25 MG tablet       Follow-up Information    [name], MD. Call today.   Specialty: Internal Medicine Why: discuss your visit, see when they want to see you in the office Contact information: [address] [place] [address] [phone]        Armbruster, Willaim Rayas, MD. Call today.   Specialty: Gastroenterology Why: discuss your visit, see when they want to see you in the office Contact information: [address] [place] [address] [phone]              No Known  Allergies  Consultations:  Gastroenterology  Psychiatry  Procedures/Studies: No results found.   Subjective: Seen and examined this morning prior to his EGD and then it was canceled because of his suicidal statement down in the endoscopy suite.  He became extremely belligerent and agitated and he has made one-to-one sitter and placed on suicide precautions.  Psychiatric consultation was requested and he is going to be IVC it however psych evaluated and deemed the patient to be stable and did not feel that he needed to be IVC inpatient because of his belligerence left AMA very upset.  He will need to follow-up with his PCP, psychiatry as well as gastroenterology in outpatient setting.  Discharge Exam: Vitals:   08/28/20 0036 08/28/20 0400  BP: 117/74 114/79  Pulse: (!) 56 (!) 59  Resp: 16 14  Temp: 97.6 F (36.4 C) 98.4 F (36.9 C)  SpO2: 100% 97%   Vitals:   08/27/20 1557 08/27/20 2018 08/28/20 0036 08/28/20 0400  BP: (!) 145/77 119/77 117/74 114/79  Pulse: 63 72 (!) 56 (!) 59  Resp: 17 18 16 14   Temp: 98.7 F (37.1 C) 98.7 F (37.1 C) 97.6 F (36.4 C) 98.4 F (36.9 C)  TempSrc: Oral Oral Oral Oral  SpO2: 100% 100% 100% 97%   Physical Exam this AM General: Pt is alert, awake, not in acute distress Cardiovascular: RRR, S1/S2 +, no rubs, no gallops Respiratory: Diminished bilaterally, no wheezing, no rhonchi Abdominal: Soft, NT, ND, bowel sounds + Extremities: no edema, no cyanosis  The results of significant diagnostics from this hospitalization (including imaging, microbiology, ancillary and laboratory) are listed below for reference.    Microbiology: Recent Results (from the past 240 hour(s))  Resp Panel by RT-PCR (Flu A&B, Covid) Nasopharyngeal Swab     Status: None   Collection Time: 08/27/20  1:47 PM   Specimen: Nasopharyngeal Swab; Nasopharyngeal(NP) swabs in vial transport medium  Result Value Ref Range Status   SARS Coronavirus 2 by RT PCR NEGATIVE NEGATIVE  Final    Comment: (NOTE) SARS-CoV-2 target nucleic acids are NOT DETECTED.  The SARS-CoV-2 RNA is generally detectable in upper respiratory specimens during the acute phase of infection. The lowest concentration of SARS-CoV-2 viral copies this assay can detect is 138 copies/mL. A negative result does not preclude SARS-Cov-2 infection and should not be used as the sole basis for treatment or other patient management decisions. A negative result may occur with  improper specimen collection/handling, submission of specimen other than nasopharyngeal swab, presence of viral mutation(s) within the areas targeted by this assay, and inadequate number  of viral copies(<138 copies/mL). A negative result must be combined with clinical observations, patient history, and epidemiological information. The expected result is Negative.  Fact Sheet for Patients:  BloggerCourse.com  Fact Sheet for Healthcare Providers:  SeriousBroker.it  This test is no t yet approved or cleared by the [place] FDA and  has been authorized for detection and/or diagnosis of SARS-CoV-2 by FDA under an Emergency Use Authorization (EUA). This EUA will remain  in effect (meaning this test can be used) for the duration of the COVID-19 declaration under Section 564(b)(1) of the Act, 21 U.S.C.section 360bbb-3(b)(1), unless the authorization is terminated  or revoked sooner.       Influenza A by PCR NEGATIVE NEGATIVE Final   Influenza B by PCR NEGATIVE NEGATIVE Final    Comment: (NOTE) The Xpert Xpress SARS-CoV-2/FLU/RSV plus assay is intended as an aid in the diagnosis of influenza from Nasopharyngeal swab specimens and should not be used as a sole basis for treatment. Nasal washings and aspirates are unacceptable for Xpert Xpress SARS-CoV-2/FLU/RSV testing.  Fact Sheet for Patients: BloggerCourse.com  Fact Sheet for Healthcare  Providers: SeriousBroker.it  This test is not yet approved or cleared by the [place] FDA and has been authorized for detection and/or diagnosis of SARS-CoV-2 by FDA under an Emergency Use Authorization (EUA). This EUA will remain in effect (meaning this test can be used) for the duration of the COVID-19 declaration under Section 564(b)(1) of the Act, 21 U.S.C. section 360bbb-3(b)(1), unless the authorization is terminated or revoked.  Performed at [facility], [address], [place], [place] [address]     Labs: BNP (last 3 results) No results for input(s): BNP in the last 8760 hours. Basic Metabolic Panel: Recent Labs  Lab 08/27/20 1025 08/28/20 0625  NA 144 143  K 4.2 3.5  CL 106 109  CO2 27 26  GLUCOSE 140* 95  BUN 10 10  CREATININE 1.20 0.99  CALCIUM 10.3 9.2  MG 1.9  --    Liver Function Tests: Recent Labs  Lab 08/27/20 1025  AST 24  ALT 36  ALKPHOS 59  BILITOT 0.7  PROT 7.8  ALBUMIN 4.4   No results for input(s): LIPASE, AMYLASE in the last 168 hours. No results for input(s): AMMONIA in the last 168 hours. CBC: Recent Labs  Lab 08/27/20 1025 08/28/20 0625  WBC 8.2 8.1  NEUTROABS 7.2  --   HGB 15.6 14.2  HCT 46.7 43.1  MCV 95.1 96.4  PLT 342 282   Cardiac Enzymes: No results for input(s): CKTOTAL, CKMB, CKMBINDEX, TROPONINI in the last 168 hours. BNP: Invalid input(s): POCBNP CBG: Recent Labs  Lab 08/27/20 1024  GLUCAP 142*   D-Dimer No results for input(s): DDIMER in the last 72 hours. Hgb A1c No results for input(s): HGBA1C in the last 72 hours. Lipid Profile No results for input(s): CHOL, HDL, LDLCALC, TRIG, CHOLHDL, LDLDIRECT in the last 72 hours. Thyroid function studies No results for input(s): TSH, T4TOTAL, T3FREE, THYROIDAB in the last 72 hours.  Invalid input(s): FREET3 Anemia work up No results for input(s): VITAMINB12, FOLATE, FERRITIN, TIBC, IRON, RETICCTPCT in the last  72 hours. Urinalysis    Component Value Date/Time   COLORURINE YELLOW 02/07/2020 0939   APPEARANCEUR CLEAR 02/07/2020 0939   LABSPEC 1.023 02/07/2020 0939   PHURINE 7.0 02/07/2020 0939   GLUCOSEU NEGATIVE 02/07/2020 0939   HGBUR NEGATIVE 02/07/2020 0939   BILIRUBINUR NEGATIVE 02/07/2020 0939   KETONESUR 20 (A) 02/07/2020 1027  PROTEINUR NEGATIVE 02/07/2020 0939   UROBILINOGEN 1.0 01/27/2015 0907   NITRITE NEGATIVE 02/07/2020 0939   LEUKOCYTESUR NEGATIVE 02/07/2020 0939   Sepsis Labs Invalid input(s): PROCALCITONIN,  WBC,  LACTICIDVEN Microbiology Recent Results (from the past 240 hour(s))  Resp Panel by RT-PCR (Flu A&B, Covid) Nasopharyngeal Swab     Status: None   Collection Time: 08/27/20  1:47 PM   Specimen: Nasopharyngeal Swab; Nasopharyngeal(NP) swabs in vial transport medium  Result Value Ref Range Status   SARS Coronavirus 2 by RT PCR NEGATIVE NEGATIVE Final    Comment: (NOTE) SARS-CoV-2 target nucleic acids are NOT DETECTED.  The SARS-CoV-2 RNA is generally detectable in upper respiratory specimens during the acute phase of infection. The lowest concentration of SARS-CoV-2 viral copies this assay can detect is 138 copies/mL. A negative result does not preclude SARS-Cov-2 infection and should not be used as the sole basis for treatment or other patient management decisions. A negative result may occur with  improper specimen collection/handling, submission of specimen other than nasopharyngeal swab, presence of viral mutation(s) within the areas targeted by this assay, and inadequate number of viral copies(<138 copies/mL). A negative result must be combined with clinical observations, patient history, and epidemiological information. The expected result is Negative.  Fact Sheet for Patients:  BloggerCourse.com  Fact Sheet for Healthcare Providers:  SeriousBroker.it  This test is no t yet approved or cleared by  the [place] FDA and  has been authorized for detection and/or diagnosis of SARS-CoV-2 by FDA under an Emergency Use Authorization (EUA). This EUA will remain  in effect (meaning this test can be used) for the duration of the COVID-19 declaration under Section 564(b)(1) of the Act, 21 U.S.C.section 360bbb-3(b)(1), unless the authorization is terminated  or revoked sooner.       Influenza A by PCR NEGATIVE NEGATIVE Final   Influenza B by PCR NEGATIVE NEGATIVE Final    Comment: (NOTE) The Xpert Xpress SARS-CoV-2/FLU/RSV plus assay is intended as an aid in the diagnosis of influenza from Nasopharyngeal swab specimens and should not be used as a sole basis for treatment. Nasal washings and aspirates are unacceptable for Xpert Xpress SARS-CoV-2/FLU/RSV testing.  Fact Sheet for Patients: BloggerCourse.com  Fact Sheet for Healthcare Providers: SeriousBroker.it  This test is not yet approved or cleared by the [place] FDA and has been authorized for detection and/or diagnosis of SARS-CoV-2 by FDA under an Emergency Use Authorization (EUA). This EUA will remain in effect (meaning this test can be used) for the duration of the COVID-19 declaration under Section 564(b)(1) of the Act, 21 U.S.C. section 360bbb-3(b)(1), unless the authorization is terminated or revoked.  Performed at [facility], [address], [place], [place] [address]    Time coordinating discharge: 35 minutes  SIGNED:  Merlene Laughter, DO [facility] 08/28/2020, 11:19 AM Pager is on AMION  If 7PM-7AM, please contact night-coverage [email or web address]

## 2020-08-29 ENCOUNTER — Emergency Department (HOSPITAL_COMMUNITY)
Admission: EM | Admit: 2020-08-29 | Discharge: 2020-08-31 | Disposition: A | Discharge status: Home / Self Care | Payer: Self-pay | Attending: Emergency Medicine | Admitting: Emergency Medicine

## 2020-08-29 ENCOUNTER — Telehealth: Payer: Self-pay

## 2020-08-29 ENCOUNTER — Other Ambulatory Visit: Payer: Self-pay

## 2020-08-29 ENCOUNTER — Emergency Department (HOSPITAL_COMMUNITY)
Admission: EM | Admit: 2020-08-29 | Discharge: 2020-08-29 | Disposition: A | Discharge status: Home / Self Care | Payer: Self-pay | Attending: Emergency Medicine | Admitting: Emergency Medicine

## 2020-08-29 ENCOUNTER — Encounter (HOSPITAL_COMMUNITY): Payer: Self-pay | Admitting: Emergency Medicine

## 2020-08-29 ENCOUNTER — Ambulatory Visit (HOSPITAL_COMMUNITY)
Admission: EM | Admit: 2020-08-29 | Discharge: 2020-08-29 | Payer: No Payment, Other | Attending: Psychiatry | Admitting: Psychiatry

## 2020-08-29 DIAGNOSIS — R569 Unspecified convulsions: Secondary | ICD-10-CM | POA: Insufficient documentation

## 2020-08-29 DIAGNOSIS — R451 Restlessness and agitation: Secondary | ICD-10-CM | POA: Insufficient documentation

## 2020-08-29 DIAGNOSIS — R1033 Periumbilical pain: Secondary | ICD-10-CM | POA: Insufficient documentation

## 2020-08-29 DIAGNOSIS — R45851 Suicidal ideations: Secondary | ICD-10-CM

## 2020-08-29 DIAGNOSIS — Z5321 Procedure and treatment not carried out due to patient leaving prior to being seen by health care provider: Secondary | ICD-10-CM | POA: Insufficient documentation

## 2020-08-29 DIAGNOSIS — F1994 Other psychoactive substance use, unspecified with psychoactive substance-induced mood disorder: Secondary | ICD-10-CM | POA: Diagnosis not present

## 2020-08-29 DIAGNOSIS — F17210 Nicotine dependence, cigarettes, uncomplicated: Secondary | ICD-10-CM | POA: Insufficient documentation

## 2020-08-29 DIAGNOSIS — R4689 Other symptoms and signs involving appearance and behavior: Secondary | ICD-10-CM

## 2020-08-29 DIAGNOSIS — F1910 Other psychoactive substance abuse, uncomplicated: Secondary | ICD-10-CM | POA: Insufficient documentation

## 2020-08-29 MED ORDER — HALOPERIDOL LACTATE 5 MG/ML IJ SOLN
5.0000 mg | Freq: Once | INTRAMUSCULAR | Status: AC
  Administered 2020-08-29: 5 mg via INTRAMUSCULAR

## 2020-08-29 MED ORDER — HALOPERIDOL LACTATE 5 MG/ML IJ SOLN
INTRAMUSCULAR | Status: AC
  Filled 2020-08-29: qty 1

## 2020-08-29 MED ORDER — ZIPRASIDONE MESYLATE 20 MG IM SOLR: 20.0000 mg | Freq: Once | INTRAMUSCULAR | Status: DC

## 2020-08-29 MED ORDER — DIPHENHYDRAMINE HCL 50 MG/ML IJ SOLN
INTRAMUSCULAR | Status: AC
  Filled 2020-08-29: qty 1

## 2020-08-29 MED ORDER — DIPHENHYDRAMINE HCL 50 MG/ML IJ SOLN
50.0000 mg | Freq: Once | INTRAMUSCULAR | Status: AC
  Administered 2020-08-29: 50 mg via INTRAMUSCULAR

## 2020-08-29 MED ORDER — LORAZEPAM 2 MG/ML IJ SOLN
2.0000 mg | Freq: Once | INTRAMUSCULAR | Status: AC
  Administered 2020-08-29: 2 mg via INTRAMUSCULAR

## 2020-08-29 MED ORDER — DIPHENHYDRAMINE HCL 50 MG/ML IJ SOLN: 50.0000 mg | Freq: Once | INTRAMUSCULAR | Status: DC

## 2020-08-29 MED ORDER — LORAZEPAM 2 MG/ML IJ SOLN
INTRAMUSCULAR | Status: AC
  Filled 2020-08-29: qty 1

## 2020-08-29 MED ORDER — LORAZEPAM 2 MG/ML IJ SOLN: 2.0000 mg | Freq: Once | INTRAMUSCULAR | Status: DC

## 2020-08-29 MED ORDER — HALOPERIDOL LACTATE 5 MG/ML IJ SOLN: 5.0000 mg | Freq: Once | INTRAMUSCULAR | Status: DC

## 2020-08-29 NOTE — ED Triage Notes (Signed)
Pt back to ED by EMS from [facility] following possible episode of seizure like activity. Pt was A+O with EMS, is combative and arrives in handcuffs under IVC papers. Patient is verbally and physically combative upon arrival. [employer] and security bedside.

## 2020-08-29 NOTE — ED Notes (Signed)
Pt returned via EMS and accompanied by [employer].

## 2020-08-29 NOTE — ED Provider Notes (Addendum)
Behavioral Health Urgent Care Medical Screening Exam  Patient Name: Greg Morales MRN: [id number] Date of Evaluation: 08/29/20 Chief Complaint:   Diagnosis:  Final diagnoses:  Substance induced mood disorder (HCC)    History of Present illness: Greg Morales is a 34 y.o. male.  Patient presents voluntarily to [facility] behavioral health center for walk-in assessment.  Patient asked that friend, Jon Gills, remain present for assessment.   Patient asks family friend, Jon Gills, to discuss crisis.  Alexis verbalizes that patient left the hospital last night and "went home and tore up the house, he also had a knife and took his wife's phone and keys when she came home."  Per Jon Gills, Patient's wife is afraid of him.  Jon Gills discusses that patient has been to [facility], [place], [facility] and [facility] and has not been adequately treated states "they always want to talk about it is because of the marijuana."  Patient and patient's friend reported that even when he does not use marijuana he continues to have chronic, burning abdominal pain.  Patient endorses weight loss and inability to be compliant with medications related to abdominal pain and vomiting.  Patient reports recent stressor is chronic abdominal pain.  Patient reports "I am going through a crisis and I need help, I feel like hurting people and myself."  Patient endorses suicidal ideations currently, does not disclose plan.  Patient reports last suicide attempt was last night, does not disclose any details.  Patient reports 2 prior suicide attempts in the past. Patient states "I feel like hurting anyone who is in my way, I feel like hurting everyone."  Patient endorses homicidal ideations, denies any plan or intent currently.  Patient assessed by nurse practitioner.  Patient alert and oriented.  Patient labile and irritable during assessment.  Patient yells out  during assessment "help me, help me then!"  Patient resides in [place] with his  wife.  Patient denies access to weapons.  Patient endorses substance use including cocaine, last use today, "X pills" last use today and marijuana last use today.  Patient denies alcohol use.  Patient denies both auditory and visual hallucinations currently.  Patient endorses auditory hallucinations "when I am using coke."  Patient reports he has been diagnosed with a mental illness in the past but is unable to recall diagnosis.  Patient reports he has been seen by [facility] where he took "4 medications but I do not know what they are."  Patient has not been seen by outpatient psychiatry "for years."  Patient offered support and encouragement.    Psychiatric Specialty Exam  Presentation  General Appearance:Appropriate for Environment;Casual  Eye Contact:Minimal  Speech:Clear and Coherent  Speech Volume:Increased  Handedness:Right   Mood and Affect  Mood:Anxious;Irritable;Labile  Affect:Labile;Tearful   Thought Process  Thought Processes:Coherent;Goal Directed  Descriptions of Associations:Intact  Orientation:Full (Time, Place and Person)  Thought Content:Logical  Hallucinations:None  Ideas of Reference:None  Suicidal Thoughts:Yes, Active With Intent  Homicidal Thoughts:Yes, Passive Without Intent   Sensorium  Memory:Immediate Fair;Recent Fair;Remote Fair  Judgment:Poor  Insight:Lacking   Executive Functions  Concentration:Fair  Attention Span:Fair  Recall:Fair  Fund of Knowledge:Fair  Language:Fair   Psychomotor Activity  Psychomotor Activity:Normal   Assets  Assets:Communication Skills;Desire for Improvement;Financial Resources/Insurance;Housing;Intimacy;Leisure Time;Social Support   Sleep  Sleep:Poor  Number of hours: No data recorded  Physical Exam: Physical Exam Vitals and nursing note reviewed.  Constitutional:      Appearance: Normal appearance. He is well-developed.  HENT:     Head: Normocephalic.  Cardiovascular:     Rate  and Rhythm: Normal rate.  Pulmonary:     Effort: Pulmonary effort is normal.  Neurological:     Mental Status: He is alert and oriented to person, place, and time.  Psychiatric:        Attention and Perception: Attention normal.        Mood and Affect: Mood is anxious. Affect is labile, angry and tearful.        Speech: Speech is rapid and pressured.        Behavior: Behavior is uncooperative and agitated.        Thought Content: Thought content includes homicidal and suicidal ideation.        Cognition and Memory: Cognition normal.        Judgment: Judgment is impulsive.    Review of Systems  Constitutional: Negative.   HENT: Negative.   Eyes: Negative.   Respiratory: Negative.   Cardiovascular: Negative.   Gastrointestinal: Negative.   Genitourinary: Negative.   Musculoskeletal: Negative.   Skin: Negative.   Neurological: Negative.   Endo/Heme/Allergies: Negative.   Psychiatric/Behavioral: Positive for substance abuse and suicidal ideas.   There were no vitals taken for this visit. There is no height or weight on file to calculate BMI.  Musculoskeletal: Strength & Muscle Tone: within normal limits Gait & Station: normal Patient leans: N/A   [facility] MSE Discharge Disposition for Follow up and Recommendations: Based on my evaluation I certify that psychiatric inpatient services furnished can reasonably be expected to improve the patient's condition which I recommend transfer to an appropriate accepting facility.    Medication recommendation: -Zyprexa 5 mg twice daily/mood  Patient involuntary commitment petition completed.  Patient will be transferred to nursing [facility] emergency department to await inpatient psychiatric placement.  Telephone report called to emergency department attending physician.     Patrcia Dolly, FNP 08/29/2020, 5:31 PM

## 2020-08-29 NOTE — ED Triage Notes (Signed)
Per EMS-left AMA yesterday-was suppose to have an endoscopy-was seen on Tuesday for the same-complaining of abdominal pain above naval-symptoms since May

## 2020-08-29 NOTE — ED Notes (Signed)
Patient's wife arrived to retrieve her phone and keys from locker. Per Inetta Fermo NP patient did give consent for her to retrieve belongings. Escorted to lockers, belongings obtained & wife escorted back to lobby. Wife had no contact with patient throughout.

## 2020-08-29 NOTE — BH Assessment (Addendum)
Received a call from spouse Dois Davenport [facility]) # [phone]. States that she was made aware that her spouse is in the waiting area at [facility]. She is aware that he left AMA yesterday and requesting a psych evaluation. Clinician unable to confirm or deny that patient is a patient at [facility] due to HIPPA. Spouse states the purpose of her call is to only  provide the following information.   1. "I feel he could hurt himself". States that when she walked in her home last night patient was sitting on the couch with a butcher knife beside him. Wife states that his behavior was bizarre.  2. He texted his spouse last night, "Make sure you keep your gun on you at all times because you will never be at peace...Marland KitchenMarland KitchenI promise you".  3. Increased aggressive behaviors such as rumbling in spouse's purse and stealing her work Dietitian.  4. Spouse states that he is suppose to be on mental health medications but non compliant (Lamictal and Zyprexa)  5. Spouse suspects drug use such as: cocaine.

## 2020-08-29 NOTE — ED Triage Notes (Signed)
Patient expresses SI due to not being able to eat foods, or hold anything down due to GI problems and needing an (esophagastroduodenoscopy) procedure to be completed. Patient very tearful, when offered tissue, patient smacked tissue out of RN hand. RN informed patient disrespect will not be tolerated. Patient needing a esophagastroduodenoscopy but could not preform at [facility] due to SI.  Patient states an IVC was taking out at [facility] long, but does not think it was followed through because he left.Patient is SI due to pain from GI problems.

## 2020-08-29 NOTE — ED Notes (Signed)
Attempted to collect labs. Pt remains agitated and refuses to let staff obtain bloodwork at this time.

## 2020-08-29 NOTE — Telephone Encounter (Signed)
Transition Care Management Follow-up Telephone Call Date of discharge and from where: 08/28/2020, [facility]  - left AMA  Currently back in ED

## 2020-08-29 NOTE — BH Assessment (Signed)
Comprehensive Clinical Assessment (CCA) Note  08/29/2020 Rober MinionWilliam A Ramiro [id number]  Eston EstersWilliam Lipsitz is a 34 year old male [facility] accompanied by his friend Jon Gillslexis with chief complaint of pain, SI and HI. Patient presents labile while in lobby area and continues to show signs of agitation when clinician and NP entered the room. Patient reports having stomach issues since May which has caused him to have SI/HI.  Patient states that he wants to kill himself and anyone that gets in his way. Patient was [facility] but was unable to complete it due to drug usage and reports that he wants to harm himself. Patient friend reports that he was supposed to be IVC'd yesterday at hospital but they let patient go home. Notes from hospital reports patient was labile, verbally aggressive and threatening to staff and self. IVC was going to be initiated but patient wife felt that patient could return home safely. Friend states when patient got home he "tore up the houses" and took his wife work keys was very aggressive and making SI/HI statements. Friend states that patient wife is "terrified" of him as well as his kids. Friend reports that patient has difficulty eating and reports that patient says his stomach is on fire and it feels like he is dying. Friend reports that patient has lost 130 lbs. since start of medical issues.    When asked about his substance use patient reports using marijuana, cocaine and ecstasy pills today. Patient reports "I don't know" when asked about mental health services and family history of mental illness but friend reports that patient [facility]. Patient acknowledges taking medications [facility]. Patient screams repeatedly "help me". Patient reassured that help will be provided.   Patient is labile, agitated and has poor eye contact during assessment. Patient is slumped, guarded, tearful and monosyllabic.  Patient reports current SI with two prior attempts and one being yesterday. Patient does not give detail about how he tried to kill himself. Patient reports HI no identifiable person. Patient acknowledges AVH at times and denies SIB. Patient does not have outpatient services and he has never had inpatient treatment. Patient denies current legal issues and he is not working but use to work for [employer].   Per Berneice Heinrichina Tate, NP, patient is recommended for inpatient treatment.        Chief Complaint:  Chief Complaint  Patient presents with  . Suicidal   Visit Diagnosis: Substance induced mood disorder (HCC)    CCA Screening, Triage and Referral (STR)  Patient Reported Information How did you hear about us? Self (Phreesia 08/29/2020)  Referral name: NA (Phreesia 08/29/2020)  Referral phone number: No data recorded  Whom do you see for routine medical problems? Primary Care (Phreesia 08/29/2020)  Practice/Facility Name: [facility] (Phreesia 08/29/2020)  Practice/Facility Phone Number: No data recorded Name of Contact: Takela (Phreesia 08/29/2020)  Contact Number: [phone] (Phreesia 08/29/2020)  Contact Fax Number: [phone] (Phreesia 08/29/2020)  Prescriber Name: Dr. Dareen PianoAnderson (Phreesia 08/29/2020)  Prescriber Address (if known): No data recorded  What Is the Reason for Your Visit/Call Today? Mental and Medical Issues Going On (Phreesia 08/29/2020)  How Long Has This Been Causing You Problems? > than 6 months (Phreesia 08/29/2020)  What Do You Feel Would Help You the Most Today? Other (Comment) (Phreesia 08/29/2020)   Have You Recently Been in Any Inpatient Treatment (Hospital/Detox/Crisis Center/28-Day Program)? No (Phreesia 08/29/2020)  Name/Location of  Program/Hospital:No data recorded How Long Were You There? No data recorded When Were You Discharged? No data recorded  Have You Ever Received Services From [facility] Before? No (Phreesia  08/29/2020)  Who Do You See at [facility]? No data recorded  Have You Recently Had Any Thoughts About Hurting Yourself? Yes (Phreesia 08/29/2020)  Are You Planning to Commit Suicide/Harm Yourself At This time? Yes (Phreesia 08/29/2020)   Have you Recently Had Thoughts About Hurting Someone Karolee Ohs? Yes (Phreesia 08/29/2020)  Explanation: planning On Hurting anyone that gets in my way and thats not trying to help me. (Phreesia 08/29/2020)   Have You Used Any Alcohol or Drugs in the Past 24 Hours? Yes (Phreesia 08/29/2020)  How Long Ago Did You Use Drugs or Alcohol? No data recorded What Did You Use and How Much? weed   cocaine and X (Phreesia 08/29/2020)   Do You Currently Have a Therapist/Psychiatrist? No (Phreesia 08/29/2020)  Name of Therapist/Psychiatrist: No data recorded  Have You Been Recently Discharged From Any Office Practice or Programs? No (Phreesia 08/29/2020)  Explanation of Discharge From Practice/Program: No data recorded    CCA Screening Triage Referral Assessment Type of Contact: No data recorded Is this Initial or Reassessment? No data recorded Date Telepsych consult ordered in [facility]:  No data recorded Time Telepsych consult ordered in [facility]:  No data recorded  Patient Reported Information Reviewed? No data recorded Patient Left Without Being Seen? No data recorded Reason for Not Completing Assessment: No data recorded  Collateral Involvement: No data recorded  Does Patient Have a Court Appointed Legal Guardian? No data recorded Name and Contact of Legal Guardian: No data recorded If Minor and Not Living with Parent(s), Who has Custody? No data recorded Is CPS involved or ever been involved? No data recorded Is APS involved or ever been involved? No data recorded  Patient Determined To Be At Risk for Harm To Self or Others Based on Review of Patient Reported Information or Presenting Complaint? No data recorded Method: No data recorded Availability of  Means: No data recorded Intent: No data recorded Notification Required: No data recorded Additional Information for Danger to Others Potential: No data recorded Additional Comments for Danger to Others Potential: No data recorded Are There Guns or Other Weapons in Your Home? No data recorded Types of Guns/Weapons: No data recorded Are These Weapons Safely Secured?                            No data recorded Who Could Verify You Are Able To Have These Secured: No data recorded Do You Have any Outstanding Charges, Pending Court Dates, Parole/Probation? No data recorded Contacted To Inform of Risk of Harm To Self or Others: No data recorded  Location of Assessment: No data recorded  Does Patient Present under Involuntary Commitment? No data recorded IVC Papers Initial File Date: No data recorded  [place] of Residence: No data recorded  Patient Currently Receiving the Following Services: No data recorded  Determination of Need: No data recorded  Options For Referral: No data recorded    CCA Biopsychosocial Intake/Chief Complaint:  SI/HI, pain  Current Symptoms/Problems: Pain, SI/HI, AVH, substance use   Patient Reported Schizophrenia/Schizoaffective Diagnosis in Past: No data recorded  Strengths: UTA  Preferences: UTA  Abilities: UTA   Type of Services Patient Feels are Needed: No data recorded  Initial Clinical Notes/Concerns: No data recorded  Mental Health Symptoms Depression:  Irritability;Sleep (too much or little);Tearfulness;Weight  gain/loss;Hopelessness   Duration of Depressive symptoms: Greater than two weeks   Mania:  None   Anxiety:   Worrying;Tension;Sleep;Irritability   Psychosis:  No data recorded  Duration of Psychotic symptoms: No data recorded  Trauma:  None   Obsessions:  None   Compulsions:  None   Inattention:  None   Hyperactivity/Impulsivity:  N/A   Oppositional/Defiant Behaviors:  No data recorded  Emotional Irregularity:   Intense/inappropriate anger;Mood lability;Recurrent suicidal behaviors/gestures/threats   Other Mood/Personality Symptoms:  No data recorded   Mental Status Exam Appearance and self-care  Stature:  Average   Weight:  Average weight   Clothing:  Age-appropriate   Grooming:  Normal   Cosmetic use:  None   Posture/gait:  Slumped;Tense   Motor activity:  Agitated   Sensorium  Attention:  Distractible   Concentration:  Scattered   Orientation:  Person;Place   Recall/memory:  Defective in Remote   Affect and Mood  Affect:  Labile;Tearful   Mood:  Irritable;Angry   Relating  Eye contact:  Avoided   Facial expression:  Tense;Angry;Sad   Attitude toward examiner:  Defensive;Guarded;Hostile;Irritable   Thought and Language  Speech flow: Loud   Thought content:  No data recorded  Preoccupation:  Somatic;Suicide;Homicidal   Hallucinations:  No data recorded  Organization:  No data recorded  [employer] of Knowledge:  Impoverished by (Comment)   Intelligence:  Average   Abstraction:  No data recorded  Judgement:  Dangerous   Reality Testing:  No data recorded  Insight:  No data recorded  Decision Making:  No data recorded  Social Functioning  Social Maturity:  Impulsive   Social Judgement:  Heedless   Stress  Stressors:  Illness   Coping Ability:  Overwhelmed;Exhausted   Skill Deficits:  Self-control   Supports:  Family     Religion:    Leisure/Recreation:    Exercise/Diet: Exercise/Diet Have You Gained or Lost A Significant Amount of Weight in the Past Six Months?: Yes-Lost Number of Pounds Lost?: 130 Do You Have Any Trouble Sleeping?: Yes   CCA Employment/Education Employment/Work Situation: Employment / Work Situation Employment situation: Unemployed Patient's job has been impacted by current illness: Yes  Education:     CCA Family/Childhood History Family and Relationship History: Family history Marital status:  Married What types of issues is patient dealing with in the relationship?: Wife apparently is afraid of pt behaviors Does patient have children?: Yes How many children?: 2 How is patient's relationship with their children?: children afraid of pt behaviors  Childhood History:  Childhood History Additional childhood history information: UTA Description of patient's relationship with caregiver when they were a child: UTA Patient's description of current relationship with people who raised him/her: UTA How were you disciplined when you got in trouble as a child/adolescent?: UTA  Child/Adolescent Assessment:     CCA Substance Use Alcohol/Drug Use: Alcohol / Drug Use Pain Medications: See MAR Prescriptions: See MAR Over the Counter: See MAR History of alcohol / drug use?: Yes Substance #1 Name of Substance 1: Marijuana 1 - Last Use / Amount: 08/29/20 Substance #2 Name of Substance 2: ecstasy 2 - Last Use / Amount: 08/29/20 Substance #3 Name of Substance 3: Cocaine 3 - Last Use / Amount: 08/29/20                   ASAM's:  Six Dimensions of Multidimensional Assessment  Dimension 1:  Acute Intoxication and/or Withdrawal Potential:      Dimension 2:  Biomedical Conditions and Complications:      Dimension 3:  Emotional, Behavioral, or Cognitive Conditions and Complications:     Dimension 4:  Readiness to Change:     Dimension 5:  Relapse, Continued use, or Continued Problem Potential:     Dimension 6:  Recovery/Living Environment:     ASAM Severity Score:    ASAM Recommended Level of Treatment:     Substance use Disorder (SUD)    Recommendations for Services/Supports/Treatments:    DSM5 Diagnoses: Patient Active Problem List   Diagnosis Date Noted  . Substance induced mood disorder (HCC)   . Nausea and vomiting 08/27/2020  . Loss of weight 08/27/2020  . Intractable nausea and vomiting 08/27/2020  . Marijuana abuse 08/27/2020  . Seizures (HCC) 08/27/2020  .  Lung nodule < 6cm on CT 07/27/2019  . 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 Tobacco dependence 05/24/2018  . History of bipolar disorder 05/24/2018  . Elevated blood lead level 05/24/2018    Per Berneice Heinrich, NP, patient is recommended for inpatient treatment.    Zafiro Routson Shirlee More, [facility]

## 2020-08-29 NOTE — Progress Notes (Signed)
I was alerted by the [facility] long endoscopy staff that the patient was in the endoscopy preprocedure area and did not want to undergo any further procedures.  He was scared.  He also stated that he wanted to hurt himself and did not feel comfortable with any procedures.  As a result after hearing this the patient's procedure was canceled.  The patient was to be escorted back up to his room however this required security.  As a result of the issues that were occurring the patient was to be evaluated by psychiatry to help [place] understand his risks and we were to postpone his EGD.  I found out later in the day that he left AMA.  Hopefully, the patient is able to be stabilized from a mental health perspective or if he returns to the hospital and is deemed to be a nonharm to himself or to others, then we can pursue a diagnostic endoscopy as had been the plan.  Any further procedures must be done with [facility].  Justice Britain, MD [facility] Office # [phone]

## 2020-08-29 NOTE — ED Notes (Signed)
Per report patient being IVCd. This RN had no contact with patient while at [facility]. Patient heard yelling when police arrived but removed from facility without issue.

## 2020-08-29 NOTE — ED Notes (Addendum)
Attempted to round on pt and introduce self to pt. Pt. Called this Clinical research associate, "a Writer and cracker," "I want my phone so I can call my wife." [employer] at bedside.

## 2020-08-29 NOTE — ED Provider Notes (Signed)
[facility]-EMERGENCY DEPT Provider Note   CSN: [id number] Arrival date & time: 08/29/20  2115     History Chief Complaint  Patient presents with  . Psychiatric Evaluation  . Seizures    BRANDT CHANEY is a 34 y.o. male hx of seizure, polysubstance abuse, here with seizure. Patient was recently admitted for seizure and was given Keppra while in the hospital.  He was discharged from the hospital to behavioral health and apparently was sent home.  Patient came in the ED earlier and was left without being seen and then went to behavioral health urgent care.  Per the note narrative, patient was sent home with outpatient resources but apparently patient became agitated right after the note and he was IVC by behavioral health.  Patient then had a possible seizure episode before EMS got there.  When EMS got there he was agitated and cursing.  He refused to answer questions and states that he wants to leave and then complained that he is here all day and nobody is caring for him.  Per the IVC paper, patient admitted to thoughts of harming himself.  He would not elaborate on that.  He had previous suicidal attempts.   The history is provided by the patient.       Past Medical History:  Diagnosis Date  . Chronic abdominal pain   . Medically noncompliant   . Polysubstance abuse (HCC)   . Seizures (HCC)    since childhood    Patient Active Problem List   Diagnosis Date Noted  . Substance induced mood disorder (HCC)   . Nausea and vomiting 08/27/2020  . Loss of weight 08/27/2020  . Intractable nausea and vomiting 08/27/2020  . Marijuana abuse 08/27/2020  . Seizures (HCC) 08/27/2020  . Lung nodule < 6cm on CT 07/27/2019  . 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 Tobacco dependence 05/24/2018  . History of bipolar disorder 05/24/2018  . Elevated blood lead level 05/24/2018    Past Surgical History:  Procedure Laterality Date  . ESOPHAGOGASTRODUODENOSCOPY          Family History  Problem Relation Age of Onset  . Hypertension Other   . Seizures Other   . Seizures Maternal Grandmother     Social History   Tobacco Use  . Smoking status: Current Every Day Smoker    Packs/day: 1.00    Years: 5.00    Pack years: 5.00    Types: Cigarettes  . Smokeless tobacco: Never Used  Vaping Use  . Vaping Use: Never used  Substance Use Topics  . Alcohol use: Not Currently  . Drug use: Not Currently    Types: Marijuana    Home Medications Prior to Admission medications   Medication Sig Start Date End Date Taking? Authorizing Provider  levETIRAcetam (KEPPRA) 500 MG tablet Take 2 tabs PO Q a.m and 1 tab Po Q p.m Patient taking differently: Take 500-1,000 mg by mouth 2 (two) times daily. Take 2 tabs (1000mg ) PO Q a.m and 1 tab (500mg ) Po Q p.m 07/31/19  Yes , MD  ondansetron (ZOFRAN ODT) 4 MG disintegrating tablet 4mg  ODT q4 hours prn nausea/vomit Patient taking differently: Take 4 mg by mouth every 4 (four) hours as needed for nausea or vomiting.  08/27/20  Yes [name], DO  promethazine (PHENERGAN) 25 MG suppository Place 1 suppository (25 mg total) rectally every 6 (six) hours as needed for nausea or vomiting. 08/27/20  Yes 08/29/20, DO  lamoTRIgine (  LAMICTAL) 100 MG tablet Take 1 tablet (100 mg total) by mouth daily. Take 3 tablets by mouth in the evening with meals Patient not taking: Reported on 08/29/2020 05/24/18   [name], MD    Allergies    Patient has no known allergies.  Review of Systems   Review of Systems  Neurological: Positive for seizures.  Psychiatric/Behavioral: Positive for agitation and suicidal ideas.  All other systems reviewed and are negative.   Physical Exam Updated Vital Signs BP 104/73   Pulse 80   Ht 5\' 5"  (1.651 m)   Wt 62.1 kg   SpO2 96%   BMI 22.78 kg/m   Physical Exam Vitals and nursing note reviewed.  Constitutional:      Comments: Agitated, cursing   HENT:      Head: Normocephalic.     Nose: Nose normal.     Mouth/Throat:     Mouth: Mucous membranes are moist.  Eyes:     Extraocular Movements: Extraocular movements intact.     Pupils: Pupils are equal, round, and reactive to light.  Cardiovascular:     Rate and Rhythm: Normal rate and regular rhythm.     Pulses: Normal pulses.     Heart sounds: Normal heart sounds.  Pulmonary:     Effort: Pulmonary effort is normal.     Breath sounds: Normal breath sounds.  Abdominal:     General: Abdomen is flat.     Palpations: Abdomen is soft.  Musculoskeletal:        General: Normal range of motion.     Cervical back: Normal range of motion and neck supple.  Skin:    General: Skin is warm.     Capillary Refill: Capillary refill takes less than 2 seconds.  Neurological:     Comments: Agitated and moving all extremities  Psychiatric:     Comments: Agitated and poor judgment     ED Results / Procedures / Treatments   Labs (all labs ordered are listed, but only abnormal results are displayed) Labs Reviewed  CBC WITH DIFFERENTIAL/PLATELET  COMPREHENSIVE METABOLIC PANEL  ETHANOL  SALICYLATE LEVEL  ACETAMINOPHEN LEVEL  RAPID URINE DRUG SCREEN, HOSP PERFORMED    EKG None  Radiology No results found.  Procedures Procedures (including critical care time)  Medications Ordered in ED Medications  haloperidol lactate (HALDOL) 5 MG/ML injection (has no administration in time range)  LORazepam (ATIVAN) 2 MG/ML injection (has no administration in time range)  diphenhydrAMINE (BENADRYL) 50 MG/ML injection (has no administration in time range)  LORazepam (ATIVAN) injection 2 mg (has no administration in time range)  LORazepam (ATIVAN) injection 2 mg (2 mg Intramuscular Given 08/29/20 2124)  diphenhydrAMINE (BENADRYL) injection 50 mg (50 mg Intramuscular Given 08/29/20 2125)  haloperidol lactate (HALDOL) injection 5 mg (5 mg Intramuscular Given 08/29/20 2124)    ED Course  I have reviewed the  triage vital signs and the nursing notes.  Pertinent labs & imaging results that were available during my care of the patient were reviewed by me and considered in my medical decision making (see chart for details).    MDM Rules/Calculators/A&P                         ARISTOTLE LIEB is a 34 y.o. male history of seizures and here with agitation.  Patient has a history of seizure and was just admitted hospital and has been given his seizure meds.  It is unclear if  he had a true seizure prior to arrival or not.  He is agitated and was IVC by behavioral health.  Will repeat psych clearance labs and consult TTS.  He will need psych admission.  12:04 AM Labs pending. If labs unremarkable, patient can be medically cleared. Signed out to Dr. Eudelia Bunch in the ED.    Final Clinical Impression(s) / ED Diagnoses Final diagnoses:  None    Rx / DC Orders ED Discharge Orders    None       Charlynne Pander, MD 08/30/20 0130

## 2020-08-30 LAB — RAPID URINE DRUG SCREEN, HOSP PERFORMED
Amphetamines: POSITIVE — AB
Barbiturates: NOT DETECTED
Benzodiazepines: POSITIVE — AB
Cocaine: POSITIVE — AB
Opiates: NOT DETECTED
Tetrahydrocannabinol: POSITIVE — AB

## 2020-08-30 LAB — CBC WITH DIFFERENTIAL/PLATELET
Abs Immature Granulocytes: 0.03 10*3/uL (ref 0.00–0.07)
Basophils Absolute: 0.1 10*3/uL (ref 0.0–0.1)
Basophils Relative: 1 %
Eosinophils Absolute: 0.1 10*3/uL (ref 0.0–0.5)
Eosinophils Relative: 1 %
HCT: 43.9 % (ref 39.0–52.0)
Hemoglobin: 14.3 g/dL (ref 13.0–17.0)
Immature Granulocytes: 0 %
Lymphocytes Relative: 33 %
Lymphs Abs: 2.9 10*3/uL (ref 0.7–4.0)
MCH: 32.1 pg (ref 26.0–34.0)
MCHC: 32.6 g/dL (ref 30.0–36.0)
MCV: 98.7 fL (ref 80.0–100.0)
Monocytes Absolute: 0.6 10*3/uL (ref 0.1–1.0)
Monocytes Relative: 6 %
Neutro Abs: 5.2 10*3/uL (ref 1.7–7.7)
Neutrophils Relative %: 59 %
Platelets: 281 10*3/uL (ref 150–400)
RBC: 4.45 MIL/uL (ref 4.22–5.81)
RDW: 14 % (ref 11.5–15.5)
WBC: 8.7 10*3/uL (ref 4.0–10.5)
nRBC: 0 % (ref 0.0–0.2)

## 2020-08-30 LAB — COMPREHENSIVE METABOLIC PANEL
ALT: 24 U/L (ref 0–44)
AST: 28 U/L (ref 15–41)
Albumin: 3.7 g/dL (ref 3.5–5.0)
Alkaline Phosphatase: 45 U/L (ref 38–126)
Anion gap: 12 (ref 5–15)
BUN: 9 mg/dL (ref 6–20)
CO2: 22 mmol/L (ref 22–32)
Calcium: 9.2 mg/dL (ref 8.9–10.3)
Chloride: 108 mmol/L (ref 98–111)
Creatinine, Ser: 1.08 mg/dL (ref 0.61–1.24)
GFR, Estimated: >60 mL/min (ref >60)
Glucose, Bld: 71 mg/dL (ref 70–99)
Potassium: 3.6 mmol/L (ref 3.5–5.1)
Sodium: 142 mmol/L (ref 135–145)
Total Bilirubin: 0.8 mg/dL (ref 0.3–1.2)
Total Protein: 6.7 g/dL (ref 6.5–8.1)

## 2020-08-30 LAB — SALICYLATE LEVEL: Salicylate Lvl: <7 mg/dL — ABNORMAL LOW (ref 7.0–30.0)

## 2020-08-30 LAB — ETHANOL: Alcohol, Ethyl (B): <10 mg/dL (ref <10)

## 2020-08-30 LAB — ACETAMINOPHEN LEVEL: Acetaminophen (Tylenol), Serum: <10 ug/mL — ABNORMAL LOW (ref 10–30)

## 2020-08-30 MED ORDER — LORAZEPAM 2 MG/ML IJ SOLN
2.0000 mg | Freq: Once | INTRAMUSCULAR | Status: AC
  Administered 2020-08-30: 2 mg via INTRAMUSCULAR
  Filled 2020-08-30: qty 1

## 2020-08-30 NOTE — ED Notes (Signed)
Left leg released from restraint at this time, [employer] at bedside to assist this RN. Pt resting comfortably, will continue to monitor.

## 2020-08-30 NOTE — ED Notes (Signed)
Pt became violent and threatening towards staff earlier this morning per night shift RN. Reportedly patient was yelling, kicking and spitting on staff members.

## 2020-08-30 NOTE — ED Notes (Signed)
Pt is now shouting a crying while laying in bed

## 2020-08-30 NOTE — ED Notes (Signed)
Pt refusing vital sign check

## 2020-08-30 NOTE — ED Notes (Signed)
Pt still refusing vital sign check. Pt states he wants his wife to drop him off boxers. PD monitoring from outside room

## 2020-08-30 NOTE — ED Provider Notes (Signed)
I assumed care of this patient.  Please see previous provider note for further details of Hx, PE.  Briefly patient is a 34 y.o. male who presented for agitation and possible seizure. Required medical sedation due to aggression.  Plan to follow up labs. If reassuring, he would medically cleared for [facility] dispo.  Labs reassuring. Medically cleared.      Nira Conn, MD 08/30/20 [phone]

## 2020-08-30 NOTE — ED Notes (Signed)
Pt sleeping comfortably at this time, VSS, NADN, restraints remain off but at bedside, [employer] still present at this time due to patients unpredictable aggressive behaviors, will continue to monitor.

## 2020-08-30 NOTE — BH Assessment (Signed)
[place] Assessment Progress Note This Probation officer met with patient this date to assess current mental health status. Patient will not answer any of this writer's questions and states "if I don't get out of here someone will get hurt." This writer attempted to obtain patient's history in reference to what occurred prior to arrival as patient was observed to become agitated and is demanding to leave. As this Probation officer attempted to explain that patient was with a IVC patient stated "none of that has anything to do with me and people are going to pay." Patient will not elaborate on content of statement. Per note review patient has been refusing vitals and has been observed to be aggressive with staff. Case was staffed with Hall Busing NP who recommended a continued inpatient admission for stabilization.

## 2020-08-30 NOTE — ED Notes (Signed)
Dr Silverio Lay Bedside, verbal order given for 4 point violent restraints. Security, [employer] and EMS bedside to assist with implementation.

## 2020-08-30 NOTE — ED Notes (Signed)
Attempted to round on pt. And pt. Spit on this Clinical research associate.

## 2020-08-30 NOTE — ED Notes (Signed)
Pt allowed this nurse to get vitals. Pt asking for juice and something to eat. Pt questioning plan, and this nurse informed him of his IVC status

## 2020-08-30 NOTE — ED Notes (Signed)
Right food restraint removed at this time. [employer] remains bedside to assist this RN, pt is cooperative at this time, endorses being tired, will continue to monitor.

## 2020-08-30 NOTE — ED Notes (Signed)
Left wrist restraint removed at this time, pt remains cooperative throughout, [employer] is bedside at this time to assist. Pt resting comfortably, VSS, NADN, will continue to monitor.

## 2020-08-30 NOTE — ED Notes (Signed)
PD at bedside.

## 2020-08-30 NOTE — ED Notes (Signed)
Pt changed into purple scrubs with assistance of PD.

## 2020-08-30 NOTE — ED Notes (Signed)
Pt ambulated to restroom without assistance. Pt states he would like his stuff back and to leave. Pt informed that he is under IVC and can have a phone call at 10am. Pt refusing vitals. Pt back in bed with covers pulled over his face. PD at bedside and door left open

## 2020-08-30 NOTE — ED Notes (Signed)
Right wrist restraint removed at this time. [employer] bedside to assist, pt is resting comfortably, will continue to monitor.

## 2020-08-30 NOTE — ED Notes (Signed)
Pt tolerated food and fluid with no c/o abdominal pain, N/V. Pt asked for more apple juice

## 2020-08-30 NOTE — ED Notes (Signed)
[facility] called. They state that patient was never admitted to them and will need psych eval here in the ED before they can take him back as in patient

## 2020-08-30 NOTE — BH Assessment (Signed)
[facility] Assessment Progress Note  Per Berneice Heinrich, NP, this pt requires psychiatric hospitalization.  Pt presents under IVC initiated by Inetta Fermo.  Pt has been declined by [facility] due to financial criteria.  Referral information has been faxed to [facility].  At 16:14 this writer called [facility] and spoke to Coralee North, who accepted demographic information by telephone and acknowledged receipt of referral information.  I have specified that we are requesting that pt be prioritized based on his physical aggression in the ED.  Please note that as of this writing pt has not been accepted to the facility, but they have agreed to staff the pt with their provider.  As of this writing a final decision is pending.   Doylene Canning, MA Triage Specialist [phone]

## 2020-08-30 NOTE — ED Notes (Signed)
Pt refusing vitals and to answer questions from me. Only states "I want my discharge papers and to go home". PD outside room monitoring for aggressive behavior.

## 2020-08-30 NOTE — ED Notes (Signed)
Pt refusing vitals. States "you guys piss me off". Pt covered up in blankets. PD at bedside

## 2020-08-30 NOTE — ED Notes (Signed)
Pt refusing vitals at this time. Pt hiding under covers. Pd monitoring from outside room

## 2020-08-31 DIAGNOSIS — R45851 Suicidal ideations: Secondary | ICD-10-CM

## 2020-08-31 NOTE — Discharge Instructions (Addendum)
Follow-up as instructed by behavioral health 

## 2020-08-31 NOTE — Consult Note (Addendum)
[facility] Face-to-Face Psychiatry Consult   Reason for Consult:  Suicidal Ideation  Referring Physician:  ED  Patient Identification: Greg Morales MRN:  [id number] Principal Diagnosis: <principal problem not specified> Diagnosis:  Active Problems:   * No active hospital problems. *   Total Time spent with patient: 15 minutes  Subjective:   Greg Morales is a 34 y.o. male BPH was seen and evaluated face to face. Greg Morales reported  chronic abdominal pain which causes him to make suicidal statements.  He denied plan or intent. Patient denied history with suicidal attempt. Stated that he is followed by [facility] for bipolar disorder and depression.  Denies that he is taking medication prescribed. Chart reviewed was positive for Cocaine, Benzodiazepine, Amphetamines and THC. Currently he is denying suicidal or homicidal ideations.  Denies auditory visual hallucinations.  Patient continues to report abdominal pain.  Case staffed with attending psychiatrist Jye Fariss.  Will provide patient with additional outpatient resources.  Greg Morales provided verbal authorization to follow-up with patient's wife. Greg Morales [phone] denied any safety concerns, however would like patient to be restarted with his medications for depression and bipolar disorder.  Support encouragement reassurance was provided.  HPI:  Per admission assessment note: Greg Morales is a 34 y.o. male hx of seizure, polysubstance abuse, here with seizure. Patient was recently admitted for seizure and was given Keppra while in the hospital.  He was discharged from the hospital to behavioral health and apparently was sent home.  Patient came in the ED earlier and was left without being seen and then went to behavioral health urgent care.  Per the note narrative, patient was sent home with outpatient resources but apparently patient became agitated right after the note and he was IVC by behavioral health.  Patient then had a possible seizure episode  before EMS got there.  When EMS got there he was agitated and cursing.  He refused to answer questions and states that he wants to leave and then complained that he is here all day and nobody is caring for him.  Per the IVC paper, patient admitted to thoughts of harming himself.  He would not elaborate on that.  He had previous suicidal attempts.   Past Psychiatric History:   Risk to Self:   Risk to Others:   Prior Inpatient Therapy:   Prior Outpatient Therapy:    Past Medical History:  Past Medical History:  Diagnosis Date  . Chronic abdominal pain   . Medically noncompliant   . Polysubstance abuse (HCC)   . Seizures (HCC)    since childhood    Past Surgical History:  Procedure Laterality Date  . ESOPHAGOGASTRODUODENOSCOPY     Family History:  Family History  Problem Relation Age of Onset  . Hypertension Other   . Seizures Other   . Seizures Maternal Grandmother    Family Psychiatric  History:  Social History:  Social History   Substance and Sexual Activity  Alcohol Use Not Currently     Social History   Substance and Sexual Activity  Drug Use Not Currently  . Types: Marijuana    Social History   Socioeconomic History  . Marital status: Married    Spouse name: Greg Morales  . Number of children: 3  . Years of education: Not on file  . Highest education level: Not on file  Occupational History  . Not on file  Tobacco Use  . Smoking status: Current Every Day Smoker    Packs/day: 1.00    Years: 5.00  Pack years: 5.00    Types: Cigarettes  . Smokeless tobacco: Never Used  Vaping Use  . Vaping Use: Never used  Substance and Sexual Activity  . Alcohol use: Not Currently  . Drug use: Not Currently    Types: Marijuana  . Sexual activity: Not Currently  Other Topics Concern  . Not on file  Social History Narrative   History of seizures since childhood per patient   Social Determinants of Health   Financial Resource Strain:   . Difficulty of Paying Living  Expenses: Not on file  Food Insecurity:   . Worried About Programme researcher, broadcasting/film/video in the Last Year: Not on file  . Ran Out of Food in the Last Year: Not on file  Transportation Needs:   . Lack of Transportation (Medical): Not on file  . Lack of Transportation (Non-Medical): Not on file  Physical Activity:   . Days of Exercise per Week: Not on file  . Minutes of Exercise per Session: Not on file  Stress:   . Feeling of Stress : Not on file  Social Connections:   . Frequency of Communication with Friends and Family: Not on file  . Frequency of Social Gatherings with Friends and Family: Not on file  . Attends Religious Services: Not on file  . Active Member of Clubs or Organizations: Not on file  . Attends Banker Meetings: Not on file  . Marital Status: Not on file   Additional Social History:    Allergies:  No Known Allergies  Labs:  Results for orders placed or performed during the hospital encounter of 08/29/20 (from the past 48 hour(s))  CBC with Differential/Platelet     Status: None   Collection Time: 08/29/20 11:47 PM  Result Value Ref Range   WBC 8.7 4.0 - 10.5 K/uL   RBC 4.45 4.22 - 5.81 MIL/uL   Hemoglobin 14.3 13.0 - 17.0 g/dL   HCT 54.2 39 - 52 %   MCV 98.7 80.0 - 100.0 fL   MCH 32.1 26.0 - 34.0 pg   MCHC 32.6 30.0 - 36.0 g/dL   RDW 70.6 23.7 - 62.8 %   Platelets 281 150 - 400 K/uL   nRBC 0.0 0.0 - 0.2 %   Neutrophils Relative % 59 %   Neutro Abs 5.2 1.7 - 7.7 K/uL   Lymphocytes Relative 33 %   Lymphs Abs 2.9 0.7 - 4.0 K/uL   Monocytes Relative 6 %   Monocytes Absolute 0.6 0.1 - 1.0 K/uL   Eosinophils Relative 1 %   Eosinophils Absolute 0.1 0.0 - 0.5 K/uL   Basophils Relative 1 %   Basophils Absolute 0.1 0.0 - 0.1 K/uL   Immature Granulocytes 0 %   Abs Immature Granulocytes 0.03 0.00 - 0.07 K/uL    Comment: Performed at [facility], [address], [place], [place] [address]  Comprehensive metabolic panel     Status: None    Collection Time: 08/29/20 11:47 PM  Result Value Ref Range   Sodium 142 135 - 145 mmol/L   Potassium 3.6 3.5 - 5.1 mmol/L   Chloride 108 98 - 111 mmol/L   CO2 22 22 - 32 mmol/L   Glucose, Bld 71 70 - 99 mg/dL    Comment: Glucose reference range applies only to samples taken after fasting for at least 8 hours.   BUN 9 6 - 20 mg/dL   Creatinine, Ser 6.16 0.61 - 1.24 mg/dL   Calcium 9.2 8.9 -  10.3 mg/dL   Total Protein 6.7 6.5 - 8.1 g/dL   Albumin 3.7 3.5 - 5.0 g/dL   AST 28 15 - 41 U/L   ALT 24 0 - 44 U/L   Alkaline Phosphatase 45 38 - 126 U/L   Total Bilirubin 0.8 0.3 - 1.2 mg/dL   GFR, Estimated >78>60 >29>60 mL/min    Comment: (NOTE) Calculated using the CKD-EPI Creatinine Equation (2021)    Anion gap 12 5 - 15    Comment: Performed at [facility], [address] [place], [place] [address]  Ethanol     Status: None   Collection Time: 08/29/20 11:47 PM  Result Value Ref Range   Alcohol, Ethyl (B) <10 <10 mg/dL    Comment: (NOTE) Lowest detectable limit for serum alcohol is 10 mg/dL.  For medical purposes only. Performed at [facility], [address] [place], [place] [address]   Salicylate level     Status: Abnormal   Collection Time: 08/29/20 11:47 PM  Result Value Ref Range   Salicylate Lvl <7.0 (L) 7.0 - 30.0 mg/dL    Comment: Performed at [facility], [address] [place], [place] [address]  Acetaminophen level     Status: Abnormal   Collection Time: 08/29/20 11:47 PM  Result Value Ref Range   Acetaminophen (Tylenol), Serum <10 (L) 10 - 30 ug/mL    Comment: (NOTE) Therapeutic concentrations vary significantly. A range of 10-30 ug/mL  may be an effective concentration for many patients. However, some  are best treated at concentrations outside of this range. Acetaminophen concentrations >150 ug/mL at 4 hours after ingestion  and >50 ug/mL at 12 hours after ingestion are often associated with  toxic  reactions.  Performed at [facility], [address] [place], [place] [address]     No current facility-administered medications for this encounter.   Current Outpatient Medications  Medication Sig Dispense Refill  . levETIRAcetam (KEPPRA) 500 MG tablet Take 2 tabs PO Q a.m and 1 tab Po Q p.m (Patient taking differently: Take 500-1,000 mg by mouth 2 (two) times daily. Take 2 tabs (1000mg ) PO Q a.m and 1 tab (500mg ) Po Q p.m) 90 tablet 6  . ondansetron (ZOFRAN ODT) 4 MG disintegrating tablet 4mg  ODT q4 hours prn nausea/vomit (Patient taking differently: Take 4 mg by mouth every 4 (four) hours as needed for nausea or vomiting. ) 20 tablet 0  . promethazine (PHENERGAN) 25 MG suppository Place 1 suppository (25 mg total) rectally every 6 (six) hours as needed for nausea or vomiting. 12 each 0  . lamoTRIgine (LAMICTAL) 100 MG tablet Take 1 tablet (100 mg total) by mouth daily. Take 3 tablets by mouth in the evening with meals (Patient not taking: Reported on 08/29/2020) 90 tablet 6    Musculoskeletal: Strength & Muscle Tone: within normal limits Gait & Station: N/A Patient leans: N/A  Psychiatric Specialty Exam: Physical Exam Vitals reviewed.  Neurological:     Mental Status: He is alert.  Psychiatric:        Mood and Affect: Mood normal.        Thought Content: Thought content normal.     Review of Systems  Gastrointestinal: Positive for abdominal pain and nausea.  Psychiatric/Behavioral: Positive for behavioral problems. Negative for suicidal ideas.  All other systems reviewed and are negative.   Blood pressure 122/68, pulse 90, resp. rate 18, height 5\' 5"  (1.651 m), weight 62.1 kg, SpO2 99 %.Body mass  index is 22.78 kg/m.  General Appearance: Casual  Eye Contact:  Good  Speech:  Clear and Coherent  Volume:  Normal  Mood:  Anxious and Depressed  Affect:  Congruent  Thought Process:  Coherent  Orientation:  Full (Time, Place, and Person)  Thought  Content:  Logical  Suicidal Thoughts:  No "reported due to abdominal pain"  Homicidal Thoughts:  No  Memory:  Immediate;   Fair Recent;   Fair  Judgement:  Fair  Insight:  Fair  Psychomotor Activity:  Normal  Concentration:  Concentration: Fair  Recall:  [employer] of Knowledge:  Fair  Language:  Fair  Akathisia:  No  Handed:  Right  AIMS (if indicated):     Assets:  Architect Physical Health Social Support  ADL's:  Intact  Cognition:  WNL  Sleep:        Disposition: No evidence of imminent risk to self or others at present.   Patient does not meet criteria for psychiatric inpatient admission. Supportive therapy provided about ongoing stressors. Refer to IOP. Discussed crisis plan, support from social network, calling 911, coming to the Emergency Department, and calling Suicide Hotline.  Oneta Rack, NP 08/31/2020 10:58 AM  Patient seen face to face for psychiatric evaluation, chart reviewed and case discussed with the physician extender and developed treatment plan. Reviewed the information documented and agree with the treatment plan. Thedore Mins, MD

## 2020-09-02 ENCOUNTER — Encounter (HOSPITAL_COMMUNITY): Payer: Self-pay

## 2020-09-02 ENCOUNTER — Other Ambulatory Visit: Payer: Self-pay

## 2020-09-02 ENCOUNTER — Observation Stay (HOSPITAL_COMMUNITY)
Admission: EM | Admit: 2020-09-02 | Discharge: 2020-09-03 | Disposition: A | Discharge status: Home / Self Care | Payer: Self-pay | Attending: Internal Medicine | Admitting: Internal Medicine

## 2020-09-02 DIAGNOSIS — Z20822 Contact with and (suspected) exposure to covid-19: Secondary | ICD-10-CM | POA: Insufficient documentation

## 2020-09-02 DIAGNOSIS — R1013 Epigastric pain: Secondary | ICD-10-CM

## 2020-09-02 DIAGNOSIS — R1084 Generalized abdominal pain: Secondary | ICD-10-CM

## 2020-09-02 DIAGNOSIS — R112 Nausea with vomiting, unspecified: Secondary | ICD-10-CM

## 2020-09-02 DIAGNOSIS — K2970 Gastritis, unspecified, without bleeding: Principal | ICD-10-CM | POA: Insufficient documentation

## 2020-09-02 DIAGNOSIS — R634 Abnormal weight loss: Secondary | ICD-10-CM

## 2020-09-02 LAB — COMPREHENSIVE METABOLIC PANEL WITH GFR
ALT: 25 U/L (ref 0–44)
AST: 23 U/L (ref 15–41)
Albumin: 4.2 g/dL (ref 3.5–5.0)
Alkaline Phosphatase: 51 U/L (ref 38–126)
Anion gap: 8 (ref 5–15)
BUN: <5 mg/dL — ABNORMAL LOW (ref 6–20)
CO2: 28 mmol/L (ref 22–32)
Calcium: 9.3 mg/dL (ref 8.9–10.3)
Chloride: 104 mmol/L (ref 98–111)
Creatinine, Ser: 0.88 mg/dL (ref 0.61–1.24)
GFR, Estimated: >60 mL/min
Glucose, Bld: 92 mg/dL (ref 70–99)
Potassium: 3.5 mmol/L (ref 3.5–5.1)
Sodium: 140 mmol/L (ref 135–145)
Total Bilirubin: 0.6 mg/dL (ref 0.3–1.2)
Total Protein: 7.3 g/dL (ref 6.5–8.1)

## 2020-09-02 LAB — URINALYSIS, ROUTINE W REFLEX MICROSCOPIC
Bilirubin Urine: NEGATIVE
Glucose, UA: NEGATIVE mg/dL
Hgb urine dipstick: NEGATIVE
Ketones, ur: NEGATIVE mg/dL
Leukocytes,Ua: NEGATIVE
Nitrite: NEGATIVE
Protein, ur: NEGATIVE mg/dL
Specific Gravity, Urine: 1.025 (ref 1.005–1.030)
pH: 5 (ref 5.0–8.0)

## 2020-09-02 LAB — CBC
HCT: 49.4 % (ref 39.0–52.0)
Hemoglobin: 16.5 g/dL (ref 13.0–17.0)
MCH: 31.9 pg (ref 26.0–34.0)
MCHC: 33.4 g/dL (ref 30.0–36.0)
MCV: 95.4 fL (ref 80.0–100.0)
Platelets: 304 10*3/uL (ref 150–400)
RBC: 5.18 MIL/uL (ref 4.22–5.81)
RDW: 13.4 % (ref 11.5–15.5)
WBC: 6.2 10*3/uL (ref 4.0–10.5)
nRBC: 0 % (ref 0.0–0.2)

## 2020-09-02 LAB — RESP PANEL BY RT-PCR (FLU A&B, COVID) ARPGX2
Influenza A by PCR: NEGATIVE
Influenza B by PCR: NEGATIVE
SARS Coronavirus 2 by RT PCR: NEGATIVE

## 2020-09-02 LAB — LIPASE, BLOOD: Lipase: 171 U/L — ABNORMAL HIGH (ref 11–51)

## 2020-09-02 MED ORDER — PANTOPRAZOLE SODIUM 40 MG IV SOLR
40.0000 mg | Freq: Two times a day (BID) | INTRAVENOUS | Status: DC
  Administered 2020-09-02: 40 mg via INTRAVENOUS
  Filled 2020-09-02: qty 40

## 2020-09-02 MED ORDER — ACETAMINOPHEN 325 MG PO TABS: 650.0000 mg | ORAL_TABLET | Freq: Four times a day (QID) | ORAL | Status: DC | PRN

## 2020-09-02 MED ORDER — CAPSAICIN 0.025 % EX CREA
TOPICAL_CREAM | Freq: Once | CUTANEOUS | Status: DC
  Filled 2020-09-02: qty 60

## 2020-09-02 MED ORDER — MORPHINE SULFATE (PF) 4 MG/ML IV SOLN
4.0000 mg | Freq: Once | INTRAVENOUS | Status: AC
  Administered 2020-09-02: 4 mg via INTRAVENOUS
  Filled 2020-09-02: qty 1

## 2020-09-02 MED ORDER — LEVETIRACETAM 500 MG PO TABS
500.0000 mg | ORAL_TABLET | Freq: Every day | ORAL | Status: DC
  Administered 2020-09-02: 500 mg via ORAL
  Filled 2020-09-02: qty 1

## 2020-09-02 MED ORDER — LEVETIRACETAM 500 MG PO TABS
500.0000 mg | ORAL_TABLET | Freq: Once | ORAL | Status: AC
  Administered 2020-09-02: 500 mg via ORAL
  Filled 2020-09-02: qty 1

## 2020-09-02 MED ORDER — LEVETIRACETAM 500 MG PO TABS
1000.0000 mg | ORAL_TABLET | Freq: Every morning | ORAL | Status: DC
  Administered 2020-09-03: 12:00:00 1000 mg via ORAL
  Filled 2020-09-02: qty 2

## 2020-09-02 MED ORDER — ACETAMINOPHEN 650 MG RE SUPP: 650.0000 mg | Freq: Four times a day (QID) | RECTAL | Status: DC | PRN

## 2020-09-02 MED ORDER — KETOROLAC TROMETHAMINE 30 MG/ML IJ SOLN
30.0000 mg | Freq: Once | INTRAMUSCULAR | Status: AC
  Administered 2020-09-02: 30 mg via INTRAVENOUS
  Filled 2020-09-02: qty 1

## 2020-09-02 MED ORDER — ONDANSETRON HCL 4 MG/2ML IJ SOLN
4.0000 mg | Freq: Four times a day (QID) | INTRAMUSCULAR | Status: DC
  Administered 2020-09-02 – 2020-09-03 (×5): 4 mg via INTRAVENOUS
  Filled 2020-09-02 (×5): qty 2

## 2020-09-02 MED ORDER — ONDANSETRON HCL 4 MG/2ML IJ SOLN
4.0000 mg | Freq: Once | INTRAMUSCULAR | Status: AC
  Administered 2020-09-02: 4 mg via INTRAVENOUS
  Filled 2020-09-02: qty 2

## 2020-09-02 MED ORDER — SODIUM CHLORIDE 0.9 % IV BOLUS
1000.0000 mL | Freq: Once | INTRAVENOUS | Status: AC
  Administered 2020-09-02: 1000 mL via INTRAVENOUS

## 2020-09-02 MED ORDER — PANTOPRAZOLE SODIUM 40 MG IV SOLR
40.0000 mg | Freq: Once | INTRAVENOUS | Status: AC
  Administered 2020-09-02: 40 mg via INTRAVENOUS
  Filled 2020-09-02: qty 40

## 2020-09-02 NOTE — H&P (Signed)
History and Physical    Greg Morales IDP:[id number] DOB: [birth year] DOA: 09/02/2020  PCP: [name], MD  Patient coming from: Home  Chief Complaint: N/V  HPI: Greg Morales is a 34 y.o. male with medical history significant of chronic abdominal pain. Presenting with epigastric abdominal pain. Reports that he first noticed his problem back in May. It has been progressing since.  He presented to [facility] and [facility] for eval for has not completed w/u per his reports. He was set to have an EGD w/ [facility] at the beginning of the month, but he left AMA. He returns today with ongoing, sharp, non-radiating ab pain. He has tried tea and hot showers to alleviate the pain, but it has not helped. He reports no other alleviating or aggravating factors. He reports no other treatments.   ED Course: EDP spoke with [facility] who recommended admission while they consult. [facility] was called for admission.   Review of Systems:  Review of systems is negative for all not mentioned in HPI.   PMHx Past Medical History:  Diagnosis Date  . Chronic abdominal pain   . Medically noncompliant   . Polysubstance abuse (HCC)   . Seizures (HCC)    since childhood    PSHx Past Surgical History:  Procedure Laterality Date  . ESOPHAGOGASTRODUODENOSCOPY      [facility]  reports that he has been smoking cigarettes. He has a 5.00 pack-year smoking history. He has never used smokeless tobacco. He reports previous alcohol use. He reports previous drug use. Drug: Marijuana.  No Known Allergies  FamHx Family History  Problem Relation Age of Onset  . Hypertension Other   . Seizures Other   . Seizures Maternal Grandmother     Prior to Admission medications   Medication Sig Start Date End Date Taking? Authorizing Provider  lamoTRIgine (LAMICTAL) 100 MG tablet Take 1 tablet (100 mg total) by mouth daily. Take 3 tablets by mouth in the evening with meals Patient not taking: Reported on 08/29/2020 05/24/18   [name], MD   levETIRAcetam (KEPPRA) 500 MG tablet Take 2 tabs PO Q a.m and 1 tab Po Q p.m Patient taking differently: Take 500-1,000 mg by mouth 2 (two) times daily. Take 2 tabs (1000mg ) PO Q a.m and 1 tab (500mg ) Po Q p.m 07/31/19   , MD  ondansetron (ZOFRAN ODT) 4 MG disintegrating tablet 4mg  ODT q4 hours prn nausea/vomit Patient taking differently: Take 4 mg by mouth every 4 (four) hours as needed for nausea or vomiting.  08/27/20   [name], DO  promethazine (PHENERGAN) 25 MG suppository Place 1 suppository (25 mg total) rectally every 6 (six) hours as needed for nausea or vomiting. 08/27/20   08/29/20, DO    Physical Exam: Vitals:   09/02/20 1130 09/02/20 1300 09/02/20 1330 09/02/20 1400  BP: 118/88 (!) 130/91 104/80 113/77  Pulse: 70 69 72 69  Resp: 17 16 11 20   Temp:      TempSrc:      SpO2: 100% 99% 99% 99%  Weight:      Height:        General: 34 y.o. male resting in bed in NAD Eyes: PERRL, normal sclera ENMT: Nares patent w/o discharge, orophaynx clear, dentition normal, ears w/o discharge/lesions/ulcers Neck: Supple, trachea midline Cardiovascular: RRR, +S1, S2, no m/g/r, equal pulses throughout Respiratory: CTABL, no w/r/r, normal WOB GI: BS+, ND, epigastric TTP, no masses noted, no organomegaly noted MSK: No e/c/c Skin: No rashes,  bruises, ulcerations noted Neuro: A&O x 3, no focal deficits Psyc: Appropriate interaction and affect, calm/cooperative  Labs on Admission: I have personally reviewed following labs and imaging studies  CBC: Recent Labs  Lab 08/27/20 1025 08/28/20 0625 08/29/20 2347 09/02/20 1100  WBC 8.2 8.1 8.7 6.2  NEUTROABS 7.2  --  5.2  --   HGB 15.6 14.2 14.3 16.5  HCT 46.7 43.1 43.9 49.4  MCV 95.1 96.4 98.7 95.4  PLT 342 282 281 304   Basic Metabolic Panel: Recent Labs  Lab 08/27/20 1025 08/28/20 0625 08/29/20 2347 09/02/20 1017  NA 144 143 142 140  K 4.2 3.5 3.6 3.5  CL 106 109 108 104  CO2 27 26 22 28   GLUCOSE 140*  95 71 92  BUN 10 10 9  <5*  CREATININE 1.20 0.99 1.08 0.88  CALCIUM 10.3 9.2 9.2 9.3  MG 1.9  --   --   --    GFR: Estimated Creatinine Clearance: 101.4 mL/min (by C-G formula based on SCr of 0.88 mg/dL). Liver Function Tests: Recent Labs  Lab 08/27/20 1025 08/29/20 2347 09/02/20 1017  AST 24 28 23   ALT 36 24 25  ALKPHOS 59 45 51  BILITOT 0.7 0.8 0.6  PROT 7.8 6.7 7.3  ALBUMIN 4.4 3.7 4.2   Recent Labs  Lab 09/02/20 1017  LIPASE 171*   No results for input(s): AMMONIA in the last 168 hours. Coagulation Profile: No results for input(s): INR, PROTIME in the last 168 hours. Cardiac Enzymes: No results for input(s): CKTOTAL, CKMB, CKMBINDEX, TROPONINI in the last 168 hours. BNP (last 3 results) No results for input(s): PROBNP in the last 8760 hours. HbA1C: No results for input(s): HGBA1C in the last 72 hours. CBG: Recent Labs  Lab 08/27/20 1024  GLUCAP 142*   Lipid Profile: No results for input(s): CHOL, HDL, LDLCALC, TRIG, CHOLHDL, LDLDIRECT in the last 72 hours. Thyroid Function Tests: No results for input(s): TSH, T4TOTAL, FREET4, T3FREE, THYROIDAB in the last 72 hours. Anemia Panel: No results for input(s): VITAMINB12, FOLATE, FERRITIN, TIBC, IRON, RETICCTPCT in the last 72 hours. Urine analysis:    Component Value Date/Time   COLORURINE YELLOW 02/07/2020 0939   APPEARANCEUR CLEAR 02/07/2020 0939   LABSPEC 1.023 02/07/2020 0939   PHURINE 7.0 02/07/2020 0939   GLUCOSEU NEGATIVE 02/07/2020 0939   HGBUR NEGATIVE 02/07/2020 0939   BILIRUBINUR NEGATIVE 02/07/2020 0939   KETONESUR 20 (A) 02/07/2020 0939   PROTEINUR NEGATIVE 02/07/2020 0939   UROBILINOGEN 1.0 01/27/2015 0907   NITRITE NEGATIVE 02/07/2020 0939   LEUKOCYTESUR NEGATIVE 02/07/2020 0939    Radiological Exams on Admission: No results found.  Assessment/Plan Epigastric abdominal pain     - admit to obs, med-surg     - seen by [facility], appreciate assistance     - CLD for now, NPO pMN, PPI BID,  scheduled zofran     - UDS is pending     - EGD scheduled for tomorrow  Marijuana abuse     - counseled against further use  Elevated lipase     - follow; see ab pain above  Seizure disorder     - continue keppra  DVT prophylaxis: SCDs  Code Status: FULL  Family Communication: None at bedside  Consults called: EDP spoke with GI  Status is: Observation  The patient remains OBS appropriate and will d/c before 2 midnights.  Dispo: The patient is from: Home              Anticipated  d/c is to: Home              Anticipated d/c date is: 1 day              Patient currently is not medically stable to d/c.  Teddy Spike DO [facility]  If 7PM-7AM, please contact night-coverage [email or web address]  09/02/2020, 2:08 PM

## 2020-09-02 NOTE — ED Provider Notes (Signed)
[facility]-EMERGENCY DEPT Provider Note   CSN: [id number] Arrival date & time: 09/02/20  7564     History Chief Complaint  Patient presents with  . Abdominal Pain  . Emesis  . Diarrhea    Greg Morales is a 34 y.o. male.  Patient is a 34 year old male with a history of chronic abdominal pain and cyclical vomiting, substance abuse, seizure disorder on Keppra, bipolar disorder who presents with abdominal pain associated nausea and vomiting.  He says that he has had this for years.  Recently it started getting worse again in May.  He initially told me that he has not seen a gastroenterologist since he was in [place] which was several years ago.  However on record review, he previously was seen a gastroenterologist in [place] which he stopped.  He was supposed to get an EGD at that time but did not.  He establish care at the end of November of this year with [facility] gastroenterology.  When he was seen in the office, he was having a flareup of his abdominal pain with nausea vomiting and was sent to the ED for admission and EGD.  He was admitted and the EGD was scheduled.  However apparently when he went down to have the EGD done, he expressed some suicidal ideations.  At this point he was taken back to his room and had a psych consult.  He was cleared from a psychiatric standpoint but became aggressive and ended up leaving AMA.  He says that this was his fault and he is no longer having any suicidal ideations.  He requests to be readmitted to have an EGD.  He denies any recent hematemesis.  No fevers.  He does report weight loss of about 100 pounds over the last few months.        Past Medical History:  Diagnosis Date  . Chronic abdominal pain   . Medically noncompliant   . Polysubstance abuse (HCC)   . Seizures (HCC)    since childhood    Patient Active Problem List   Diagnosis Date Noted  . Substance induced mood disorder (HCC)   . Nausea and vomiting  08/27/2020  . Loss of weight 08/27/2020  . Intractable nausea and vomiting 08/27/2020  . Marijuana abuse 08/27/2020  . Seizures (HCC) 08/27/2020  . Lung nodule < 6cm on CT 07/27/2019  . 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 Tobacco dependence 05/24/2018  . History of bipolar disorder 05/24/2018  . Elevated blood lead level 05/24/2018    Past Surgical History:  Procedure Laterality Date  . ESOPHAGOGASTRODUODENOSCOPY         Family History  Problem Relation Age of Onset  . Hypertension Other   . Seizures Other   . Seizures Maternal Grandmother     Social History   Tobacco Use  . Smoking status: Current Every Day Smoker    Packs/day: 1.00    Years: 5.00    Pack years: 5.00    Types: Cigarettes  . Smokeless tobacco: Never Used  Vaping Use  . Vaping Use: Never used  Substance Use Topics  . Alcohol use: Not Currently  . Drug use: Not Currently    Types: Marijuana    Home Medications Prior to Admission medications   Medication Sig Start Date End Date Taking? Authorizing Provider  levETIRAcetam (KEPPRA) 500 MG tablet Take 2 tabs PO Q a.m and 1 tab Po Q p.m Patient taking differently: Take 500 mg by mouth in the  morning, at noon, and at bedtime.  07/31/19  Yes [name], MD  lamoTRIgine (LAMICTAL) 100 MG tablet Take 1 tablet (100 mg total) by mouth daily. Take 3 tablets by mouth in the evening with meals Patient not taking: Reported on 09/02/2020 05/24/18   [name], MD  ondansetron (ZOFRAN ODT) 4 MG disintegrating tablet 4mg  ODT q4 hours prn nausea/vomit Patient not taking: Reported on 09/02/2020 08/27/20   08/29/20, DO  promethazine (PHENERGAN) 25 MG suppository Place 1 suppository (25 mg total) rectally every 6 (six) hours as needed for nausea or vomiting. Patient not taking: Reported on 09/02/2020 08/27/20   08/29/20, DO    Allergies    Patient has no known allergies.  Review of Systems   Review of Systems  Constitutional: Negative for chills,  diaphoresis, fatigue and fever.  HENT: Negative for congestion, rhinorrhea and sneezing.   Eyes: Negative.   Respiratory: Negative for cough, chest tightness and shortness of breath.   Cardiovascular: Negative for chest pain and leg swelling.  Gastrointestinal: Positive for abdominal pain, nausea and vomiting. Negative for blood in stool and diarrhea.  Genitourinary: Negative for difficulty urinating, flank pain, frequency and hematuria.  Musculoskeletal: Negative for arthralgias and back pain.  Skin: Negative for rash.  Neurological: Negative for dizziness, speech difficulty, weakness, numbness and headaches.    Physical Exam Updated Vital Signs BP 113/77   Pulse 69   Temp 98.9 F (37.2 C) (Oral)   Resp 20   Ht 5\' 5"  (1.651 m)   Wt 61.2 kg   SpO2 99%   BMI 22.47 kg/m   Physical Exam Constitutional:      Appearance: He is well-developed.  HENT:     Head: Normocephalic and atraumatic.  Eyes:     Pupils: Pupils are equal, round, and reactive to light.  Cardiovascular:     Rate and Rhythm: Normal rate and regular rhythm.     Heart sounds: Normal heart sounds.  Pulmonary:     Effort: Pulmonary effort is normal. No respiratory distress.     Breath sounds: Normal breath sounds. No wheezing or rales.  Chest:     Chest wall: No tenderness.  Abdominal:     General: Bowel sounds are normal.     Palpations: Abdomen is soft.     Tenderness: There is generalized abdominal tenderness. There is no guarding or rebound.  Musculoskeletal:        General: Normal range of motion.     Cervical back: Normal range of motion and neck supple.  Lymphadenopathy:     Cervical: No cervical adenopathy.  Skin:    General: Skin is warm and dry.     Findings: No rash.  Neurological:     Mental Status: He is alert and oriented to person, place, and time.     ED Results / Procedures / Treatments   Labs (all labs ordered are listed, but only abnormal results are displayed) Labs Reviewed   LIPASE, BLOOD - Abnormal; Notable for the following components:      Result Value   Lipase 171 (*)    All other components within normal limits  COMPREHENSIVE METABOLIC PANEL - Abnormal; Notable for the following components:   BUN <5 (*)    All other components within normal limits  RESP PANEL BY RT-PCR (FLU A&B, COVID) ARPGX2  CBC  URINALYSIS, ROUTINE W REFLEX MICROSCOPIC  DRUG PROFILE, UR, 9 DRUGS ([facility])    EKG None  Radiology No results found.  Procedures Procedures (including critical care time)  Medications Ordered in ED Medications  capsaicin (ZOSTRIX) 0.025 % cream ( Topical Refused 09/02/20 1329)  morphine 4 MG/ML injection 4 mg (has no administration in time range)  pantoprazole (PROTONIX) injection 40 mg (has no administration in time range)  ondansetron (ZOFRAN) injection 4 mg (has no administration in time range)  morphine 4 MG/ML injection 4 mg (4 mg Intravenous Given 09/02/20 1135)  sodium chloride 0.9 % bolus 1,000 mL (0 mLs Intravenous Stopped 09/02/20 1329)  ondansetron (ZOFRAN) injection 4 mg (4 mg Intravenous Given 09/02/20 1135)  pantoprazole (PROTONIX) injection 40 mg (40 mg Intravenous Given 09/02/20 1135)  levETIRAcetam (KEPPRA) tablet 500 mg (500 mg Oral Given 09/02/20 1326)    ED Course  I have reviewed the triage vital signs and the nursing notes.  Pertinent labs & imaging results that were available during my care of the patient were reviewed by me and considered in my medical decision making (see chart for details).    MDM Rules/Calculators/A&P                          Patient is a 34 year old male who presents with abdominal pain associated with nausea and vomiting.  Similar to his prior episodes although he has been having worsening episodes since May.  He has had a CT scan in May of his abdomen pelvis as well as 2 in 2020.  He does not have any new symptoms, he is just having worsening symptoms that he has had in the past.  He request to be  admitted to the hospital to get an EGD.  I spoke with Amy with [facility] who advises that they will see the patient today and plan on doing EGD tomorrow.  He was given treatment control with IV morphine and Zofran as well as IV fluids.  His lipase is mildly elevated.  His other LFTs are normal.  I spoke with Dr. Ronaldo Miyamoto with the hospitalist service to admit the patient for further treatment. Final Clinical Impression(s) / ED Diagnoses Final diagnoses:  Generalized abdominal pain    Rx / DC Orders ED Discharge Orders    None       Rolan Bucco, MD 09/02/20 1418

## 2020-09-02 NOTE — H&P (View-Only) (Signed)
Consultation  Referring Provider:  EDP / Dr. Fredderick Phenix Primary Care Physician:  [name], MD Primary Gastroenterologist:  Dr. Adela Lank  Reason for Consultation:  Nausea/vomiting, abdominal pain, weight loss  HPI: Greg Morales is a 34 y.o. male , who presented to the emergency room this morning with recurrent complaints of nausea vomiting and abdominal pain. Patient was initially seen in our office on 08/27/2020 by Doug Sou, PA-C with complaints of ongoing issues with daily nausea and vomiting and abdominal pain which had been going on long-term but worse over the past 6 months. Patient stated that he had had 100 pound weight loss over the past year or so. At that time per the notes he had related that he had tried PPIs in the past without any benefit. He had been evaluated by GI at [facility] in October 2021 and was to have EGD but did not follow through with that. He has had several prior CT scans, the last one in our system was done in May 2021 for nausea vomiting and abdominal pain and was negative other than a thickened distal esophagus. When he was seen in the office on 08/27/2020 per the notes he was moaning and crying and unable to give much history. For that reason he was admitted. At that time he had drug screen that was positive for THC, benzos, cocaine and amphetamines. Remainder of baseline labs unremarkable. He was scheduled to have a EGD on 08/28/2020 with Dr. Meridee Score. After admission he became agitated and aggressive and then expressed some suicidal ideation. Ultimately refused to have EGD and left AMA later that day, after cleared by psych and felt to be low risk.Marland Kitchen He had behavioral health assessment on 12/2 and 08/30/2020 and was seen by psych on 08/31/2020. Per those notes he does have history of bipolar disorder. He wanted to be discharged and was determined not to be at imminent risk. Patient denies any alcohol use, he denies any recreational drug use other than  marijuana and does admit to smoking marijuana long-term on a daily basis usually a couple of times per day. No use of aspirin Advil BCs Goody's etc.  Labs today in ER with CBC within normal limits, CMP unremarkable and lipase of 171.   Past Medical History:  Diagnosis Date  . Chronic abdominal pain   . Medically noncompliant   . Polysubstance abuse (HCC)   . Seizures (HCC)    since childhood    Past Surgical History:  Procedure Laterality Date  . ESOPHAGOGASTRODUODENOSCOPY      Prior to Admission medications   Medication Sig Start Date End Date Taking? Authorizing Provider  lamoTRIgine (LAMICTAL) 100 MG tablet Take 1 tablet (100 mg total) by mouth daily. Take 3 tablets by mouth in the evening with meals Patient not taking: Reported on 08/29/2020 05/24/18   [name], MD  levETIRAcetam (KEPPRA) 500 MG tablet Take 2 tabs PO Q a.m and 1 tab Po Q p.m Patient taking differently: Take 500-1,000 mg by mouth 2 (two) times daily. Take 2 tabs (1000mg ) PO Q a.m and 1 tab (500mg ) Po Q p.m 07/31/19   , MD  ondansetron (ZOFRAN ODT) 4 MG disintegrating tablet 4mg  ODT q4 hours prn nausea/vomit Patient taking differently: Take 4 mg by mouth every 4 (four) hours as needed for nausea or vomiting.  08/27/20   [name], DO  promethazine (PHENERGAN) 25 MG suppository Place 1 suppository (25 mg total) rectally every 6 (six) hours as  needed for nausea or vomiting. 08/27/20   Melene PlanFloyd, Dan, DO    Current Facility-Administered Medications  Medication Dose Route Frequency Provider Last Rate Last Admin  . capsaicin (ZOSTRIX) 0.025 % cream   Topical Once Rolan BuccoBelfi, Melanie, MD      . levETIRAcetam (KEPPRA) tablet 500 mg  500 mg Oral Once Rolan BuccoBelfi, Melanie, MD       Current Outpatient Medications  Medication Sig Dispense Refill  . lamoTRIgine (LAMICTAL) 100 MG tablet Take 1 tablet (100 mg total) by mouth daily. Take 3 tablets by mouth in the evening with meals (Patient not taking: Reported on  08/29/2020) 90 tablet 6  . levETIRAcetam (KEPPRA) 500 MG tablet Take 2 tabs PO Q a.m and 1 tab Po Q p.m (Patient taking differently: Take 500-1,000 mg by mouth 2 (two) times daily. Take 2 tabs (1000mg ) PO Q a.m and 1 tab (500mg ) Po Q p.m) 90 tablet 6  . ondansetron (ZOFRAN ODT) 4 MG disintegrating tablet 4mg  ODT q4 hours prn nausea/vomit (Patient taking differently: Take 4 mg by mouth every 4 (four) hours as needed for nausea or vomiting. ) 20 tablet 0  . promethazine (PHENERGAN) 25 MG suppository Place 1 suppository (25 mg total) rectally every 6 (six) hours as needed for nausea or vomiting. 12 each 0    Allergies as of 09/02/2020  . (No Known Allergies)    Family History  Problem Relation Age of Onset  . Hypertension Other   . Seizures Other   . Seizures Maternal Grandmother     Social History   Socioeconomic History  . Marital status: Married    Spouse name: Dois DavenportSandra  . Number of children: 3  . Years of education: Not on file  . Highest education level: Not on file  Occupational History  . Not on file  Tobacco Use  . Smoking status: Current Every Day Smoker    Packs/day: 1.00    Years: 5.00    Pack years: 5.00    Types: Cigarettes  . Smokeless tobacco: Never Used  Vaping Use  . Vaping Use: Never used  Substance and Sexual Activity  . Alcohol use: Not Currently  . Drug use: Not Currently    Types: Marijuana  . Sexual activity: Not Currently  Other Topics Concern  . Not on file  Social History Narrative   History of seizures since childhood per patient   Social Determinants of Health   Financial Resource Strain:   . Difficulty of Paying Living Expenses: Not on file  Food Insecurity:   . Worried About Programme researcher, broadcasting/film/videounning Out of Food in the Last Year: Not on file  . Ran Out of Food in the Last Year: Not on file  Transportation Needs:   . Lack of Transportation (Medical): Not on file  . Lack of Transportation (Non-Medical): Not on file  Physical Activity:   . Days of Exercise  per Week: Not on file  . Minutes of Exercise per Session: Not on file  Stress:   . Feeling of Stress : Not on file  Social Connections:   . Frequency of Communication with Friends and Family: Not on file  . Frequency of Social Gatherings with Friends and Family: Not on file  . Attends Religious Services: Not on file  . Active Member of Clubs or Organizations: Not on file  . Attends BankerClub or Organization Meetings: Not on file  . Marital Status: Not on file  Intimate Partner Violence:   . Fear of Current or Ex-Partner: Not on  file  . Emotionally Abused: Not on file  . Physically Abused: Not on file  . Sexually Abused: Not on file    Review of Systems: Pertinent positive and negative review of systems were noted in the above HPI section.  All other review of systems was otherwise negative.  Physical Exam: Vital signs in last 24 hours: Temp:  [98.9 F (37.2 C)] 98.9 F (37.2 C) (12/06 0818) Pulse Rate:  [69-78] 69 (12/06 1300) Resp:  [16-18] 16 (12/06 1300) BP: (118-130)/(87-93) 130/91 (12/06 1300) SpO2:  [99 %-100 %] 99 % (12/06 1300) Weight:  [61.2 kg] 61.2 kg (12/06 0819)   General:   Alert,  Well-developed, well-nourished, African-American male cooperative in NAD Head:  Normocephalic and atraumatic. Eyes:  Sclera clear, no icterus.   Conjunctiva pink. Ears:  Normal auditory acuity. Nose:  No deformity, discharge,  or lesions. Mouth:  No deformity or lesions.   Neck:  Supple; no masses or thyromegaly. Lungs:  Clear throughout to auscultation.   No wheezes, crackles, or rhonchi.  Heart:  Regular rate and rhythm; no murmurs, clicks, rubs,  or gallops. Abdomen:  Soft, no focal tenderness, BS active, nonpalp mass or hsm. Small umbilical hernia Rectal:  Deferred  Msk:  Symmetrical without gross deformities. . Pulses:  Normal pulses noted. Extremities:  Without clubbing or edema. Neurologic:  Alert and  oriented x4;  grossly normal neurologically. Skin:  Intact without  significant lesions or rashes.. Psych:  Alert and cooperative. Normal mood and affect.  Intake/Output from previous day: No intake/output data recorded. Intake/Output this shift: No intake/output data recorded.  Lab Results: Recent Labs    09/02/20 1100  WBC 6.2  HGB 16.5  HCT 49.4  PLT 304   BMET Recent Labs    09/02/20 1017  NA 140  K 3.5  CL 104  CO2 28  GLUCOSE 92  BUN <5*  CREATININE 0.88  CALCIUM 9.3   LFT Recent Labs    09/02/20 1017  PROT 7.3  ALBUMIN 4.2  AST 23  ALT 25  ALKPHOS 51  BILITOT 0.6    IMPRESSION:  #46 34 year old African-American male with persistent complaints of daily nausea vomiting and abdominal pain with reportedly loss of 100 pounds over the past year or so. By previous notes has documented weight loss of 60 pounds since May 2021. Etiology is not clear, rule out peptic ulcer disease, chronic gastropathy, H. pylori induced gastropathy, gastroparesis, and also have high suspicion for cannabis hyperemesis syndrome.  #2 history of bipolar disorder #3 polysubstance abuse  Plan:  Clear liquid diet today, n.p.o. after midnight Start twice daily PPI IV Start around-the-clock Zofran 4 mg every 6 hours IV Covid test pending Urine drug screen. Patient will be scheduled for upper endoscopy with Dr. Rhea Belton in a.m. tomorrow. Procedure was discussed in detail with the patient including indications risks and benefits and he is agreeable to proceed. Further recommendations pending findings at EGD.   Amy EsterwoodPA-C  09/02/2020, 1:15 PM     Attending Physician Note   I have taken a history, examined the patient and reviewed the chart. I agree with the Advanced Practitioner's note, impression and recommendations.   *Daily nausea, vomiting, abdominal pain and weight loss of at least 60 lbs this year. Lipase mildly elevated at 171, uncertain significance. Last CT AP in May 2021 showed distal esophageal wall thickening, otherwise  negative. *Bipolar disorder *Polysubstance abuse.  *R/O ulcer, GERD, esophagitis, gastritis, gastroparesis, cannabinoid hyperemesis.   *PPI IV bid, Zofran q6h  IV RTC (not prn), urine drug screen pending. Covid screening was negative.  *Repeat lipase tomorrow.  *EGD tomorrow morning with Dr. Rhea Belton.  *If EGD negative consider GES, repeat CT AP.   Claudette Head, MD [facility]

## 2020-09-02 NOTE — ED Notes (Signed)
Patient is aware we need urine sample, does not feel he can provide one at this time.  Urinal at bedside.

## 2020-09-02 NOTE — Consult Note (Addendum)
Consultation  Referring Provider:  EDP / Dr. Fredderick Phenix Primary Care Physician:  [name], MD Primary Gastroenterologist:  Dr. Adela Lank  Reason for Consultation:  Nausea/vomiting, abdominal pain, weight loss  HPI: Greg Morales is a 34 y.o. male , who presented to the emergency room this morning with recurrent complaints of nausea vomiting and abdominal pain. Patient was initially seen in our office on 08/27/2020 by Doug Sou, PA-C with complaints of ongoing issues with daily nausea and vomiting and abdominal pain which had been going on long-term but worse over the past 6 months. Patient stated that he had had 100 pound weight loss over the past year or so. At that time per the notes he had related that he had tried PPIs in the past without any benefit. He had been evaluated by GI at [facility] in October 2021 and was to have EGD but did not follow through with that. He has had several prior CT scans, the last one in our system was done in May 2021 for nausea vomiting and abdominal pain and was negative other than a thickened distal esophagus. When he was seen in the office on 08/27/2020 per the notes he was moaning and crying and unable to give much history. For that reason he was admitted. At that time he had drug screen that was positive for THC, benzos, cocaine and amphetamines. Remainder of baseline labs unremarkable. He was scheduled to have a EGD on 08/28/2020 with Dr. Meridee Score. After admission he became agitated and aggressive and then expressed some suicidal ideation. Ultimately refused to have EGD and left AMA later that day, after cleared by psych and felt to be low risk.Marland Kitchen He had behavioral health assessment on 12/2 and 08/30/2020 and was seen by psych on 08/31/2020. Per those notes he does have history of bipolar disorder. He wanted to be discharged and was determined not to be at imminent risk. Patient denies any alcohol use, he denies any recreational drug use other than  marijuana and does admit to smoking marijuana long-term on a daily basis usually a couple of times per day. No use of aspirin Advil BCs Goody's etc.  Labs today in ER with CBC within normal limits, CMP unremarkable and lipase of 171.   Past Medical History:  Diagnosis Date  . Chronic abdominal pain   . Medically noncompliant   . Polysubstance abuse (HCC)   . Seizures (HCC)    since childhood    Past Surgical History:  Procedure Laterality Date  . ESOPHAGOGASTRODUODENOSCOPY      Prior to Admission medications   Medication Sig Start Date End Date Taking? Authorizing Provider  lamoTRIgine (LAMICTAL) 100 MG tablet Take 1 tablet (100 mg total) by mouth daily. Take 3 tablets by mouth in the evening with meals Patient not taking: Reported on 08/29/2020 05/24/18   [name], MD  levETIRAcetam (KEPPRA) 500 MG tablet Take 2 tabs PO Q a.m and 1 tab Po Q p.m Patient taking differently: Take 500-1,000 mg by mouth 2 (two) times daily. Take 2 tabs (1000mg ) PO Q a.m and 1 tab (500mg ) Po Q p.m 07/31/19   , MD  ondansetron (ZOFRAN ODT) 4 MG disintegrating tablet 4mg  ODT q4 hours prn nausea/vomit Patient taking differently: Take 4 mg by mouth every 4 (four) hours as needed for nausea or vomiting.  08/27/20   [name], DO  promethazine (PHENERGAN) 25 MG suppository Place 1 suppository (25 mg total) rectally every 6 (six) hours as  needed for nausea or vomiting. 08/27/20   Floyd, Dan, DO    Current Facility-Administered Medications  Medication Dose Route Frequency Provider Last Rate Last Admin  . capsaicin (ZOSTRIX) 0.025 % cream   Topical Once Belfi, Melanie, MD      . levETIRAcetam (KEPPRA) tablet 500 mg  500 mg Oral Once Belfi, Melanie, MD       Current Outpatient Medications  Medication Sig Dispense Refill  . lamoTRIgine (LAMICTAL) 100 MG tablet Take 1 tablet (100 mg total) by mouth daily. Take 3 tablets by mouth in the evening with meals (Patient not taking: Reported on  08/29/2020) 90 tablet 6  . levETIRAcetam (KEPPRA) 500 MG tablet Take 2 tabs PO Q a.m and 1 tab Po Q p.m (Patient taking differently: Take 500-1,000 mg by mouth 2 (two) times daily. Take 2 tabs (1000mg) PO Q a.m and 1 tab (500mg) Po Q p.m) 90 tablet 6  . ondansetron (ZOFRAN ODT) 4 MG disintegrating tablet 4mg ODT q4 hours prn nausea/vomit (Patient taking differently: Take 4 mg by mouth every 4 (four) hours as needed for nausea or vomiting. ) 20 tablet 0  . promethazine (PHENERGAN) 25 MG suppository Place 1 suppository (25 mg total) rectally every 6 (six) hours as needed for nausea or vomiting. 12 each 0    Allergies as of 09/02/2020  . (No Known Allergies)    Family History  Problem Relation Age of Onset  . Hypertension Other   . Seizures Other   . Seizures Maternal Grandmother     Social History   Socioeconomic History  . Marital status: Married    Spouse name: Sandra  . Number of children: 3  . Years of education: Not on file  . Highest education level: Not on file  Occupational History  . Not on file  Tobacco Use  . Smoking status: Current Every Day Smoker    Packs/day: 1.00    Years: 5.00    Pack years: 5.00    Types: Cigarettes  . Smokeless tobacco: Never Used  Vaping Use  . Vaping Use: Never used  Substance and Sexual Activity  . Alcohol use: Not Currently  . Drug use: Not Currently    Types: Marijuana  . Sexual activity: Not Currently  Other Topics Concern  . Not on file  Social History Narrative   History of seizures since childhood per patient   Social Determinants of Health   Financial Resource Strain:   . Difficulty of Paying Living Expenses: Not on file  Food Insecurity:   . Worried About Running Out of Food in the Last Year: Not on file  . Ran Out of Food in the Last Year: Not on file  Transportation Needs:   . Lack of Transportation (Medical): Not on file  . Lack of Transportation (Non-Medical): Not on file  Physical Activity:   . Days of Exercise  per Week: Not on file  . Minutes of Exercise per Session: Not on file  Stress:   . Feeling of Stress : Not on file  Social Connections:   . Frequency of Communication with Friends and Family: Not on file  . Frequency of Social Gatherings with Friends and Family: Not on file  . Attends Religious Services: Not on file  . Active Member of Clubs or Organizations: Not on file  . Attends Club or Organization Meetings: Not on file  . Marital Status: Not on file  Intimate Partner Violence:   . Fear of Current or Ex-Partner: Not on   file  . Emotionally Abused: Not on file  . Physically Abused: Not on file  . Sexually Abused: Not on file    Review of Systems: Pertinent positive and negative review of systems were noted in the above HPI section.  All other review of systems was otherwise negative.  Physical Exam: Vital signs in last 24 hours: Temp:  [98.9 F (37.2 C)] 98.9 F (37.2 C) (12/06 0818) Pulse Rate:  [69-78] 69 (12/06 1300) Resp:  [16-18] 16 (12/06 1300) BP: (118-130)/(87-93) 130/91 (12/06 1300) SpO2:  [99 %-100 %] 99 % (12/06 1300) Weight:  [61.2 kg] 61.2 kg (12/06 0819)   General:   Alert,  Well-developed, well-nourished, African-American male cooperative in NAD Head:  Normocephalic and atraumatic. Eyes:  Sclera clear, no icterus.   Conjunctiva pink. Ears:  Normal auditory acuity. Nose:  No deformity, discharge,  or lesions. Mouth:  No deformity or lesions.   Neck:  Supple; no masses or thyromegaly. Lungs:  Clear throughout to auscultation.   No wheezes, crackles, or rhonchi.  Heart:  Regular rate and rhythm; no murmurs, clicks, rubs,  or gallops. Abdomen:  Soft, no focal tenderness, BS active, nonpalp mass or hsm. Small umbilical hernia Rectal:  Deferred  Msk:  Symmetrical without gross deformities. . Pulses:  Normal pulses noted. Extremities:  Without clubbing or edema. Neurologic:  Alert and  oriented x4;  grossly normal neurologically. Skin:  Intact without  significant lesions or rashes.. Psych:  Alert and cooperative. Normal mood and affect.  Intake/Output from previous day: No intake/output data recorded. Intake/Output this shift: No intake/output data recorded.  Lab Results: Recent Labs    09/02/20 1100  WBC 6.2  HGB 16.5  HCT 49.4  PLT 304   BMET Recent Labs    09/02/20 1017  NA 140  K 3.5  CL 104  CO2 28  GLUCOSE 92  BUN <5*  CREATININE 0.88  CALCIUM 9.3   LFT Recent Labs    09/02/20 1017  PROT 7.3  ALBUMIN 4.2  AST 23  ALT 25  ALKPHOS 51  BILITOT 0.6    IMPRESSION:  #46 34 year old African-American male with persistent complaints of daily nausea vomiting and abdominal pain with reportedly loss of 100 pounds over the past year or so. By previous notes has documented weight loss of 60 pounds since May 2021. Etiology is not clear, rule out peptic ulcer disease, chronic gastropathy, H. pylori induced gastropathy, gastroparesis, and also have high suspicion for cannabis hyperemesis syndrome.  #2 history of bipolar disorder #3 polysubstance abuse  Plan:  Clear liquid diet today, n.p.o. after midnight Start twice daily PPI IV Start around-the-clock Zofran 4 mg every 6 hours IV Covid test pending Urine drug screen. Patient will be scheduled for upper endoscopy with Dr. Rhea Belton in a.m. tomorrow. Procedure was discussed in detail with the patient including indications risks and benefits and he is agreeable to proceed. Further recommendations pending findings at EGD.   Amy EsterwoodPA-C  09/02/2020, 1:15 PM     Attending Physician Note   I have taken a history, examined the patient and reviewed the chart. I agree with the Advanced Practitioner's note, impression and recommendations.   *Daily nausea, vomiting, abdominal pain and weight loss of at least 60 lbs this year. Lipase mildly elevated at 171, uncertain significance. Last CT AP in May 2021 showed distal esophageal wall thickening, otherwise  negative. *Bipolar disorder *Polysubstance abuse.  *R/O ulcer, GERD, esophagitis, gastritis, gastroparesis, cannabinoid hyperemesis.   *PPI IV bid, Zofran q6h  IV RTC (not prn), urine drug screen pending. Covid screening was negative.  *Repeat lipase tomorrow.  *EGD tomorrow morning with Dr. Rhea Belton.  *If EGD negative consider GES, repeat CT AP.   Claudette Head, MD [facility]

## 2020-09-02 NOTE — ED Notes (Addendum)
Urinal at bedside; pt aware sample is needed.

## 2020-09-02 NOTE — ED Triage Notes (Signed)
Patient states that he has been having abdominal pain and N/V since May and states it has been getting progressively worse.Marland Kitchen

## 2020-09-03 ENCOUNTER — Encounter (HOSPITAL_COMMUNITY)
Admission: EM | Disposition: A | Discharge status: Home / Self Care | Payer: Self-pay | Source: Home / Self Care | Attending: Emergency Medicine

## 2020-09-03 ENCOUNTER — Observation Stay (HOSPITAL_COMMUNITY): Payer: Self-pay | Admitting: Certified Registered Nurse Anesthetist

## 2020-09-03 ENCOUNTER — Encounter (HOSPITAL_COMMUNITY): Payer: Self-pay | Admitting: Internal Medicine

## 2020-09-03 DIAGNOSIS — K2970 Gastritis, unspecified, without bleeding: Secondary | ICD-10-CM

## 2020-09-03 DIAGNOSIS — R1013 Epigastric pain: Secondary | ICD-10-CM

## 2020-09-03 HISTORY — PX: ESOPHAGOGASTRODUODENOSCOPY (EGD) WITH PROPOFOL: SHX5813

## 2020-09-03 HISTORY — PX: BIOPSY: SHX5522

## 2020-09-03 LAB — LIPASE, BLOOD: Lipase: 41 U/L (ref 11–51)

## 2020-09-03 LAB — COMPREHENSIVE METABOLIC PANEL
ALT: 19 U/L (ref 0–44)
AST: 14 U/L — ABNORMAL LOW (ref 15–41)
Albumin: 3.4 g/dL — ABNORMAL LOW (ref 3.5–5.0)
Alkaline Phosphatase: 48 U/L (ref 38–126)
Anion gap: 10 (ref 5–15)
BUN: 6 mg/dL (ref 6–20)
CO2: 25 mmol/L (ref 22–32)
Calcium: 8.9 mg/dL (ref 8.9–10.3)
Chloride: 105 mmol/L (ref 98–111)
Creatinine, Ser: 0.94 mg/dL (ref 0.61–1.24)
GFR, Estimated: >60 mL/min (ref >60)
Glucose, Bld: 91 mg/dL (ref 70–99)
Potassium: 3.3 mmol/L — ABNORMAL LOW (ref 3.5–5.1)
Sodium: 140 mmol/L (ref 135–145)
Total Bilirubin: 0.8 mg/dL (ref 0.3–1.2)
Total Protein: 6.1 g/dL — ABNORMAL LOW (ref 6.5–8.1)

## 2020-09-03 LAB — CBC
HCT: 43.7 % (ref 39.0–52.0)
Hemoglobin: 14.4 g/dL (ref 13.0–17.0)
MCH: 31.6 pg (ref 26.0–34.0)
MCHC: 33 g/dL (ref 30.0–36.0)
MCV: 96 fL (ref 80.0–100.0)
Platelets: 257 10*3/uL (ref 150–400)
RBC: 4.55 MIL/uL (ref 4.22–5.81)
RDW: 13.3 % (ref 11.5–15.5)
WBC: 5.7 10*3/uL (ref 4.0–10.5)
nRBC: 0 % (ref 0.0–0.2)

## 2020-09-03 SURGERY — ESOPHAGOGASTRODUODENOSCOPY (EGD) WITH PROPOFOL
Anesthesia: Monitor Anesthesia Care

## 2020-09-03 MED ORDER — PANTOPRAZOLE SODIUM 40 MG PO TBEC: 40.0000 mg | DELAYED_RELEASE_TABLET | Freq: Every day | ORAL | 1 refills | Status: DC

## 2020-09-03 MED ORDER — LACTATED RINGERS IV SOLN: INTRAVENOUS | Status: DC | PRN

## 2020-09-03 MED ORDER — PANTOPRAZOLE SODIUM 40 MG PO TBEC
40.0000 mg | DELAYED_RELEASE_TABLET | Freq: Every day | ORAL | Status: DC
  Administered 2020-09-03: 12:00:00 40 mg via ORAL
  Filled 2020-09-03: qty 1

## 2020-09-03 MED ORDER — PROPOFOL 500 MG/50ML IV EMUL
INTRAVENOUS | Status: DC | PRN
  Administered 2020-09-03: 200 ug/kg/min via INTRAVENOUS

## 2020-09-03 MED ORDER — ONDANSETRON 4 MG PO TBDP: ORAL_TABLET | ORAL | 0 refills | Status: DC

## 2020-09-03 MED ORDER — LIDOCAINE 2% (20 MG/ML) 5 ML SYRINGE
INTRAMUSCULAR | Status: DC | PRN
  Administered 2020-09-03: 60 mg via INTRAVENOUS
  Administered 2020-09-03: 40 mg via INTRAVENOUS

## 2020-09-03 MED ORDER — PROPOFOL 10 MG/ML IV BOLUS
INTRAVENOUS | Status: DC | PRN
  Administered 2020-09-03: 20 mg via INTRAVENOUS
  Administered 2020-09-03: 30 mg via INTRAVENOUS
  Administered 2020-09-03: 50 mg via INTRAVENOUS

## 2020-09-03 SURGICAL SUPPLY — 15 items

## 2020-09-03 NOTE — Transfer of Care (Signed)
Immediate Anesthesia Transfer of Care Note  Patient: Greg Morales  Procedure(s) Performed: ESOPHAGOGASTRODUODENOSCOPY (EGD) WITH PROPOFOL (N/A ) BIOPSY  Patient Location: Endoscopy Unit  Anesthesia Type:MAC  Level of Consciousness: awake, alert , oriented and patient cooperative  Airway & Oxygen Therapy: Patient Spontanous Breathing and Patient connected to face mask oxygen  Post-op Assessment: Report given to RN, Post -op Vital signs reviewed and stable and Patient moving all extremities  Post vital signs: Reviewed and stable  Last Vitals:  Vitals Value Taken Time  BP 118/74 09/03/20 1116  Temp    Pulse 89 09/03/20 1119  Resp 22 09/03/20 1119  SpO2 100 % 09/03/20 1119  Vitals shown include unvalidated device data.  Last Pain:  Vitals:   09/03/20 1030  TempSrc: Oral  PainSc: 7       Patients Stated Pain Goal: 2 (72/62/03 5597)  Complications: No complications documented.

## 2020-09-03 NOTE — Progress Notes (Signed)
PROGRESS NOTE    Greg Morales  NAT:[id number] DOB: Sep 11, 1986 DOA: 09/02/2020 PCP: [name], MD   Chief Complaint  Patient presents with  . Abdominal Pain  . Emesis  . Diarrhea    Brief Narrative: 34 year old male with history of chronic abdominal pain presented with epigastric abdominal pain. As per the report he first noticed his problem back in May. It has been progressing since.  He presented to [facility] and [facility] for eval for has not completed w/u per his reports. He was set to have an EGD w/ [facility] at the beginning of the month, but he left AMA. He returns today with ongoing, sharp, non-radiating ab pain. He has tried tea and hot showers to alleviate the pain, but it has not helped. He reports no other alleviating or aggravating factors. He reports no other treatments.   In ED-EDP spoke with [facility] who recommended admission while they consult. [facility] was called for admission.   Subjective:  Abdominal pain present Waiting for endoscopy this morning  Assessment & Plan:  Intractable epigastric abdominal pain, appreciate GI input, continue PPI, Zofran supportive measures and possible EGD by GI.  He had CT abdomen pelvis with contrast in May/10/21 that showed thickening of the wall of the distal esophagus may be due to inflammatory changes.  Elevated lipase,?  Etiology, 117 on admission likely from #1 now resolved at 41.  Seizure disorder: He is on Keppra  Marijuana abuse: He has been counseled.  Urine drug screen positive for amphetamine, benzodiazepine, cocaine, THC negative polysubstance abuse.  TOC consult and outpatient resources  Nutrition: Diet Order            Diet NPO time specified  Diet effective midnight                 Body mass index is 22.47 kg/m.  DVT prophylaxis: SCDs Start: 09/02/20 2216 Code Status:   Code Status: Full Code  Family Communication: plan of care discussed with patient at bedside.  Status is: Observation The patient will require care  spanning > 2 midnights and should be moved to inpatient because: Further monitoring work-up with endoscopy and subsequently to advance diet as tolerated if recurrent symptoms will need further work-up . Dispo: The patient is from: Home              Anticipated d/c is to: Home              Anticipated d/c date is: 1 day              Patient currently is not medically stable to d/c.  Consultants:see note  Procedures:see note  Culture/Microbiology    Component Value Date/Time   SDES BLOOD RIGHT HAND 06/07/2010 2040   SPECREQUEST BOTTLES DRAWN AEROBIC ONLY 5CC 06/07/2010 2040   CULT NO GROWTH 5 DAYS 06/07/2010 2040   REPTSTATUS 06/14/2010 FINAL 06/07/2010 2040    Other culture-see note  Medications: Scheduled Meds: . capsaicin   Topical Once  . levETIRAcetam  1,000 mg Oral q morning - 10a  . levETIRAcetam  500 mg Oral QHS  . ondansetron  4 mg Intravenous Q6H  . pantoprazole (PROTONIX) IV  40 mg Intravenous Q12H   Continuous Infusions:  Antimicrobials: Anti-infectives (From admission, onward)   None     Objective: Vitals: Today's Vitals   09/02/20 2219 09/02/20 2231 09/03/20 0120 09/03/20 0546  BP:  124/79 107/79 114/71  Pulse:  (!) 59 (!) 56 (!) 59  Resp:  17 16  16  Temp:  98.3 F (36.8 C) 98.3 F (36.8 C)   TempSrc:  Oral Oral   SpO2:  100% 99% 99%  Weight:      Height:      PainSc: 10-Worst pain ever       Intake/Output Summary (Last 24 hours) at 09/03/2020 0850 Last data filed at 09/03/2020 0701 Gross per 24 hour  Intake 1000 ml  Output --  Net 1000 ml   Filed Weights   09/02/20 0819  Weight: 61.2 kg   Weight change:   Intake/Output from previous day: 12/06 0701 - 12/07 0700 In: 1000 [IV Piggyback:1000] Out: -  Intake/Output this shift: No intake/output data recorded.  Examination: General exam: AAO ,NAD, weak appearing. HEENT:Oral mucosa moist, Ear/Nose WNL grossly,dentition normal. Respiratory system: bilaterally clear,no wheezing or crackles,no  use of accessory muscle, non tender. Cardiovascular system: S1 & S2 +, regular, No JVD. Gastrointestinal system: Abdomen soft, NT,ND, BS+. Nervous System:Alert, awake, moving extremities and grossly nonfocal Extremities: No edema, distal peripheral pulses palpable.  Skin: No rashes,no icterus. MSK: Normal muscle bulk,tone, power  Data Reviewed: I have personally reviewed following labs and imaging studies CBC: Recent Labs  Lab 08/27/20 1025 08/28/20 0625 08/29/20 2347 09/02/20 1100 09/03/20 0533  WBC 8.2 8.1 8.7 6.2 5.7  NEUTROABS 7.2  --  5.2  --   --   HGB 15.6 14.2 14.3 16.5 14.4  HCT 46.7 43.1 43.9 49.4 43.7  MCV 95.1 96.4 98.7 95.4 96.0  PLT 342 282 281 304 257   Basic Metabolic Panel: Recent Labs  Lab 08/27/20 1025 08/28/20 0625 08/29/20 2347 09/02/20 1017 09/03/20 0533  NA 144 143 142 140 140  K 4.2 3.5 3.6 3.5 3.3*  CL 106 109 108 104 105  CO2 27 26 22 28 25   GLUCOSE 140* 95 71 92 91  BUN 10 10 9  <5* 6  CREATININE 1.20 0.99 1.08 0.88 0.94  CALCIUM 10.3 9.2 9.2 9.3 8.9  MG 1.9  --   --   --   --    GFR: Estimated Creatinine Clearance: 94.9 mL/min (by C-G formula based on SCr of 0.94 mg/dL). Liver Function Tests: Recent Labs  Lab 08/27/20 1025 08/29/20 2347 09/02/20 1017 09/03/20 0533  AST 24 28 23  14*  ALT 36 24 25 19   ALKPHOS 59 45 51 48  BILITOT 0.7 0.8 0.6 0.8  PROT 7.8 6.7 7.3 6.1*  ALBUMIN 4.4 3.7 4.2 3.4*   Recent Labs  Lab 09/02/20 1017 09/03/20 0533  LIPASE 171* 41   No results for input(s): AMMONIA in the last 168 hours. Coagulation Profile: No results for input(s): INR, PROTIME in the last 168 hours. Cardiac Enzymes: No results for input(s): CKTOTAL, CKMB, CKMBINDEX, TROPONINI in the last 168 hours. BNP (last 3 results) No results for input(s): PROBNP in the last 8760 hours. HbA1C: No results for input(s): HGBA1C in the last 72 hours. CBG: Recent Labs  Lab 08/27/20 1024  GLUCAP 142*   Lipid Profile: No results for  input(s): CHOL, HDL, LDLCALC, TRIG, CHOLHDL, LDLDIRECT in the last 72 hours. Thyroid Function Tests: No results for input(s): TSH, T4TOTAL, FREET4, T3FREE, THYROIDAB in the last 72 hours. Anemia Panel: No results for input(s): VITAMINB12, FOLATE, FERRITIN, TIBC, IRON, RETICCTPCT in the last 72 hours. Sepsis Labs: No results for input(s): PROCALCITON, LATICACIDVEN in the last 168 hours.  Recent Results (from the past 240 hour(s))  Resp Panel by RT-PCR (Flu A&B, Covid) Nasopharyngeal Swab     Status: None  Collection Time: 08/27/20  1:47 PM   Specimen: Nasopharyngeal Swab; Nasopharyngeal(NP) swabs in vial transport medium  Result Value Ref Range Status   SARS Coronavirus 2 by RT PCR NEGATIVE NEGATIVE Final    Comment: (NOTE) SARS-CoV-2 target nucleic acids are NOT DETECTED.  The SARS-CoV-2 RNA is generally detectable in upper respiratory specimens during the acute phase of infection. The lowest concentration of SARS-CoV-2 viral copies this assay can detect is 138 copies/mL. A negative result does not preclude SARS-Cov-2 infection and should not be used as the sole basis for treatment or other patient management decisions. A negative result may occur with  improper specimen collection/handling, submission of specimen other than nasopharyngeal swab, presence of viral mutation(s) within the areas targeted by this assay, and inadequate number of viral copies(<138 copies/mL). A negative result must be combined with clinical observations, patient history, and epidemiological information. The expected result is Negative.  Fact Sheet for Patients:  BloggerCourse.comhttps://www.fda.gov/media/152166/download  Fact Sheet for Healthcare Providers:  SeriousBroker.ithttps://www.fda.gov/media/152162/download  This test is no t yet approved or cleared by the [place] FDA and  has been authorized for detection and/or diagnosis of SARS-CoV-2 by FDA under an Emergency Use Authorization (EUA). This EUA will remain  in effect  (meaning this test can be used) for the duration of the COVID-19 declaration under Section 564(b)(1) of the Act, 21 U.S.C.section 360bbb-3(b)(1), unless the authorization is terminated  or revoked sooner.       Influenza A by PCR NEGATIVE NEGATIVE Final   Influenza B by PCR NEGATIVE NEGATIVE Final    Comment: (NOTE) The Xpert Xpress SARS-CoV-2/FLU/RSV plus assay is intended as an aid in the diagnosis of influenza from Nasopharyngeal swab specimens and should not be used as a sole basis for treatment. Nasal washings and aspirates are unacceptable for Xpert Xpress SARS-CoV-2/FLU/RSV testing.  Fact Sheet for Patients: BloggerCourse.comhttps://www.fda.gov/media/152166/download  Fact Sheet for Healthcare Providers: SeriousBroker.ithttps://www.fda.gov/media/152162/download  This test is not yet approved or cleared by the [place] FDA and has been authorized for detection and/or diagnosis of SARS-CoV-2 by FDA under an Emergency Use Authorization (EUA). This EUA will remain in effect (meaning this test can be used) for the duration of the COVID-19 declaration under Section 564(b)(1) of the Act, 21 U.S.C. section 360bbb-3(b)(1), unless the authorization is terminated or revoked.  Performed at [facility], [address] [place], [place] [address]   Resp Panel by RT-PCR (Flu A&B, Covid) Nasopharyngeal Swab     Status: None   Collection Time: 09/02/20  1:27 PM   Specimen: Nasopharyngeal Swab; Nasopharyngeal(NP) swabs in vial transport medium  Result Value Ref Range Status   SARS Coronavirus 2 by RT PCR NEGATIVE NEGATIVE Final    Comment: (NOTE) SARS-CoV-2 target nucleic acids are NOT DETECTED.  The SARS-CoV-2 RNA is generally detectable in upper respiratory specimens during the acute phase of infection. The lowest concentration of SARS-CoV-2 viral copies this assay can detect is 138 copies/mL. A negative result does not preclude SARS-Cov-2 infection and should not be used as the sole basis  for treatment or other patient management decisions. A negative result may occur with  improper specimen collection/handling, submission of specimen other than nasopharyngeal swab, presence of viral mutation(s) within the areas targeted by this assay, and inadequate number of viral copies(<138 copies/mL). A negative result must be combined with clinical observations, patient history, and epidemiological information. The expected result is Negative.  Fact Sheet for Patients:  BloggerCourse.comhttps://www.fda.gov/media/152166/download  Fact Sheet for Healthcare Providers:  SeriousBroker.ithttps://www.fda.gov/media/152162/download  This test  is no t yet approved or cleared by the [place] and  has been authorized for detection and/or diagnosis of SARS-CoV-2 by FDA under an Emergency Use Authorization (EUA). This EUA will remain  in effect (meaning this test can be used) for the duration of the COVID-19 declaration under Section 564(b)(1) of the Act, 21 U.S.C.section 360bbb-3(b)(1), unless the authorization is terminated  or revoked sooner.       Influenza A by PCR NEGATIVE NEGATIVE Final   Influenza B by PCR NEGATIVE NEGATIVE Final    Comment: (NOTE) The Xpert Xpress SARS-CoV-2/FLU/RSV plus assay is intended as an aid in the diagnosis of influenza from Nasopharyngeal swab specimens and should not be used as a sole basis for treatment. Nasal washings and aspirates are unacceptable for Xpert Xpress SARS-CoV-2/FLU/RSV testing.  Fact Sheet for Patients: BloggerCourse.com  Fact Sheet for Healthcare Providers: SeriousBroker.it  This test is not yet approved or cleared by the [place] FDA and has been authorized for detection and/or diagnosis of SARS-CoV-2 by FDA under an Emergency Use Authorization (EUA). This EUA will remain in effect (meaning this test can be used) for the duration of the COVID-19 declaration under Section 564(b)(1) of the Act, 21  U.S.C. section 360bbb-3(b)(1), unless the authorization is terminated or revoked.  Performed at [facility], [address], [place], [place] [address]      Radiology Studies: No results found.   LOS: 0 days   Lanae Boast, MD [facility]  09/03/2020, 8:50 AM

## 2020-09-03 NOTE — Anesthesia Procedure Notes (Signed)
Procedure Name: MAC Date/Time: 09/03/2020 10:53 AM Performed by: Mitzie Na, CRNA Pre-anesthesia Checklist: Patient identified, Emergency Drugs available, Suction available, Patient being monitored and Timeout performed Oxygen Delivery Method: Simple face mask Placement Confirmation: positive ETCO2 and breath sounds checked- equal and bilateral Dental Injury: Teeth and Oropharynx as per pre-operative assessment

## 2020-09-03 NOTE — Anesthesia Preprocedure Evaluation (Addendum)
Anesthesia Evaluation  Patient identified by MRN, date of birth, ID band Patient awake    Reviewed: Allergy & Precautions, NPO status , Patient's Chart, lab work & pertinent test results  History of Anesthesia Complications Negative for: history of anesthetic complications  Airway Mallampati: II  TM Distance: >3 FB Neck ROM: Full    Dental  (+) Dental Advisory Given   Pulmonary neg shortness of breath, neg sleep apnea, neg COPD, neg recent URI, Current Smoker and Patient abstained from smoking.,  Covid-19 Nucleic Acid Test Results Lab Results      Component                Value               Date                      SARSCOV2NAA              NEGATIVE            09/02/2020                Violet              NEGATIVE            08/27/2020                Thiells              Not Detected        04/22/2020              breath sounds clear to auscultation       Cardiovascular negative cardio ROS   Rhythm:Regular     Neuro/Psych Seizures -,  Last grand mal ~ 4 days ago    GI/Hepatic negative GI ROS, Neg liver ROS,   Endo/Other  negative endocrine ROS  Renal/GU negative Renal ROSLab Results      Component                Value               Date                      CREATININE               0.94                09/03/2020                Musculoskeletal negative musculoskeletal ROS (+)   Abdominal   Peds  Hematology negative hematology ROS (+) Lab Results      Component                Value               Date                      WBC                      5.7                 09/03/2020                HGB                      14.4  09/03/2020                HCT                      43.7                09/03/2020                MCV                      96.0                09/03/2020                PLT                      257                 09/03/2020              Anesthesia Other Findings    Reproductive/Obstetrics                            Anesthesia Physical Anesthesia Plan  ASA: III  Anesthesia Plan: MAC   Post-op Pain Management:    Induction: Intravenous  PONV Risk Score and Plan: Propofol infusion and Treatment may vary due to age or medical condition  Airway Management Planned: Nasal Cannula  Additional Equipment: None  Intra-op Plan:   Post-operative Plan:   Informed Consent:   Plan Discussed with:   Anesthesia Plan Comments:         Anesthesia Quick Evaluation

## 2020-09-03 NOTE — Op Note (Signed)
[facility] Patient Name: Greg Morales Procedure Date: 09/03/2020 MRN: [id number] Attending MD: Beverley Fiedler , MD Date of Birth: 1986/03/27 CSN: [id number] Age: 34 Admit Type: Inpatient Procedure:                Upper GI endoscopy Indications:              Epigastric abdominal pain, Nausea with vomiting Providers:                Carie Caddy. Rhea Belton, MD, Dwain Sarna, RN, Harrington Challenger,                            Technician, Rosilyn Mings, Technician, Steffanie Dunn CRNA Referring MD:             [facility] Medicines:                Monitored Anesthesia Care Complications:            No immediate complications. Estimated Blood Loss:     Estimated blood loss was minimal. Procedure:                Pre-Anesthesia Assessment:                           - Prior to the procedure, a History and Physical                            was performed, and patient medications and                            allergies were reviewed. The patient's tolerance of                            previous anesthesia was also reviewed. The risks                            and benefits of the procedure and the sedation                            options and risks were discussed with the patient.                            All questions were answered, and informed consent                            was obtained. Prior Anticoagulants: The patient has                            taken no previous anticoagulant or antiplatelet                            agents. ASA Grade Assessment: III - A patient with  severe systemic disease. After reviewing the risks                            and benefits, the patient was deemed in                            satisfactory condition to undergo the procedure.                           After obtaining informed consent, the endoscope was                            passed under direct vision. Throughout the                             procedure, the patient's blood pressure, pulse, and                            oxygen saturations were monitored continuously. The                            GIF-H190 ([id number]) Olympus gastroscope was                            introduced through the mouth, and advanced to the                            second part of duodenum. The upper GI endoscopy was                            accomplished without difficulty. The patient                            tolerated the procedure well. Scope In: Scope Out: Findings:      Normal mucosa was found in the entire esophagus. The lower esophageal       sphincter was somewhat hypertonic. There was moderate resistance to       endoscope advancement into the stomach. No stricture or ring was seen.       The Z-line was regular. The gastroesophageal junction and cardia were       normal on retroflexed view.      Mild inflammation characterized by erythema was found in the gastric       antrum. Biopsies were taken with a cold forceps for histology and       Helicobacter pylori testing.      The examined duodenum was normal. Biopsies were taken with a cold       forceps for histology. Impression:               - Normal mucosa was found in the entire esophagus.                            Hypertonic LES noted, nonspecific. No stricture  seen. Z-line regular.                           - Gastritis. Biopsied.                           - Normal examined duodenum. Biopsied. Moderate Sedation:      N/A Recommendation:           - Return patient to hospital ward for ongoing care.                           - Advance diet as tolerated.                           - Continue present medications.                           - Await pathology results.                           - If persistent symptoms then 4-hr gastric emptying                            scan is recommended. Procedure Code(s):        --- Professional  ---                           [phone], Esophagogastroduodenoscopy, flexible,                            transoral; with biopsy, single or multiple Diagnosis Code(s):        --- Professional ---                           K29.70, Gastritis, unspecified, without bleeding                           R10.13, Epigastric pain                           R11.2, Nausea with vomiting, unspecified CPT copyright 2019 American Medical Association. All rights reserved. The codes documented in this report are preliminary and upon coder review may  be revised to meet current compliance requirements. Beverley Fiedler, MD 09/03/2020 11:15:33 AM This report has been signed electronically. Number of Addenda: 0

## 2020-09-03 NOTE — Progress Notes (Signed)
Notified patient that MD order him to have a soft diet after he tolerates this he can go home. Patient started yelling at writer he is pulling his IV out now and he isn't going to wait.

## 2020-09-03 NOTE — Interval H&P Note (Signed)
History and Physical Interval Note: For EGD today to eval n/v and upper abd pain. The nature of the procedure, as well as the risks, benefits, and alternatives were carefully and thoroughly reviewed with the patient. Ample time for discussion and questions allowed. The patient understood, was satisfied, and agreed to proceed.    09/03/2020 10:40 AM  Greg Morales  has presented today for surgery, with the diagnosis of nausea, vomiting, weight loss.  The various methods of treatment have been discussed with the patient and family. After consideration of risks, benefits and other options for treatment, the patient has consented to  Procedure(s): ESOPHAGOGASTRODUODENOSCOPY (EGD) WITH PROPOFOL (N/A) as a surgical intervention.  The patient's history has been reviewed, patient examined, no change in status, stable for surgery.  I have reviewed the patient's chart and labs.  Questions were answered to the patient's satisfaction.     Carie Caddy [name]

## 2020-09-03 NOTE — Discharge Summary (Signed)
Physician Discharge Summary  Greg Morales RCV:[id number] DOB: November 01, 1985 DOA: 09/02/2020  PCP: [name], MD  Admit date: 09/02/2020 Discharge date: 09/03/2020  Admitted From: HOME Disposition:  HOME  Recommendations for Outpatient Follow-up:  1. Follow up with PCP in 1-2 weeks 2. Please obtain BMP/CBC in one week 3. Please follow up on the following pending results: biopsy from EGD  Home Health:no  Equipment/Devices: none  Discharge Condition: Stable Code Status:   Code Status: Full Code Diet recommendation:  Diet Order            Diet full liquid Room service appropriate? Yes; Fluid consistency: Thin  Diet effective now                  Brief/Interim Summary: 34 year old male with history of chronic abdominal pain presented with epigastric abdominal pain. As per the report he first noticed his problem back in May. It has been progressing since. He presented to [facility] and [facility] for eval for has not completed w/u per his reports. He was set to have an EGD w/ [facility] at the beginning of the month, but he left AMA. He returns today with ongoing, sharp, non-radiating ab pain. He has tried tea and hot showers to alleviate the pain, but it has not helped. He reports no other alleviating or aggravating factors. He reports no other treatments.   In ED-EDP spoke with [facility] who recommended admission while they consult. [facility] was called for admission. S/p EGD- nomrla, biopsy done. Gi advised d/c home if tolerates diet. If he has recurrence he may needs GES Cont on ppi and zofran as OP   Discharge Diagnoses:  Intractable epigastric abdominal pain, appreciate GI input S/p EGD- nomal, biopsy done. Gi advised d/c home if tolerates diet. If he has recurrence he may needs GES.  Cont on ppi and zofran as OP,  Elevated lipase,?  Etiology, 117 on admission likely from #1 now resolved at 41.  Seizure disorder: He is on Keppra  Marijuana abuse: He has been counseled.  Urine drug screen  positive for amphetamine, benzodiazepine, cocaine, THC negative polysubstance abuse.  TOC consult and outpatient resources Consults: GI  Subjective:  RESTING WELL. NO COMPLAINTS   Discharge Exam: Vitals:   09/03/20 1130 09/03/20 1155  BP: 125/90 98/63  Pulse: 75 73  Resp: 20 18  Temp:  98.4 F (36.9 C)  SpO2: 99% 100%   General: Pt is alert, awake, not in acute distress Cardiovascular: RRR, S1/S2 +, no rubs, no gallops Respiratory: CTA bilaterally, no wheezing, no rhonchi Abdominal: Soft, NT, ND, bowel sounds + Extremities: no edema, no cyanosis  Discharge Instructions  Discharge Instructions    Discharge instructions   Complete by: As directed    Please call call MD or return to ER for similar or worsening recurring problem that brought you to hospital or if any fever,nausea/vomiting,abdominal pain, uncontrolled pain, chest pain,  shortness of breath or any other alarming symptoms.  Please follow-up your doctor as instructed in a week time and call the office for appointment.  Please avoid alcohol, smoking, or any other illicit substance and maintain healthy habits including taking your regular medications as prescribed.  You were cared for by a hospitalist during your hospital stay. If you have any questions about your discharge medications or the care you received while you were in the hospital after you are discharged, you can call the unit and ask to speak with the hospitalist on call if the hospitalist that took  care of you is not available.  Once you are discharged, your primary care physician will handle any further medical issues. Please note that NO REFILLS for any discharge medications will be authorized once you are discharged, as it is imperative that you return to your primary care physician (or establish a relationship with a primary care physician if you do not have one) for your aftercare needs so that they can reassess your need for medications and monitor your  lab values   Increase activity slowly   Complete by: As directed      Allergies as of 09/03/2020   No Known Allergies     Medication List    TAKE these medications   lamoTRIgine 100 MG tablet Commonly known as: LAMICTAL Take 1 tablet (100 mg total) by mouth daily. Take 3 tablets by mouth in the evening with meals   levETIRAcetam 500 MG tablet Commonly known as: KEPPRA Take 2 tabs PO Q a.m and 1 tab Po Q p.m What changed:   how much to take  how to take this  when to take this  additional instructions   ondansetron 4 MG disintegrating tablet Commonly known as: Zofran ODT 4mg  ODT q4 hours prn nausea/vomit   pantoprazole 40 MG tablet Commonly known as: PROTONIX Take 1 tablet (40 mg total) by mouth daily at 6 (six) AM. Start taking on: September 04, 2020   promethazine 25 MG suppository Commonly known as: PHENERGAN Place 1 suppository (25 mg total) rectally every 6 (six) hours as needed for nausea or vomiting.       Follow-up Information    Marcine MatarJohnson, Deborah B, MD Follow up in 1 week(s).   Specialty: Internal Medicine Contact information: [address] [place] [id number] [phone]              No Known Allergies  The results of significant diagnostics from this hospitalization (including imaging, microbiology, ancillary and laboratory) are listed below for reference.    Microbiology: Recent Results (from the past 240 hour(s))  Resp Panel by RT-PCR (Flu A&B, Covid) Nasopharyngeal Swab     Status: None   Collection Time: 08/27/20  1:47 PM   Specimen: Nasopharyngeal Swab; Nasopharyngeal(NP) swabs in vial transport medium  Result Value Ref Range Status   SARS Coronavirus 2 by RT PCR NEGATIVE NEGATIVE Final    Comment: (NOTE) SARS-CoV-2 target nucleic acids are NOT DETECTED.  The SARS-CoV-2 RNA is generally detectable in upper respiratory specimens during the acute phase of infection. The lowest concentration of SARS-CoV-2 viral copies this assay  can detect is 138 copies/mL. A negative result does not preclude SARS-Cov-2 infection and should not be used as the sole basis for treatment or other patient management decisions. A negative result may occur with  improper specimen collection/handling, submission of specimen other than nasopharyngeal swab, presence of viral mutation(s) within the areas targeted by this assay, and inadequate number of viral copies(<138 copies/mL). A negative result must be combined with clinical observations, patient history, and epidemiological information. The expected result is Negative.  Fact Sheet for Patients:  BloggerCourse.comhttps://www.fda.gov/media/152166/download  Fact Sheet for Healthcare Providers:  SeriousBroker.ithttps://www.fda.gov/media/152162/download  This test is no t yet approved or cleared by the [place] FDA and  has been authorized for detection and/or diagnosis of SARS-CoV-2 by FDA under an Emergency Use Authorization (EUA). This EUA will remain  in effect (meaning this test can be used) for the duration of the COVID-19 declaration under Section 564(b)(1) of the Act, 21 U.S.C.section 360bbb-3(b)(1),  unless the authorization is terminated  or revoked sooner.       Influenza A by PCR NEGATIVE NEGATIVE Final   Influenza B by PCR NEGATIVE NEGATIVE Final    Comment: (NOTE) The Xpert Xpress SARS-CoV-2/FLU/RSV plus assay is intended as an aid in the diagnosis of influenza from Nasopharyngeal swab specimens and should not be used as a sole basis for treatment. Nasal washings and aspirates are unacceptable for Xpert Xpress SARS-CoV-2/FLU/RSV testing.  Fact Sheet for Patients: BloggerCourse.com  Fact Sheet for Healthcare Providers: SeriousBroker.it  This test is not yet approved or cleared by the [place] FDA and has been authorized for detection and/or diagnosis of SARS-CoV-2 by FDA under an Emergency Use Authorization (EUA). This EUA will  remain in effect (meaning this test can be used) for the duration of the COVID-19 declaration under Section 564(b)(1) of the Act, 21 U.S.C. section 360bbb-3(b)(1), unless the authorization is terminated or revoked.  Performed at [facility], [address] [place], [place] [address]   Resp Panel by RT-PCR (Flu A&B, Covid) Nasopharyngeal Swab     Status: None   Collection Time: 09/02/20  1:27 PM   Specimen: Nasopharyngeal Swab; Nasopharyngeal(NP) swabs in vial transport medium  Result Value Ref Range Status   SARS Coronavirus 2 by RT PCR NEGATIVE NEGATIVE Final    Comment: (NOTE) SARS-CoV-2 target nucleic acids are NOT DETECTED.  The SARS-CoV-2 RNA is generally detectable in upper respiratory specimens during the acute phase of infection. The lowest concentration of SARS-CoV-2 viral copies this assay can detect is 138 copies/mL. A negative result does not preclude SARS-Cov-2 infection and should not be used as the sole basis for treatment or other patient management decisions. A negative result may occur with  improper specimen collection/handling, submission of specimen other than nasopharyngeal swab, presence of viral mutation(s) within the areas targeted by this assay, and inadequate number of viral copies(<138 copies/mL). A negative result must be combined with clinical observations, patient history, and epidemiological information. The expected result is Negative.  Fact Sheet for Patients:  BloggerCourse.com  Fact Sheet for Healthcare Providers:  SeriousBroker.it  This test is no t yet approved or cleared by the [place] FDA and  has been authorized for detection and/or diagnosis of SARS-CoV-2 by FDA under an Emergency Use Authorization (EUA). This EUA will remain  in effect (meaning this test can be used) for the duration of the COVID-19 declaration under Section 564(b)(1) of the Act,  21 U.S.C.section 360bbb-3(b)(1), unless the authorization is terminated  or revoked sooner.       Influenza A by PCR NEGATIVE NEGATIVE Final   Influenza B by PCR NEGATIVE NEGATIVE Final    Comment: (NOTE) The Xpert Xpress SARS-CoV-2/FLU/RSV plus assay is intended as an aid in the diagnosis of influenza from Nasopharyngeal swab specimens and should not be used as a sole basis for treatment. Nasal washings and aspirates are unacceptable for Xpert Xpress SARS-CoV-2/FLU/RSV testing.  Fact Sheet for Patients: BloggerCourse.com  Fact Sheet for Healthcare Providers: SeriousBroker.it  This test is not yet approved or cleared by the [place] FDA and has been authorized for detection and/or diagnosis of SARS-CoV-2 by FDA under an Emergency Use Authorization (EUA). This EUA will remain in effect (meaning this test can be used) for the duration of the COVID-19 declaration under Section 564(b)(1) of the Act, 21 U.S.C. section 360bbb-3(b)(1), unless the authorization is terminated or revoked.  Performed at [facility], [address] [place], [place] [address]  Procedures/Studies: No results found.  Labs: BNP (last 3 results) No results for input(s): BNP in the last 8760 hours. Basic Metabolic Panel: Recent Labs  Lab 08/28/20 0625 08/29/20 2347 09/02/20 1017 09/03/20 0533  NA 143 142 140 140  K 3.5 3.6 3.5 3.3*  CL 109 108 104 105  CO2 GLUCOSE 95 71 92 91  BUN 10 9 <5* 6  CREATININE 0.99 1.08 0.88 0.94  CALCIUM 9.2 9.2 9.3 8.9   Liver Function Tests: Recent Labs  Lab 08/29/20 2347 09/02/20 1017 09/03/20 0533  AST 28 23 14*  ALT ALKPHOS 45 51 48  BILITOT 0.8 0.6 0.8  PROT 6.7 7.3 6.1*  ALBUMIN 3.7 4.2 3.4*   Recent Labs  Lab 09/02/20 1017 09/03/20 0533  LIPASE 171* 41   No results for input(s): AMMONIA in the last 168 hours. CBC: Recent Labs  Lab  08/28/20 0625 08/29/20 2347 09/02/20 1100 09/03/20 0533  WBC 8.1 8.7 6.2 5.7  NEUTROABS  --  5.2  --   --   HGB 14.2 14.3 16.5 14.4  HCT 43.1 43.9 49.4 43.7  MCV 96.4 98.7 95.4 96.0  PLT 282 281 304 257   Cardiac Enzymes: No results for input(s): CKTOTAL, CKMB, CKMBINDEX, TROPONINI in the last 168 hours. BNP: Invalid input(s): POCBNP CBG: No results for input(s): GLUCAP in the last 168 hours. D-Dimer No results for input(s): DDIMER in the last 72 hours. Hgb A1c No results for input(s): HGBA1C in the last 72 hours. Lipid Profile No results for input(s): CHOL, HDL, LDLCALC, TRIG, CHOLHDL, LDLDIRECT in the last 72 hours. Thyroid function studies No results for input(s): TSH, T4TOTAL, T3FREE, THYROIDAB in the last 72 hours.  Invalid input(s): FREET3 Anemia work up No results for input(s): VITAMINB12, FOLATE, FERRITIN, TIBC, IRON, RETICCTPCT in the last 72 hours. Urinalysis    Component Value Date/Time   COLORURINE YELLOW 09/02/2020 1900   APPEARANCEUR CLEAR 09/02/2020 1900   LABSPEC 1.025 09/02/2020 1900   PHURINE 5.0 09/02/2020 1900   GLUCOSEU NEGATIVE 09/02/2020 1900   HGBUR NEGATIVE 09/02/2020 1900   BILIRUBINUR NEGATIVE 09/02/2020 1900   KETONESUR NEGATIVE 09/02/2020 1900   PROTEINUR NEGATIVE 09/02/2020 1900   UROBILINOGEN 1.0 01/27/2015 0907   NITRITE NEGATIVE 09/02/2020 1900   LEUKOCYTESUR NEGATIVE 09/02/2020 1900   Sepsis Labs Invalid input(s): PROCALCITONIN,  WBC,  LACTICIDVEN Microbiology Recent Results (from the past 240 hour(s))  Resp Panel by RT-PCR (Flu A&B, Covid) Nasopharyngeal Swab     Status: None   Collection Time: 08/27/20  1:47 PM   Specimen: Nasopharyngeal Swab; Nasopharyngeal(NP) swabs in vial transport medium  Result Value Ref Range Status   SARS Coronavirus 2 by RT PCR NEGATIVE NEGATIVE Final    Comment: (NOTE) SARS-CoV-2 target nucleic acids are NOT DETECTED.  The SARS-CoV-2 RNA is generally detectable in upper respiratory specimens  during the acute phase of infection. The lowest concentration of SARS-CoV-2 viral copies this assay can detect is 138 copies/mL. A negative result does not preclude SARS-Cov-2 infection and should not be used as the sole basis for treatment or other patient management decisions. A negative result may occur with  improper specimen collection/handling, submission of specimen other than nasopharyngeal swab, presence of viral mutation(s) within the areas targeted by this assay, and inadequate number of viral copies(<138 copies/mL). A negative result must be combined with clinical observations, patient history, and epidemiological information. The expected result is Negative.  Fact Sheet for Patients:  BloggerCourse.com  Fact Sheet for Healthcare Providers:  SeriousBroker.it  This test is no t yet approved or cleared by the [place] FDA and  has been authorized for detection and/or diagnosis of SARS-CoV-2 by FDA under an Emergency Use Authorization (EUA). This EUA will remain  in effect (meaning this test can be used) for the duration of the COVID-19 declaration under Section 564(b)(1) of the Act, 21 U.S.C.section 360bbb-3(b)(1), unless the authorization is terminated  or revoked sooner.       Influenza A by PCR NEGATIVE NEGATIVE Final   Influenza B by PCR NEGATIVE NEGATIVE Final    Comment: (NOTE) The Xpert Xpress SARS-CoV-2/FLU/RSV plus assay is intended as an aid in the diagnosis of influenza from Nasopharyngeal swab specimens and should not be used as a sole basis for treatment. Nasal washings and aspirates are unacceptable for Xpert Xpress SARS-CoV-2/FLU/RSV testing.  Fact Sheet for Patients: BloggerCourse.com  Fact Sheet for Healthcare Providers: SeriousBroker.it  This test is not yet approved or cleared by the [place] FDA and has been authorized for detection  and/or diagnosis of SARS-CoV-2 by FDA under an Emergency Use Authorization (EUA). This EUA will remain in effect (meaning this test can be used) for the duration of the COVID-19 declaration under Section 564(b)(1) of the Act, 21 U.S.C. section 360bbb-3(b)(1), unless the authorization is terminated or revoked.  Performed at [facility], [address], [place], [place] [address]   Resp Panel by RT-PCR (Flu A&B, Covid) Nasopharyngeal Swab     Status: None   Collection Time: 09/02/20  1:27 PM   Specimen: Nasopharyngeal Swab; Nasopharyngeal(NP) swabs in vial transport medium  Result Value Ref Range Status   SARS Coronavirus 2 by RT PCR NEGATIVE NEGATIVE Final    Comment: (NOTE) SARS-CoV-2 target nucleic acids are NOT DETECTED.  The SARS-CoV-2 RNA is generally detectable in upper respiratory specimens during the acute phase of infection. The lowest concentration of SARS-CoV-2 viral copies this assay can detect is 138 copies/mL. A negative result does not preclude SARS-Cov-2 infection and should not be used as the sole basis for treatment or other patient management decisions. A negative result may occur with  improper specimen collection/handling, submission of specimen other than nasopharyngeal swab, presence of viral mutation(s) within the areas targeted by this assay, and inadequate number of viral copies(<138 copies/mL). A negative result must be combined with clinical observations, patient history, and epidemiological information. The expected result is Negative.  Fact Sheet for Patients:  BloggerCourse.com  Fact Sheet for Healthcare Providers:  SeriousBroker.it  This test is no t yet approved or cleared by the [place] FDA and  has been authorized for detection and/or diagnosis of SARS-CoV-2 by FDA under an Emergency Use Authorization (EUA). This EUA will remain  in effect (meaning this test can be  used) for the duration of the COVID-19 declaration under Section 564(b)(1) of the Act, 21 U.S.C.section 360bbb-3(b)(1), unless the authorization is terminated  or revoked sooner.       Influenza A by PCR NEGATIVE NEGATIVE Final   Influenza B by PCR NEGATIVE NEGATIVE Final    Comment: (NOTE) The Xpert Xpress SARS-CoV-2/FLU/RSV plus assay is intended as an aid in the diagnosis of influenza from Nasopharyngeal swab specimens and should not be used as a sole basis for treatment. Nasal washings and aspirates are unacceptable for Xpert Xpress SARS-CoV-2/FLU/RSV testing.  Fact Sheet for Patients: BloggerCourse.com  Fact Sheet for Healthcare Providers: SeriousBroker.it  This test is not yet approved or cleared by the [place] FDA and has been authorized for detection and/or diagnosis of SARS-CoV-2 by FDA under an Emergency Use Authorization (EUA). This EUA will remain in effect (meaning this test can be used) for the duration of the COVID-19 declaration under Section 564(b)(1) of the Act, 21 U.S.C. section 360bbb-3(b)(1), unless the authorization is terminated or revoked.  Performed at [facility], [address] [place], [place] [address]      Time coordinating discharge: 25 minutes  SIGNED: Lanae Boast, MD  [facility] 09/03/2020, 2:15 PM  If 7PM-7AM, please contact night-coverage [email or web address]

## 2020-09-04 ENCOUNTER — Encounter: Payer: Self-pay | Admitting: Internal Medicine

## 2020-09-04 ENCOUNTER — Other Ambulatory Visit: Payer: Self-pay

## 2020-09-04 ENCOUNTER — Telehealth: Payer: Self-pay

## 2020-09-04 LAB — SURGICAL PATHOLOGY

## 2020-09-04 NOTE — Telephone Encounter (Signed)
Transition Care Management Follow-up Telephone Call  Date of discharge and from where: 09/03/2020, [facility]   How have you been since you were released from the hospital? He said he is feeling okay; but would like to get back on his behavioral health medications; but he could not remember what meds he was taking. He said at one point [facility] prescribed these medications for him,  He said that he used to go to [facility] but they told him that he would need to pay for the services  He has contacted the [facility] and was told that they are only taking walk ins at 0745 and there is no open appointments for months.  Explained to him that [facility] has an LCSW on staff and he may benefit from speaking with her.  Any questions or concerns? Yes - noted above  He said as of 09/28/2020, he will have insurance.   Items Reviewed:  Did the pt receive and understand the discharge instructions provided? Yes   Medications obtained and verified? No  -he said that he does not have any medications and he is not sure what pharmacy he uses.  Explained to him the importance of getting/taking his medications and that he may be eligible for a one time free fill at [facility] pharmacy. He will plan to see Dr Laural Benes tomorrow at [facility] and will discuss medication needs at that time,   Other? No   Any new allergies since your discharge? No   Do you have support at home? No , lives alone  Home Care and Equipment/Supplies: Were home health services ordered?  NO If so, what is the name of the agency? N/A Has the agency set up a time to come to the patient's home? n/a Were any new equipment or medical supplies ordered?  No What is the name of the medical supply agency? N/A Were you able to get the supplies/equipment?  N/A Do you have any questions related to the use of the equipment or supplies? No  Functional Questionnaire: (I = Independent and D = Dependent) ADLs: independent  Follow up appointments  reviewed:   PCP Hospital f/u appt confirmed? Yes  - Dr Laural Benes 09/05/2020   Specialist Hospital f/u appt confirmed? No  - none scheduled at this time  Are transportation arrangements needed? No   If their condition worsens, is the pt aware to call PCP or go to the Emergency Dept.?  yes  Was the patient provided with contact information for the PCP's office or ED?  he said he has the # for [facility]  Was to pt encouraged to call back with questions or concerns?  Yes

## 2020-09-05 ENCOUNTER — Encounter: Payer: Self-pay | Admitting: Internal Medicine

## 2020-09-05 ENCOUNTER — Ambulatory Visit: Payer: Self-pay | Admitting: Internal Medicine

## 2020-09-06 ENCOUNTER — Encounter (HOSPITAL_COMMUNITY): Payer: Self-pay | Admitting: Internal Medicine

## 2020-09-06 ENCOUNTER — Telehealth: Payer: Self-pay | Admitting: Licensed Clinical Social Worker

## 2020-09-06 NOTE — Telephone Encounter (Signed)
Call placed to patient to inquire about any behavioral health and/or resource needs. Pt's voicemail was not accepting messages at this time; therefore, LCSW was unable to leave a return message.

## 2020-09-06 NOTE — Anesthesia Postprocedure Evaluation (Signed)
Anesthesia Post Note  Patient: Greg Morales  Procedure(s) Performed: ESOPHAGOGASTRODUODENOSCOPY (EGD) WITH PROPOFOL (N/A ) BIOPSY     Patient location during evaluation: Endoscopy Anesthesia Type: MAC Level of consciousness: awake and alert Pain management: pain level controlled Vital Signs Assessment: post-procedure vital signs reviewed and stable Respiratory status: spontaneous breathing, nonlabored ventilation, respiratory function stable and patient connected to nasal cannula oxygen Cardiovascular status: stable and blood pressure returned to baseline Postop Assessment: no apparent nausea or vomiting Anesthetic complications: no   No complications documented.  Last Vitals:  Vitals:   09/03/20 1130 09/03/20 1155  BP: 125/90 98/63  Pulse: 75 73  Resp: 20 18  Temp:  36.9 C  SpO2: 99% 100%    Last Pain:  Vitals:   09/03/20 1155  TempSrc: Oral  PainSc: 0-No pain                 Laterrance Nauta

## 2020-09-09 ENCOUNTER — Telehealth (HOSPITAL_COMMUNITY): Payer: Self-pay | Admitting: Internal Medicine

## 2020-09-09 NOTE — Telephone Encounter (Signed)
Care Management - Follow Up [facility] Discharges   Writer attempted to make contact with patient today and was unsuccessful.   Patien's voicemail was not accepting messages at this time; therefore, writer was unable to leave a return message

## 2020-09-13 ENCOUNTER — Telehealth: Payer: Self-pay | Admitting: Licensed Clinical Social Worker

## 2020-09-13 NOTE — Telephone Encounter (Signed)
Call placed to patient. LCSW introduced self and explained role at [facility]. Pt reports interest in re-initiating behavioral health medications that he received through [facility] previously. LCSW informed patient of the [facility] and the services provided. Pt provided verbal consent for LCSW to provide additional information via mail to verified address.   LCSW strongly encouraged pt to contact her and/or clinic with any additional behavioral health concerns or resource needs. Pt was reminded of upcoming appointment with PCP. No additional concerns noted.

## 2020-09-15 LAB — DRUG PROFILE, UR, 9 DRUGS (LABCORP)
Amphetamines, Urine: NEGATIVE ng/mL
Barbiturate, Ur: NEGATIVE ng/mL
Benzodiazepine Quant, Ur: NEGATIVE ng/mL
Cannabinoid Quant, Ur: POSITIVE ng/mL — AB
Cocaine (Metab.): NEGATIVE ng/mL
Methadone Screen, Urine: NEGATIVE ng/mL
Opiate Quant, Ur: POSITIVE ng/mL — AB
Phencyclidine, Ur: NEGATIVE ng/mL
Propoxyphene, Urine: NEGATIVE ng/mL

## 2020-09-16 ENCOUNTER — Ambulatory Visit (INDEPENDENT_AMBULATORY_CARE_PROVIDER_SITE_OTHER): Payer: No Payment, Other | Admitting: Psychiatry

## 2020-09-16 ENCOUNTER — Other Ambulatory Visit (HOSPITAL_COMMUNITY): Payer: Self-pay | Admitting: Psychiatry

## 2020-09-16 ENCOUNTER — Other Ambulatory Visit: Payer: Self-pay

## 2020-09-16 ENCOUNTER — Encounter (HOSPITAL_COMMUNITY): Payer: Self-pay | Admitting: Psychiatry

## 2020-09-16 VITALS — BP 112/80 | HR 80 | Ht 65.0 in | Wt 155.0 lb

## 2020-09-16 DIAGNOSIS — F1994 Other psychoactive substance use, unspecified with psychoactive substance-induced mood disorder: Secondary | ICD-10-CM | POA: Diagnosis not present

## 2020-09-16 DIAGNOSIS — F411 Generalized anxiety disorder: Secondary | ICD-10-CM | POA: Insufficient documentation

## 2020-09-16 DIAGNOSIS — F333 Major depressive disorder, recurrent, severe with psychotic symptoms: Secondary | ICD-10-CM | POA: Diagnosis not present

## 2020-09-16 MED ORDER — MIRTAZAPINE 15 MG PO TABS: 15.0000 mg | ORAL_TABLET | Freq: Every day | ORAL | 2 refills | Status: DC

## 2020-09-16 MED ORDER — ARIPIPRAZOLE 5 MG PO TABS: 5.0000 mg | ORAL_TABLET | Freq: Every day | ORAL | 2 refills | Status: DC

## 2020-09-16 MED ORDER — HYDROXYZINE HCL 10 MG PO TABS: 10.0000 mg | ORAL_TABLET | Freq: Three times a day (TID) | ORAL | 2 refills | Status: DC | PRN

## 2020-09-16 MED FILL — hydrOXYzine HCL 10 MG TABS: 10 | 10 days supply | Qty: 30 | Fill #0

## 2020-09-16 MED FILL — ARIPiprazole 5 MG TABS: 5 | 30 days supply | Qty: 30 | Fill #0

## 2020-09-16 MED FILL — MIRTAZAPINE 15 MG TABLET: 15 | 30 days supply | Qty: 30 | Fill #0

## 2020-09-16 NOTE — Progress Notes (Signed)
Psychiatric Initial Adult Assessment   Patient Identification: SAAMIR ARMSTRONG MRN:  [id number] Date of Evaluation:  09/16/2020 Referral Source: Walk in   Chief Complaint:  "I need to restart medications but I don't want to gain weight"  Chief Complaint    Medication Management     Visit Diagnosis:    ICD-10-CM   1. Substance induced mood disorder (HCC)  F19.94 ARIPiprazole (ABILIFY) 5 MG tablet    Ambulatory referral to Social Work  2. Generalized anxiety disorder  F41.1 hydrOXYzine (ATARAX/VISTARIL) 10 MG tablet    mirtazapine (REMERON) 15 MG tablet    Ambulatory referral to Social Work  3. Severe episode of recurrent major depressive disorder, with psychotic features (HCC)  F33.3 ARIPiprazole (ABILIFY) 5 MG tablet    mirtazapine (REMERON) 15 MG tablet    Ambulatory referral to Social Work    History of Present Illness:  34 year old male seen today for initial psychiatric evaluation. He walked in to outpatient psychiatry for medication management.  He has a psychiatric history of substance-induced mood disorder, bipolar disorder, tobacco dependence, and marijuana use.  He notes that currently he is not on any medications however reports that he tried Zyprexa (was effective in managing his mood but he disliked it because of increased weight gain), Lamictal, Risperdal, and hydroxyzine.  Today he endorses increased depression, anxiety, and VAH.  Today patient is pleasant, calm, cooperative, engaged in conversation, and maintained eye contact.  He described his mood as anxious and depressed.  Provider conducted a GAD-7 and patient scored a 21.  Provider also conducted a PHQ-9 and patient scored a 26.  Patient notes that medications have been effective in the past however notes that he does not want to gain weight.  He notes that he has lost 140 pounds and likes his current weight.  Patient however endorses insomnia noting that he sleeps 2 to 3 hours a night, poor concentration, reduced  energy, and psychomotor agitation.  He endorses symptoms of mania such as distractibility, fluctuations in mood, racing thoughts, VAH (noting that he hears voices and thumps/loud noises.  He also notes that he sees shadows.)  Today he denies SI/HI or paranoia.  Patient is agreeable to starting Abilify 5 mg to help manage mood.  He is also agreeable to starting mirtazapine 15 mg to help manage sleep and symptoms of anxiety/depression.  He will also start hydroxyzine 10 mg 3 times daily as needed to help manage anxiety.  Medication sent to [facility] wellness as patient notes that he is currently unemployed and unable to afford his medications.Potential side effects of medication and risks vs benefits of treatment vs non-treatment were explained and discussed. All questions were answered.  He will follow up with outpatient counseling for therapy.  No other concerns noted at this time.   Associated Signs/Symptoms: Depression Symptoms:  depressed mood, insomnia, psychomotor agitation, fatigue, difficulty concentrating, impaired memory, anxiety, panic attacks, loss of energy/fatigue, disturbed sleep, weight loss, decreased appetite, (Hypo) Manic Symptoms:  Distractibility, Elevated Mood, Flight of Ideas, Licensed conveyancer, Hallucinations, Impulsivity, Irritable Mood, Sexually Inapproprite Behavior, Anxiety Symptoms:  Excessive Worry, Panic Symptoms, Psychotic Symptoms:  Hallucinations: Auditory Visual PTSD Symptoms: NA  Past Psychiatric History: substance-induced mood disorder, bipolar disorder, tobacco dependence, and marijuana use.  Previous Psychotropic Medications: Risperdal, zyprexa, vistral, and lamictal  Substance Abuse History in the last 12 months:  Yes.    Consequences of Substance Abuse: NA  Past Medical History:  Past Medical History:  Diagnosis Date  .  Chronic abdominal pain   . Medically noncompliant   . Polysubstance abuse (HCC)   . Seizures  (HCC)    since childhood    Past Surgical History:  Procedure Laterality Date  . BIOPSY  09/03/2020   Procedure: BIOPSY;  Surgeon: Beverley Fiedler, MD;  Location: [facility];  Service: Gastroenterology;;  . ESOPHAGOGASTRODUODENOSCOPY    . ESOPHAGOGASTRODUODENOSCOPY (EGD) WITH PROPOFOL N/A 09/03/2020   Procedure: ESOPHAGOGASTRODUODENOSCOPY (EGD) WITH PROPOFOL;  Surgeon: Beverley Fiedler, MD;  Location: [facility];  Service: Gastroenterology;  Laterality: N/A;    Family Psychiatric History: Father bipolar and schizophrenia and paternal grandmother bipolar and schizopohrenia Family History:  Family History  Problem Relation Age of Onset  . Hypertension Other   . Seizures Other   . Seizures Maternal Grandmother     Social History:   Social History   Socioeconomic History  . Marital status: Married    Spouse name: Dois Davenport  . Number of children: 3  . Years of education: Not on file  . Highest education level: Not on file  Occupational History  . Not on file  Tobacco Use  . Smoking status: Current Every Day Smoker    Packs/day: 1.00    Years: 5.00    Pack years: 5.00    Types: Cigarettes  . Smokeless tobacco: Never Used  Vaping Use  . Vaping Use: Never used  Substance and Sexual Activity  . Alcohol use: Not Currently  . Drug use: Not Currently    Types: Marijuana  . Sexual activity: Not Currently  Other Topics Concern  . Not on file  Social History Narrative   History of seizures since childhood per patient   Social Determinants of Health   Financial Resource Strain: Not on file  Food Insecurity: Not on file  Transportation Needs: Not on file  Physical Activity: Not on file  Stress: Not on file  Social Connections: Not on file    Additional Social History: Patient lives in [place]. He is married and has three children. He denies alcohol use. He endorses smoking marjuina daily and a pack of cigarettes daily. He is currently unemployed.   Allergies:  No Known  Allergies  Metabolic Disorder Labs: No results found for: HGBA1C, MPG No results found for: PROLACTIN No results found for: CHOL, TRIG, HDL, CHOLHDL, VLDL, LDLCALC Lab Results  Component Value Date   TSH 0.636 06/07/2010    Therapeutic Level Labs: No results found for: LITHIUM Lab Results  Component Value Date   CBMZ <2.0 (L) 05/28/2010   Lab Results  Component Value Date   VALPROATE 59 04/19/2015    Current Medications: Current Outpatient Medications  Medication Sig Dispense Refill  . ARIPiprazole (ABILIFY) 5 MG tablet Take 1 tablet (5 mg total) by mouth daily. 30 tablet 2  . hydrOXYzine (ATARAX/VISTARIL) 10 MG tablet Take 1 tablet (10 mg total) by mouth 3 (three) times daily as needed. 30 tablet 2  . lamoTRIgine (LAMICTAL) 100 MG tablet Take 1 tablet (100 mg total) by mouth daily. Take 3 tablets by mouth in the evening with meals (Patient not taking: Reported on 09/02/2020) 90 tablet 6  . levETIRAcetam (KEPPRA) 500 MG tablet Take 2 tabs PO Q a.m and 1 tab Po Q p.m (Patient taking differently: Take 500 mg by mouth in the morning, at noon, and at bedtime. ) 90 tablet 6  . mirtazapine (REMERON) 15 MG tablet Take 1 tablet (15 mg total) by mouth at bedtime. 30 tablet 2  . ondansetron (ZOFRAN  ODT) 4 MG disintegrating tablet 4mg  ODT q4 hours prn nausea/vomit 30 tablet 0  . pantoprazole (PROTONIX) 40 MG tablet Take 1 tablet (40 mg total) by mouth daily at 6 (six) AM. 30 tablet 1  . promethazine (PHENERGAN) 25 MG suppository Place 1 suppository (25 mg total) rectally every 6 (six) hours as needed for nausea or vomiting. (Patient not taking: Reported on 09/02/2020) 12 each 0   No current facility-administered medications for this visit.    Musculoskeletal: Strength & Muscle Tone: within normal limits Gait & Station: normal Patient leans: Right  Psychiatric Specialty Exam: Review of Systems  Blood pressure 112/80, pulse 80, height 5\' 5"  (1.651 m), weight 155 lb (70.3 kg), SpO2 100  %.Body mass index is 25.79 kg/m.  General Appearance: Well Groomed  Eye Contact:  Fair  Speech:  Clear and Coherent and Normal Rate  Volume:  Normal  Mood:  Anxious and Depressed  Affect:  Appropriate  Thought Process:  Coherent, Goal Directed and Linear  Orientation:  Full (Time, Place, and Person)  Thought Content:  WDL and Logical  Suicidal Thoughts:  No  Homicidal Thoughts:  No  Memory:  Immediate;   Good Recent;   Good Remote;   Good  Judgement:  Fair  Insight:  Good  Psychomotor Activity:  Normal  Concentration:  Concentration: Good and Attention Span: Good  Recall:  Good  Fund of Knowledge:Good  Language: Good  Akathisia:  No  Handed:  Right  AIMS (if indicated):  Not done  Assets:  Communication Skills Desire for Improvement Financial Resources/Insurance Housing Social Support  ADL's:  Intact  Cognition: WNL  Sleep:  Poor   Screenings: GAD-7   Flowsheet Row [facility]  Total GAD-7 Score 21 9 5 5 12     PHQ2-9   Flowsheet Row Clinical Support [facility]  PHQ-2 Total Score 6 6 3 3 2   PHQ-9 Total Score 26 24 9 9 2       Assessment and Plan: Patient endorses symptoms of anxiety, VAH and depression. He is agreeable to start Abilify 5 mg to help manage mood and VAH. He is also agreeable to start  mirtazapine 15 mg to help manage anxiety, depression, and sleep. He will start hydroxyzine 1- mg three times daily as needed for anxiety.   1. Substance induced mood disorder (HCC)  Start- ARIPiprazole (ABILIFY) 5 MG tablet; Take 1 tablet (5 mg total) by mouth daily.  Dispense: 30 tablet; Refill: 2  2. Generalized anxiety disorder  Start- hydrOXYzine (ATARAX/VISTARIL) 10 MG tablet; Take 1 tablet (10 mg total) by mouth 3 (three) times daily as needed.  Dispense: 30 tablet; Refill: 2 Start- mirtazapine (REMERON) 15 MG tablet; Take 1 tablet (15 mg total) by mouth at bedtime.  Dispense: 30 tablet; Refill: 2  3. Severe episode of recurrent major depressive disorder, with psychotic features (HCC)  Start- ARIPiprazole (ABILIFY) 5 MG tablet; Take 1 tablet (5  mg total) by mouth daily.  Dispense: 30 tablet; Refill: 2 Start- mirtazapine (REMERON) 15 MG tablet; Take 1 tablet (15 mg total) by mouth at bedtime.  Dispense: 30 tablet; Refill: 2  Follow up in 3 months Follow up with therapy Shanna Cisco, NP 12/20/20219:04 AM

## 2020-10-24 ENCOUNTER — Encounter (HOSPITAL_COMMUNITY): Payer: Self-pay | Admitting: Psychiatry

## 2020-10-28 ENCOUNTER — Other Ambulatory Visit: Payer: Self-pay | Admitting: Internal Medicine

## 2020-10-28 ENCOUNTER — Ambulatory Visit: Payer: Self-pay | Attending: Internal Medicine | Admitting: Internal Medicine

## 2020-10-28 ENCOUNTER — Other Ambulatory Visit: Payer: Self-pay

## 2020-10-28 ENCOUNTER — Encounter: Payer: Self-pay | Admitting: Internal Medicine

## 2020-10-28 VITALS — BP 122/86 | HR 78 | Temp 98.4°F | Resp 16 | Wt 139.6 lb

## 2020-10-28 DIAGNOSIS — G40409 Other generalized epilepsy and epileptic syndromes, not intractable, without status epilepticus: Secondary | ICD-10-CM

## 2020-10-28 DIAGNOSIS — Z09 Encounter for follow-up examination after completed treatment for conditions other than malignant neoplasm: Secondary | ICD-10-CM

## 2020-10-28 DIAGNOSIS — R109 Unspecified abdominal pain: Secondary | ICD-10-CM | POA: Insufficient documentation

## 2020-10-28 DIAGNOSIS — F1994 Other psychoactive substance use, unspecified with psychoactive substance-induced mood disorder: Secondary | ICD-10-CM

## 2020-10-28 DIAGNOSIS — R911 Solitary pulmonary nodule: Secondary | ICD-10-CM

## 2020-10-28 DIAGNOSIS — R634 Abnormal weight loss: Secondary | ICD-10-CM

## 2020-10-28 DIAGNOSIS — K529 Noninfective gastroenteritis and colitis, unspecified: Secondary | ICD-10-CM

## 2020-10-28 DIAGNOSIS — F333 Major depressive disorder, recurrent, severe with psychotic symptoms: Secondary | ICD-10-CM

## 2020-10-28 DIAGNOSIS — IMO0001 Reserved for inherently not codable concepts without codable children: Secondary | ICD-10-CM

## 2020-10-28 DIAGNOSIS — F17200 Nicotine dependence, unspecified, uncomplicated: Secondary | ICD-10-CM

## 2020-10-28 DIAGNOSIS — Z2821 Immunization not carried out because of patient refusal: Secondary | ICD-10-CM | POA: Insufficient documentation

## 2020-10-28 DIAGNOSIS — R569 Unspecified convulsions: Secondary | ICD-10-CM

## 2020-10-28 MED ORDER — PANTOPRAZOLE SODIUM 40 MG PO TBEC: 40.0000 mg | DELAYED_RELEASE_TABLET | Freq: Every day | ORAL | 1 refills | Status: DC

## 2020-10-28 MED ORDER — ONDANSETRON 4 MG PO TBDP
ORAL_TABLET | ORAL | 0 refills | Status: DC
Start: 2020-10-28 — End: 2020-11-22

## 2020-10-28 MED ORDER — LEVETIRACETAM 500 MG PO TABS: ORAL_TABLET | ORAL | 6 refills | Status: DC

## 2020-10-28 MED FILL — levETIRAcetam 500 MG TABS: 500 | 30 days supply | Qty: 90 | Fill #0

## 2020-10-28 MED FILL — ONDANSETRON ODT 4 MG TABLET: 4 | 5 days supply | Qty: 30 | Fill #0

## 2020-10-28 MED FILL — PANTOPRAZOLE SOD DR 40 MG T: 40 | 30 days supply | Qty: 30 | Fill #0

## 2020-10-28 NOTE — Progress Notes (Signed)
Patient ID: QUINTEZ MASELLI, male    DOB: [birth year]  MRN: [id number]  CC: Hospitalization Follow-up   Subjective: Yohan Samons is a 35 y.o. male who presents for hosp follow His concerns today include:  medical history significant for seizure disorder on Keppra, tobacco dependence, chronic abdominal pain, bipolar disorder,  lung nodule, substance-induced mood disorder, GAD, MDD  Patient hospitalized 12/6-03/2020 with recurrent epigastric abdominal pain that had been going on since May of last year.  He has had several admissions for the same and numerous ER visits.  He was admitted earlier that month from [facility] with abdominal pain, intractable vomiting and 80 pound weight loss.  Plan was to do an EGD but it was canceled as patient reported being suicidal.  Patient left AGAINST MEDICAL ADVICE. On this admission, he underwent EGD which came back normal.  Biopsy revealed some hyperemia of the stomach with negative H. pylori and no intestinal metaplasia, dysplasia or carcinoma.  After he was able to tolerate diet, he was discharged home on a PPI and Zofran.  He was to follow-up as outpatient.  Lipase was mildly elevated at 117.  Urine drug screen was positive for amphetamine, benzodiazepine, cocaine, THC.  Since hospitalization, patient states that he was doing better until about 2 days ago when he started having some pain in the left upper quadrant of the abdomen again.  Reports vomiting last night and some diarrhea last night.  He has had one bowel movement this morning which was soft.  No blood in the stools.  He has not taken anything for his symptoms.  He does not have Zofran or pantoprazole.  Reports issues with getting medications due to lack of finances.  Seizure disorder: Reports he has been off Keppra for 2 months due to lack of finances.  Depression/substance-induced mood disorder: Patient reports that he was suicidal but he has snapped out of it because he wants to be here for  his kids and his wife.  He has seen behavioral health last month and started on Abilify and Remeron.  He states that he is taking them and feels that they help.  He has a follow-up next month.  He continues to smoke and states he is not ready to quit.  We will following with chest CT for lung nodule.  Last CT was done in February of last year.  Nodule was stable in size but radiologist recommended 1 year follow-up study.  HM: He declines flu shot.  He declines COVID vaccine.  Patient Active Problem List   Diagnosis Date Noted  . Influenza vaccine refused 10/28/2020  . Generalized anxiety disorder 09/16/2020  . Severe episode of recurrent major depressive disorder, with psychotic features (HCC) 09/16/2020  . Abdominal pain, epigastric   . Substance induced mood disorder (HCC)   . Nausea and vomiting 08/27/2020  . Loss of weight 08/27/2020  . Intractable nausea and vomiting 08/27/2020  . Marijuana abuse 08/27/2020  . Seizures (HCC) 08/27/2020  . Lung nodule < 6cm on CT 07/27/2019  . 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 Tobacco dependence 05/24/2018  . History of bipolar disorder 05/24/2018  . Elevated blood lead level 05/24/2018     Current Outpatient Medications on File Prior to Visit  Medication Sig Dispense Refill  . ARIPiprazole (ABILIFY) 5 MG tablet Take 1 tablet (5 mg total) by mouth daily. 30 tablet 2  . hydrOXYzine (ATARAX/VISTARIL) 10 MG tablet Take 1 tablet (10 mg total) by mouth 3 (three)  times daily as needed. 30 tablet 2  . lamoTRIgine (LAMICTAL) 100 MG tablet Take 1 tablet (100 mg total) by mouth daily. Take 3 tablets by mouth in the evening with meals (Patient not taking: Reported on 09/02/2020) 90 tablet 6  . mirtazapine (REMERON) 15 MG tablet Take 1 tablet (15 mg total) by mouth at bedtime. 30 tablet 2   No current facility-administered medications on file prior to visit.    No Known Allergies  Social History   Socioeconomic History  . Marital status: Married     Spouse name: Dois Davenport  . Number of children: 3  . Years of education: Not on file  . Highest education level: Not on file  Occupational History  . Not on file  Tobacco Use  . Smoking status: Current Every Day Smoker    Packs/day: 1.00    Years: 5.00    Pack years: 5.00    Types: Cigarettes  . Smokeless tobacco: Never Used  Vaping Use  . Vaping Use: Never used  Substance and Sexual Activity  . Alcohol use: Not Currently  . Drug use: Not Currently    Types: Marijuana  . Sexual activity: Not Currently  Other Topics Concern  . Not on file  Social History Narrative   History of seizures since childhood per patient   Social Determinants of Health   Financial Resource Strain: Not on file  Food Insecurity: Not on file  Transportation Needs: Not on file  Physical Activity: Not on file  Stress: Not on file  Social Connections: Not on file  Intimate Partner Violence: Not on file    Family History  Problem Relation Age of Onset  . Hypertension Other   . Seizures Other   . Seizures Maternal Grandmother     Past Surgical History:  Procedure Laterality Date  . BIOPSY  09/03/2020   Procedure: BIOPSY;  Surgeon: Beverley Fiedler, MD;  Location: [facility];  Service: Gastroenterology;;  . ESOPHAGOGASTRODUODENOSCOPY    . ESOPHAGOGASTRODUODENOSCOPY (EGD) WITH PROPOFOL N/A 09/03/2020   Procedure: ESOPHAGOGASTRODUODENOSCOPY (EGD) WITH PROPOFOL;  Surgeon: Beverley Fiedler, MD;  Location: [facility];  Service: Gastroenterology;  Laterality: N/A;    ROS: Review of Systems Negative except as stated above  PHYSICAL EXAM: BP 122/86   Pulse 78   Temp 98.4 F (36.9 C)   Resp 16   Wt 139 lb 9.6 oz (63.3 kg)   SpO2 99%   BMI 23.23 kg/m   Wt Readings from Last 3 Encounters:  10/28/20 139 lb 9.6 oz (63.3 kg)  09/16/20 155 lb (70.3 kg)  09/02/20 135 lb (61.2 kg)    Physical Exam General appearance - alert, well appearing, and in no distress.  When I walked in the room, patient was  laying on the exam table curled up in a ball.  He did sit up eventually. Mental status -he was tearful a little bit when asked if he can give blood stating that "I do want to deal with that right now."  He later became agitated as we continue to talk Chest - clear to auscultation, no wheezes, rales or rhonchi, symmetric air entry Heart - normal rate, regular rhythm, normal S1, S2, no murmurs, rubs, clicks or gallops Abdomen - soft, nontender, nondistended, no masses or organomegaly  Depression screen [facility] 2/9 10/28/2020 09/16/2020 08/29/2020  Decreased Interest 2 3 3   Down, Depressed, Hopeless 1 3 3   PHQ - 2 Score 3 6 6   Altered sleeping 1 3 3   Tired, decreased  energy 1 3 3   Change in appetite 1 3 3   Feeling bad or failure about yourself  0 3 2  Trouble concentrating 1 3 2   Moving slowly or fidgety/restless 0 3 2  Suicidal thoughts 0 2 3  PHQ-9 Score 7 26 24     CMP Latest Ref Rng & Units 09/03/2020 09/02/2020 08/29/2020  Glucose 70 - 99 mg/dL 91 92 71  BUN 6 - 20 mg/dL 6 <4(R) 9  Creatinine 1.54 - 1.24 mg/dL 0.08 6.76 1.95  Sodium 135 - 145 mmol/L 140 140 142  Potassium 3.5 - 5.1 mmol/L 3.3(L) 3.5 3.6  Chloride 98 - 111 mmol/L 105 104 108  CO2 22 - 32 mmol/L 25 28 22   Calcium 8.9 - 10.3 mg/dL 8.9 9.3 9.2  Total Protein 6.5 - 8.1 g/dL 6.1(L) 7.3 6.7  Total Bilirubin 0.3 - 1.2 mg/dL 0.8 0.6 0.8  Alkaline Phos 38 - 126 U/L 48 51 45  AST 15 - 41 U/L 14(L) 23 28  ALT 0 - 44 U/L 19 25 24    Lipid Panel  No results found for: CHOL, TRIG, HDL, CHOLHDL, VLDL, LDLCALC, LDLDIRECT  CBC    Component Value Date/Time   WBC 5.7 09/03/2020 0533   RBC 4.55 09/03/2020 0533   HGB 14.4 09/03/2020 0533   HGB 16.0 05/24/2018 1538   HCT 43.7 09/03/2020 0533   HCT 46.0 05/24/2018 1538   PLT 257 09/03/2020 0533   PLT 378 05/24/2018 1538   MCV 96.0 09/03/2020 0533   MCV 92 05/24/2018 1538   MCH 31.6 09/03/2020 0533   MCHC 33.0 09/03/2020 0533   RDW 13.3 09/03/2020 0533   RDW 14.6 05/24/2018 1538    LYMPHSABS 2.9 08/29/2020 2347   MONOABS 0.6 08/29/2020 2347   EOSABS 0.1 08/29/2020 2347   BASOSABS 0.1 08/29/2020 2347    ASSESSMENT AND PLAN: 1. Hospital discharge follow-up  2. Recurrent abdominal pain Patient with recurrent abdominal pain, vomiting unintended weight loss.  He has seen GI in [place] and [place].  I will get him back in with the [place].  HIV test has been negative.  Encouraged him to stop street drug use.  Informed her that chronic marijuana use can cause hyperemesis Declines blood test today to check lipase level.  Denies EtOH use. - Ambulatory referral to Gastroenterology  3. Unintended weight loss See #2 above. - TSH+T4F+T3Free  4. Gastroenteritis Recommend using Pepto-Bismol over-the-counter.    5. Tobacco dependence Strongly advised to quit.  Discussed health risks associated with smoking.  Patient not willing to give a trial of quitting.  Less than 5 minutes spent on counseling.  6. Seizures (HCC) Refill given on Keppra.  7. Influenza vaccine refused Advised.  Patient declined.  8. Substance induced mood disorder (HCC) 9. Severe episode of recurrent major depressive disorder, with psychotic features (HCC) Plugged into mental health.  Encouraged him to continue his medications  10. COVID-19 vaccination declined   11. Lung nodule < 6cm on CT Patient agreeable for CAT scan to follow-up on this. - CT Chest Wo Contrast; Future  12. 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) - levETIRAcetam (KEPPRA) 500 MG tablet; Take 2 tabs PO Q a.m and 1 tab Po Q p.m  Dispense: 90 tablet; Refill: 6  Message sent to social worker to see whether there is any program that can assist him with getting his medications.   Patient was given the opportunity to ask questions.  Patient verbalized understanding of the plan and was able to repeat key elements  of the plan.   Orders Placed This Encounter  Procedures  . CT Chest Wo Contrast  . TSH+T4F+T3Free  . Ambulatory referral to  Gastroenterology     Requested Prescriptions   Signed Prescriptions Disp Refills  . levETIRAcetam (KEPPRA) 500 MG tablet 90 tablet 6    Sig: Take 2 tabs PO Q a.m and 1 tab Po Q p.m  . pantoprazole (PROTONIX) 40 MG tablet 30 tablet 1    Sig: Take 1 tablet (40 mg total) by mouth daily at 6 (six) AM.  . ondansetron (ZOFRAN ODT) 4 MG disintegrating tablet 30 tablet 0    Sig: 4mg  ODT q4 hours prn nausea/vomit    No follow-ups on file.  , MD, FACP

## 2020-10-31 ENCOUNTER — Telehealth: Payer: Self-pay | Admitting: Licensed Clinical Social Worker

## 2020-10-31 NOTE — Telephone Encounter (Signed)
Call placed to patient regarding resources to assist with medications. Mailbox was full; therefore, LCSW was unable to leave a return message.

## 2020-11-05 ENCOUNTER — Ambulatory Visit (HOSPITAL_COMMUNITY): Payer: Self-pay

## 2020-11-18 ENCOUNTER — Other Ambulatory Visit: Payer: Self-pay

## 2020-11-18 ENCOUNTER — Ambulatory Visit (INDEPENDENT_AMBULATORY_CARE_PROVIDER_SITE_OTHER): Payer: No Payment, Other | Admitting: Clinical

## 2020-11-18 DIAGNOSIS — F333 Major depressive disorder, recurrent, severe with psychotic symptoms: Secondary | ICD-10-CM | POA: Diagnosis not present

## 2020-11-19 NOTE — Progress Notes (Signed)
   THERAPIST PROGRESS NOTE  Session Time: 40 minutes  Participation Level: Active  Behavioral Response: CasualAlertEuthymic  Type of Therapy: Individual Therapy  Treatment Goals addressed: Anger and Coping  Interventions: CBT  Summary:  Greg Morales is a 35 y.o. male who presents for the scheduled session oriented times five, appropriately dressed, and friendly. Client denied hallucinations and delusions. Client presents for initial appointment with the therapist to begin outpatient therapy services. Client reported this is his first time participating in outpatient therapy services. Client was seen on 08/29/2021 at [facility] urgent care for substance induced behaviors, aggressive behavior, and depression. Client reported he has a history of mood swings and aggressive behaviors involving property damage and verbal arguments. Client reported he has a trauma history from "being in the streets" and having a hard time trusting people. Client reported he has a distant and strained relationship with his immediate family because of his history. Client reported he has been keeping himself distant from others until he gets his mental health in order because he is easily agitated. Client reported he has been home from prison for about five years now and has had a hard time securing employment because of his felonies. Client reported he lives with his wife and his kids. Client reported he has epilepsy and uses a service dog. Client reported he is looking into the process of beginning disability. Client reported he uses marijuana to help keep his mood calm. Client reported he wants to use therapy to help him learn ways to manage his emotions.    Suicidal/Homicidal: Nowithout intent/plan  Therapist Response:  Therapist began the session making introductions and discussing confidentiality.  Therapist engaged with the client to discuss his current his mental health history. Therapist asked the client open  ended questions about current symptoms and stressors that impact his symptoms. Therapist collaborated with the client to discuss what he would like to work on in therapy.  Therapist completed the impulse control treatment plan based on the clients needs. Therapist addressed questions and concerns. Client was scheduled for next appointment.     Plan: Return again in 5 weeks for individual therapy.  Diagnosis: Severe episode of recurrent major depressive disorder, with psychotic features  Neena Rhymes Honi Name, LCSW 11/18/2020

## 2020-11-22 ENCOUNTER — Other Ambulatory Visit: Payer: Self-pay

## 2020-11-22 ENCOUNTER — Ambulatory Visit (INDEPENDENT_AMBULATORY_CARE_PROVIDER_SITE_OTHER): Payer: Self-pay | Admitting: Gastroenterology

## 2020-11-22 ENCOUNTER — Ambulatory Visit (INDEPENDENT_AMBULATORY_CARE_PROVIDER_SITE_OTHER)
Admission: RE | Admit: 2020-11-22 | Discharge: 2020-11-22 | Disposition: A | Discharge status: Home / Self Care | Payer: Self-pay | Source: Ambulatory Visit | Attending: Gastroenterology | Admitting: Gastroenterology

## 2020-11-22 ENCOUNTER — Encounter: Payer: Self-pay | Admitting: Gastroenterology

## 2020-11-22 VITALS — BP 112/70 | HR 100 | Ht 65.0 in | Wt 132.2 lb

## 2020-11-22 DIAGNOSIS — R634 Abnormal weight loss: Secondary | ICD-10-CM

## 2020-11-22 DIAGNOSIS — R110 Nausea: Secondary | ICD-10-CM

## 2020-11-22 DIAGNOSIS — R109 Unspecified abdominal pain: Secondary | ICD-10-CM

## 2020-11-22 DIAGNOSIS — R197 Diarrhea, unspecified: Secondary | ICD-10-CM | POA: Insufficient documentation

## 2020-11-22 MED ORDER — PANTOPRAZOLE SODIUM 40 MG PO TBEC: 40.0000 mg | DELAYED_RELEASE_TABLET | Freq: Every day | ORAL | 3 refills | Status: DC

## 2020-11-22 MED ORDER — IOHEXOL 300 MG/ML  SOLN
100.0000 mL | Freq: Once | INTRAMUSCULAR | Status: AC | PRN
  Administered 2020-11-22: 100 mL via INTRAVENOUS

## 2020-11-22 NOTE — Progress Notes (Signed)
11/22/2020 Greg Morales [id number] 10/03/1985   HISTORY OF PRESENT ILLNESS: This is a 35 year old male who is a patient of Dr. Lanetta Inch.  He has only been seen here in the office once, by me back on August 27, 2020.  At that time he was vomiting, dry heaving, and writhing around in pain while in our office.  He was sent to the emergency department.  He was admitted to the hospital and while there he underwent EGD.  EGD on September 03, 2020 by Dr. Rhea Belton during his hospital stay showed a hypertonic LES that was nonspecific.  Otherwise the study was normal.  Duodenal biopsies were normal.  Gastric biopsies showed some hyperemia, negative for H. Pylori.  He has been maintained on pantoprazole 40 mg daily.  He is here today with ongoing complaints of abdominal pain.  He says that he has pain in his mid abdomen every day, usually in the mornings.  He says that every morning he has to take a shower for 2 to 3 hours and just sometimes sit on the floor in the shower.  It usually subsides somewhat throughout the day.  He reports a lot of nausea, no significant vomiting but says that he brings a lot of saliva up in his mouth.  He continues to lose weight and is down about 5 more pounds from when he was seen here on November 30.  He reports diarrhea with everything that he eats.   Past Medical History:  Diagnosis Date  . Chronic abdominal pain   . Medically noncompliant   . Polysubstance abuse (HCC)   . Seizures (HCC)    since childhood   Past Surgical History:  Procedure Laterality Date  . BIOPSY  09/03/2020   Procedure: BIOPSY;  Surgeon: Beverley Fiedler, MD;  Location: [facility];  Service: Gastroenterology;;  . ESOPHAGOGASTRODUODENOSCOPY    . ESOPHAGOGASTRODUODENOSCOPY (EGD) WITH PROPOFOL N/A 09/03/2020   Procedure: ESOPHAGOGASTRODUODENOSCOPY (EGD) WITH PROPOFOL;  Surgeon: Beverley Fiedler, MD;  Location: [facility];  Service: Gastroenterology;  Laterality: N/A;    reports that he has  been smoking cigarettes. He has a 5.00 pack-year smoking history. He has never used smokeless tobacco. He reports previous alcohol use. He reports previous drug use. Drug: Marijuana. family history includes Hypertension in an other family member; Seizures in his maternal grandmother and another family member. No Known Allergies    Outpatient Encounter Medications as of 11/22/2020  Medication Sig  . ARIPiprazole (ABILIFY) 5 MG tablet Take 1 tablet (5 mg total) by mouth daily.  . hydrOXYzine (ATARAX/VISTARIL) 10 MG tablet Take 1 tablet (10 mg total) by mouth 3 (three) times daily as needed.  . lamoTRIgine (LAMICTAL) 100 MG tablet Take 1 tablet (100 mg total) by mouth daily. Take 3 tablets by mouth in the evening with meals  . levETIRAcetam (KEPPRA) 500 MG tablet Take 2 tabs PO Q a.m and 1 tab Po Q p.m  . mirtazapine (REMERON) 15 MG tablet Take 1 tablet (15 mg total) by mouth at bedtime.  . [DISCONTINUED] pantoprazole (PROTONIX) 40 MG tablet Take 1 tablet (40 mg total) by mouth daily at 6 (six) AM.  . pantoprazole (PROTONIX) 40 MG tablet Take 1 tablet (40 mg total) by mouth daily at 6 (six) AM.  . [DISCONTINUED] ondansetron (ZOFRAN ODT) 4 MG disintegrating tablet 4mg  ODT q4 hours prn nausea/vomit   No facility-administered encounter medications on file as of 11/22/2020.     REVIEW OF SYSTEMS  : All other  systems reviewed and negative except where noted in the History of Present Illness.   PHYSICAL EXAM: BP 112/70 (BP Location: Left Arm, Patient Position: Sitting, Cuff Size: Normal)   Pulse 100   Ht 5\' 5"  (1.651 m)   Wt 132 lb 4 oz (60 kg)   BMI 22.01 kg/m  General: Well developed AA male in no acute distress Head: Normocephalic and atraumatic Eyes:  Sclerae anicteric, conjunctiva pink. Ears: Normal auditory acuity Lungs: Clear throughout to auscultation; no W/R/R. Heart: Regular rate and rhythm; no M/R/G. Abdomen: Soft, non-distended.  BS present.  Expresses mid-abdominal  TTP. Musculoskeletal: Symmetrical with no gross deformities  Skin: No lesions on visible extremities Extremities: No edema  Neurological: Alert oriented x 4, grossly non-focal Psychological:  Alert and cooperative. Normal mood and affect  ASSESSMENT AND PLAN: *35 year old male with complaints of ongoing abdominal pain, weight loss, nausea/vomiting: It seems like maybe the vomiting has calmed down.  I suspect that some of his symptoms may be due to drug/marijuana use.  He has continued to lose weight and is down about 5 pounds compared to his office visit here in late November.  He is writhing around in pain in the office here again today and is asking what can be done about his symptoms today.  I will get a stat CT scan of the abdomen and pelvis with contrast to rule out anything acute although he reports the symptoms are ongoing on a daily basis.  Pending results of that may consider gastric emptying scan and/or colonoscopy for completion.  He needs cessation of marijuana/drug use.   CC:  December, MD

## 2020-11-22 NOTE — Patient Instructions (Addendum)
If you are age 36 or older, your body mass index should be between 23-30. Your Body mass index is 22.01 kg/m. If this is out of the aforementioned range listed, please consider follow up with your Primary Care Provider.  If you are age 37 or younger, your body mass index should be between 19-25. Your Body mass index is 22.01 kg/m. If this is out of the aformentioned range listed, please consider follow up with your Primary Care Provider.   We have printed the following medications for you to take to your pharmacy at your convenience: Pantoprazole.  You have been scheduled for a CT scan of the abdomen and pelvis at [facility] ([address]---this is in the same building as [employer]).   You are scheduled on today at 1:50 pm. You should arrive 15 minutes prior to your appointment time for registration. Please follow the written instructions below on the day of your exam:  WARNING: IF YOU ARE ALLERGIC TO IODINE/X-RAY DYE, PLEASE NOTIFY RADIOLOGY IMMEDIATELY AT [phone]! YOU WILL BE GIVEN A 13 HOUR PREMEDICATION PREP.  1) Do not eat or drink anything after now (4 hours prior to your test) 2) You have been given 2 bottles of oral contrast to drink. The solution may taste better if refrigerated, but do NOT add ice or any other liquid to this solution. Shake well before drinking.    Drink 1 bottle of contrast @ 11:50  am (2 hours prior to your exam)  Drink 1 bottle of contrast @ 12:50 pm (1 hour prior to your exam)  You may take any medications as prescribed with a small amount of water, if necessary. If you take any of the following medications: METFORMIN, GLUCOPHAGE, GLUCOVANCE, AVANDAMET, RIOMET, FORTAMET, Tolstoy MET, JANUMET, GLUMETZA or METAGLIP, you MAY be asked to HOLD this medication 48 hours AFTER the exam.  The purpose of you drinking the oral contrast is to aid in the visualization of your intestinal tract. The contrast solution may cause some diarrhea.  Depending on your individual set of symptoms, you may also receive an intravenous injection of x-ray contrast/dye. Plan on being at [facility] for 30 minutes or longer, depending on the type of exam you are having performed.  This test typically takes 30-45 minutes to complete.  If you have any questions regarding your exam or if you need to reschedule, you may call the CT department at [phone] between the hours of 8:00 am and 5:00 pm, Monday-Friday.  ______________________________________________________________

## 2020-11-22 NOTE — Progress Notes (Signed)
Agree with assessment / plan with the following thoughts: - if he is using marijuana routinely he needs to completely avoid this given these symptoms, could be the underlying culprit - would place him on standing Zofran every 6 hours to see if that helps in addition to PPI - CT scan reviewed. He has had 3 CT scans in the past 1-2 years for this. I don't see anything obvious to cause his symptoms. He has some mild inflammatory changes in his colon vs. Underdistension. If he has ongoing diarrhea can schedule him for a colonoscopy if he is amenable to ensure no evidence of IBD / colitis - may otherwise consider empiric trial of Reglan vs. GES, but want him to completely avoid marijuana first to see if that helps.

## 2020-11-25 ENCOUNTER — Telehealth: Payer: Self-pay

## 2020-11-25 NOTE — Telephone Encounter (Signed)
The pt has been advised and aware to pick up handout from our office.

## 2020-11-25 NOTE — Telephone Encounter (Signed)
-----   Message from Leta Baptist, PA-C sent at 11/22/2020  5:18 PM EST ----- Please let him know that Dr. Adela Lank also reviewed his CT scan results and agrees with colonoscopy as next step, but he also would like to stress that he needs to discontinue the marijuana use and all other street drug use as well if he has not already.  Dr. Adela Lank would like him to be given a copy of the hyperemesis related to cannabis use handout that we have here in the office if you could either mail one to him or he could stop by to pick it up.  He said that capsaicin cream applied to the abdomen also has been shown to help abdominal pain related to the cannabis use.  That is over-the-counter.  Thank you,  Jess

## 2020-11-27 ENCOUNTER — Telehealth: Payer: Self-pay | Admitting: Internal Medicine

## 2020-11-27 NOTE — Telephone Encounter (Signed)
Calling to check the status of a disability claim request.  Please call to discuss at [phone]

## 2020-11-27 NOTE — Telephone Encounter (Signed)
Returned Greg Morales with Disability Determination [employer].  Call and per Casimiro Needle he is wanting to check the status of medical records being sent

## 2020-12-03 ENCOUNTER — Telehealth: Payer: Self-pay

## 2020-12-03 NOTE — Telephone Encounter (Signed)
Joni from [facility] called to discuss pt's medications.

## 2020-12-04 ENCOUNTER — Other Ambulatory Visit: Payer: Self-pay | Admitting: Pharmacist

## 2020-12-04 ENCOUNTER — Telehealth: Payer: Self-pay | Admitting: Licensed Clinical Social Worker

## 2020-12-04 DIAGNOSIS — K529 Noninfective gastroenteritis and colitis, unspecified: Secondary | ICD-10-CM

## 2020-12-04 MED ORDER — OMEPRAZOLE 40 MG PO CPDR
40.0000 mg | DELAYED_RELEASE_CAPSULE | Freq: Every day | ORAL | 2 refills | Status: AC
  Filled 2021-02-19: qty 30, 30d supply, fill #0

## 2020-12-04 MED FILL — ?OMEPRAZOLE 20 MG CPDR: 20 | 30 days supply | Qty: 60 | Fill #0

## 2020-12-04 MED FILL — ?LEVETIRACETAM 1000 MG TAB: 1000 MG | 30 days supply | Qty: 45 | Fill #0

## 2020-12-04 MED FILL — hydrOXYzine HCL 10 MG TABS: 10 | 10 days supply | Qty: 30 | Fill #1

## 2020-12-04 MED FILL — ?ARIPIPRAZOLE 5MG TABLET: 5 | 30 days supply | Qty: 30 | Fill #1

## 2020-12-04 MED FILL — ?MIRTAZAPINE 15 MG TABLET: 15 | 30 days supply | Qty: 30 | Fill #1

## 2020-12-04 NOTE — Telephone Encounter (Signed)
Call placed to patient regarding medication assistance. Patient shared that he is experiencing financial strain and is in need of refills for all of his medications. LCSW spoke with Tresa Endo at [facility], who assisted in re-filling 5 prescribed medications for $4. Pt was appreciative and agreed to pick up meds by Friday, March 11, 21

## 2020-12-04 NOTE — Progress Notes (Signed)
Changed Protonix to Prilosec d/t financial constraints. He can get omeprazole for free at our pharmacy.

## 2020-12-06 NOTE — Telephone Encounter (Signed)
Casimiro Needle, from disability determination, calling again and is requesting to have medical records for pt from PCP. He states that he has received other medical records, but not from PCP. He states that he is needing to have CPE notes, and primary care treatment stating that he has physical issues. Please advise.       [phone]

## 2020-12-09 NOTE — Telephone Encounter (Signed)
Called to speak with Greg Morales at [phone]. No answer left VM to call back.

## 2020-12-09 NOTE — Telephone Encounter (Signed)
Galin could you please follow up with this

## 2020-12-18 ENCOUNTER — Other Ambulatory Visit: Payer: Self-pay | Admitting: Psychiatry

## 2020-12-18 ENCOUNTER — Telehealth (INDEPENDENT_AMBULATORY_CARE_PROVIDER_SITE_OTHER): Payer: No Payment, Other | Admitting: Psychiatry

## 2020-12-18 ENCOUNTER — Other Ambulatory Visit: Payer: Self-pay

## 2020-12-18 ENCOUNTER — Encounter (HOSPITAL_COMMUNITY): Payer: Self-pay | Admitting: Psychiatry

## 2020-12-18 DIAGNOSIS — F333 Major depressive disorder, recurrent, severe with psychotic symptoms: Secondary | ICD-10-CM

## 2020-12-18 DIAGNOSIS — F411 Generalized anxiety disorder: Secondary | ICD-10-CM

## 2020-12-18 DIAGNOSIS — F1994 Other psychoactive substance use, unspecified with psychoactive substance-induced mood disorder: Secondary | ICD-10-CM

## 2020-12-18 MED ORDER — HYDROXYZINE HCL 25 MG PO TABS: 25.0000 mg | ORAL_TABLET | Freq: Three times a day (TID) | ORAL | 2 refills | Status: DC | PRN

## 2020-12-18 MED ORDER — ARIPIPRAZOLE (SENSOR) 10 MG PO TABS: 10.0000 mg | ORAL_TABLET | Freq: Every day | ORAL | 2 refills | Status: DC

## 2020-12-18 MED ORDER — MIRTAZAPINE 30 MG PO TABS: 30.0000 mg | ORAL_TABLET | Freq: Every day | ORAL | 2 refills | Status: DC

## 2020-12-18 MED FILL — ?ARIPIPRAZOLE 10MG TABLETS: 10 | 30 days supply | Qty: 30 | Fill #0

## 2020-12-18 MED FILL — MIRTAZAPINE 30 MG TABLET: 30 | 30 days supply | Qty: 30 | Fill #0

## 2020-12-18 NOTE — Progress Notes (Signed)
BH MD/PA/NP OP Progress Note Virtual Visit via Telephone Note  I connected with Greg Morales on 12/18/20 at  8:30 AM EDT by telephone and verified that I am speaking with the correct person using two identifiers.  Location: Patient: home Provider: Clinic   I discussed the limitations, risks, security and privacy concerns of performing an evaluation and management service by telephone and the availability of in person appointments. I also discussed with the patient that there may be a patient responsible charge related to this service. The patient expressed understanding and agreed to proceed.   I provided 30 minutes of non-face-to-face time during this encounter.      12/18/2020 9:01 AM Greg Morales  MRN:  [id number]  Chief Complaint: "Im still having mood swings"  HPI:  35 year old male seen today for follow up psychiatric evaluation.  He has a psychiatric history of substance-induced mood disorder, bipolar disorder, tobacco dependence, and marijuana use.  He is currently managed on Remeron 15 mg nightly and Abilify 5 mg daily.  He is prescribed Lamictal by his primary care doctor for a seizure disorder.  He notes his medications are somewhat effective in managing his psychiatric conditions.    Today patient was unable to log on virtually so his assessment was done over the phone.  During exam patient speech was slurred, delayed, decreased but clear and coherent.  He was somewhat engaged in conversation and notes that since his last visit he has still been having mood swings.  Provider asked patient if he continues to smoke marijuana and he notes that he smokes it daily.  Provider informed patient that marijuana can exacerbate his mental health conditions.  He endorsed irritability, distractibility, racing thoughts, and paranoia.  He notes that since starting Abilify his hallucinations has reduced however notes that he still has them occasionally.  Patient was seen at [facility] on  11/22/2020 presenting with abdominal pain, nausea, and diarrhea.  He was started on Zofran and pantoprazole.  Patient was also encouraged to reduce his marijuana consumption due to suspected cannabis induced hyperemesis.  Patient also notes that he has mouth pain.  He notes that his his filling fell out and notes that he needs to be seen by dentist.  He describes his pain as 10 out of 10.  Today patient notes that his anxiety and depression has somewhat improved however notes that it is still present.   Provider conducted a GAD-7 and patient scored a 15, at his last visit he scored a 21.  Provider also conducted a PHQ-9 and patient scored a 18, at his last visit he scored a 26.    Patient notes that his sleep continues to be poor.  He notes that he sleeps 2 to 3 hours nightly.  He also informed provider that his appetite is poor and notes that he has been losing weight.    Today he is agreeable to increasing Abilify 5 mg to 10 mg to help manage mood.  He is also agreeable to increase mirtazapine 15 mg to 30 mg to help manage anxiety, depression, appetite, and sleep.  Patient will increase hydroxyzine 10 mg to 25 mg 3 times daily to help manage anxiety.     Provider encouraged patient to reduce his cannabis consumption. He will follow up with outpatient counseling for therapy.  No other concerns noted at this time. Visit Diagnosis:    ICD-10-CM   1. Substance induced mood disorder (HCC)  F19.94 ARIPiprazole, sensor, 10 MG TABS  DISCONTINUED: ARIPiprazole 10 MG TABS  2. Severe episode of recurrent major depressive disorder, with psychotic features (HCC)  F33.3 mirtazapine (REMERON) 30 MG tablet    ARIPiprazole, sensor, 10 MG TABS    DISCONTINUED: ARIPiprazole 10 MG TABS    DISCONTINUED: mirtazapine (REMERON) 30 MG tablet  3. Generalized anxiety disorder  F41.1 mirtazapine (REMERON) 30 MG tablet    hydrOXYzine (ATARAX/VISTARIL) 25 MG tablet    DISCONTINUED: mirtazapine (REMERON) 30 MG tablet     DISCONTINUED: hydrOXYzine (ATARAX/VISTARIL) 25 MG tablet    Past Psychiatric History:  substance-induced mood disorder, bipolar disorder, tobacco dependence, and marijuana use  Past Medical History:  Past Medical History:  Diagnosis Date  . Chronic abdominal pain   . Medically noncompliant   . Polysubstance abuse (HCC)   . Seizures (HCC)    since childhood    Past Surgical History:  Procedure Laterality Date  . BIOPSY  09/03/2020   Procedure: BIOPSY;  Surgeon: Beverley Fiedler, MD;  Location: [facility];  Service: Gastroenterology;;  . ESOPHAGOGASTRODUODENOSCOPY    . ESOPHAGOGASTRODUODENOSCOPY (EGD) WITH PROPOFOL N/A 09/03/2020   Procedure: ESOPHAGOGASTRODUODENOSCOPY (EGD) WITH PROPOFOL;  Surgeon: Beverley Fiedler, MD;  Location: [facility];  Service: Gastroenterology;  Laterality: N/A;    Family Psychiatric History: Father bipolar and schizophrenia and paternal grandmother bipolar and schizopohrenia  Family History:  Family History  Problem Relation Age of Onset  . Hypertension Other   . Seizures Other   . Seizures Maternal Grandmother     Social History:  Social History   Socioeconomic History  . Marital status: Married    Spouse name: Dois Davenport  . Number of children: 3  . Years of education: Not on file  . Highest education level: Not on file  Occupational History  . Not on file  Tobacco Use  . Smoking status: Current Every Day Smoker    Packs/day: 1.00    Years: 5.00    Pack years: 5.00    Types: Cigarettes  . Smokeless tobacco: Never Used  Vaping Use  . Vaping Use: Never used  Substance and Sexual Activity  . Alcohol use: Not Currently  . Drug use: Not Currently    Types: Marijuana  . Sexual activity: Not Currently  Other Topics Concern  . Not on file  Social History Narrative   History of seizures since childhood per patient   Social Determinants of Health   Financial Resource Strain: Not on file  Food Insecurity: Not on file  Transportation Needs:  Not on file  Physical Activity: Not on file  Stress: Not on file  Social Connections: Not on file    Allergies: Not on File  Metabolic Disorder Labs: No results found for: HGBA1C, MPG No results found for: PROLACTIN No results found for: CHOL, TRIG, HDL, CHOLHDL, VLDL, LDLCALC Lab Results  Component Value Date   TSH 0.636 06/07/2010    Therapeutic Level Labs: No results found for: LITHIUM Lab Results  Component Value Date   VALPROATE 59 04/19/2015   No components found for:  CBMZ  Current Medications: Current Outpatient Medications  Medication Sig Dispense Refill  . ARIPiprazole, sensor, 10 MG TABS Take 10 mg by mouth daily. 30 tablet 2  . hydrOXYzine (ATARAX/VISTARIL) 25 MG tablet Take 1 tablet (25 mg total) by mouth 3 (three) times daily as needed. 90 tablet 2  . lamoTRIgine (LAMICTAL) 100 MG tablet Take 1 tablet (100 mg total) by mouth daily. Take 3 tablets by mouth in the evening with meals 90  tablet 6  . levETIRAcetam (KEPPRA) 500 MG tablet Take 2 tabs PO Q a.m and 1 tab Po Q p.m 90 tablet 6  . mirtazapine (REMERON) 30 MG tablet Take 1 tablet (30 mg total) by mouth at bedtime. 30 tablet 2  . omeprazole (PRILOSEC) 40 MG capsule Take 1 capsule (40 mg total) by mouth daily. 30 capsule 2   No current facility-administered medications for this visit.     Musculoskeletal: Strength & Muscle Tone: Unable to assess due to telephone visit Gait & Station: Unable to assess due to telephone visit Patient leans: N/A  Psychiatric Specialty Exam: Review of Systems  There were no vitals taken for this visit.There is no height or weight on file to calculate BMI.  General Appearance: Unable to assess due to telephone visit  Eye Contact:  Unable to assess due to telephone visit  Speech:  Clear and Coherent, Slow and Slurred  Volume:  Decreased  Mood:  Anxious and Depressed  Affect:  Appropriate and Congruent  Thought Process:  Coherent, Goal Directed and Linear  Orientation:   Full (Time, Place, and Person)  Thought Content: Logical and Paranoid Ideation   Suicidal Thoughts:  No  Homicidal Thoughts:  No  Memory:  Immediate;   Good Recent;   Good Remote;   Good  Judgement:  Good  Insight:  Good  Psychomotor Activity:  Unable to assess due to telephone visit  Concentration:  Concentration: Good and Attention Span: Good  Recall:  Good  Fund of Knowledge: Good  Language: Good  Akathisia:  Unable to assess due to telephone visit  Handed:  Right  AIMS (if indicated):Not done  Assets:  Communication Skills Desire for Improvement Financial Resources/Insurance Housing Social Support  ADL's:  Intact  Cognition: WNL  Sleep:  Poor   Screenings: GAD-7   Flowsheet Row [facility]  Total GAD-7 Score 15 0 21 9 5     PHQ2-9   Flowsheet Row [facility]  PHQ-2 Total Score 5 3 6 6 3   PHQ-9 Total Score 18 7 26 24 9     Flowsheet Row [facility]  C-SSRS RISK CATEGORY No Risk High Risk       Assessment and Plan: Patient endorses symptoms of depression, anxiety, and increased cannabis use.  He endorsed irritability, distractibility, and fluctuations in mood.  He notes  that his sleep is poor.  Today he is agreeable to increasing Abilify 5 mg to 10 mg to help manage mood.  He is also agreeable to increasing mirtazapine 15 mg to 30 mg to help manage anxiety, depression, and sleep.  He will also increase hydroxyzine 10 mg 3 times a day to 25 mg 3 times a day to help manage anxiety.Provider encouraged patient to reduce his cannabis consumption.  1. Substance induced mood disorder (HCC)  Increased- ARIPiprazole  10 MG TABS; Take 10 mg by mouth daily.  Dispense: 30 tablet; Refill: 2  2. Severe episode of recurrent major depressive disorder, with psychotic features (HCC)  Increased- ARIPiprazole 10 MG TABS; Take 10 mg by mouth daily.  Dispense: 30 tablet; Refill: 2 Increase- mirtazapine (REMERON) 30 MG tablet; Take 1 tablet (30 mg total) by mouth at bedtime.  Dispense: 30 tablet; Refill: 2  3. Generalized anxiety disorder  Increase- mirtazapine (REMERON) 30 MG tablet; Take 1 tablet (30 mg total) by mouth at bedtime.  Dispense: 30 tablet; Refill: 2 Increase- hydrOXYzine (ATARAX/VISTARIL) 25 MG tablet; Take 1 tablet (25 mg total) by mouth 3 (three) times daily as needed.  Dispense: 90 tablet; Refill: 2  Follow-up in 3 months Follow-up with therapy  Shanna Cisco, NP 12/18/2020, 9:01 AM

## 2020-12-30 ENCOUNTER — Ambulatory Visit (HOSPITAL_COMMUNITY): Payer: No Payment, Other | Admitting: Clinical

## 2021-01-03 ENCOUNTER — Ambulatory Visit: Payer: Self-pay | Admitting: Gastroenterology

## 2021-01-20 ENCOUNTER — Ambulatory Visit (INDEPENDENT_AMBULATORY_CARE_PROVIDER_SITE_OTHER): Payer: No Payment, Other | Admitting: Clinical

## 2021-01-20 ENCOUNTER — Other Ambulatory Visit: Payer: Self-pay

## 2021-01-20 DIAGNOSIS — F411 Generalized anxiety disorder: Secondary | ICD-10-CM
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
## 2021-02-13 ENCOUNTER — Ambulatory Visit (INDEPENDENT_AMBULATORY_CARE_PROVIDER_SITE_OTHER): Payer: Self-pay | Admitting: Gastroenterology

## 2021-02-13 VITALS — BP 130/70 | HR 84 | Ht 66.0 in | Wt 140.4 lb

## 2021-02-13 DIAGNOSIS — G8929 Other chronic pain: Secondary | ICD-10-CM

## 2021-02-13 DIAGNOSIS — F1210 Cannabis abuse, uncomplicated: Secondary | ICD-10-CM

## 2021-02-13 DIAGNOSIS — R112 Nausea with vomiting, unspecified: Secondary | ICD-10-CM

## 2021-02-13 DIAGNOSIS — R1013 Epigastric pain: Secondary | ICD-10-CM

## 2021-02-13 MED ORDER — FDGARD 25-20.75 MG PO CAPS: ORAL_CAPSULE | ORAL | 0 refills | Status: DC

## 2021-02-13 MED ORDER — SUCRALFATE 1 G PO TABS
1.0000 g | ORAL_TABLET | Freq: Four times a day (QID) | ORAL | 3 refills | Status: AC | PRN
  Filled 2021-03-05: qty 30, 8d supply, fill #0

## 2021-02-13 NOTE — Progress Notes (Signed)
HPI :  35 year old male here for follow-up visit for abdominal pain, nausea or vomiting.  This is my first time actually meeting him, he has been seen by our PAs in the past and Dr. Rhea Belton while hospitalized in December.  He states he has had abdominal symptoms going on for " a very long time".  By this he refers to frequent nausea and vomiting.  He states after eating almost everything about an hour later he will feel a come back up and he frequently vomits it.  With this he has upper abdominal pain.  He states he has pain present there all the time to some extent but the severity can fluctuate.  Some food triggers such as fried foods clearly make this worse.  He states he vomits with almost every meal.  States he has not lost any weight recently, in fact may have gained a little bit of weight.  He does have some heartburn and reflux symptoms that bother him.  He is on Prilosec but unclear how often he is taking this.  He states that the things that make him feel better or taking hot showers.  He sometimes falls asleep in a hot bath because it is what makes him feel the best to relieve his symptoms.  He smokes marijuana daily, numerous times per day, he states this makes his stomach feel better as well.  He has been told to stop marijuana in the past in light of the symptoms but he does not feel it is related.  He has been on Zofran and Phenergan in the past and states it does not really help.  He states in fact none of the medications used to treat his symptoms have helped.  He does describe capsaicin cream applied to his abdominal wall in the past and states that did not help.  I have reviewed his prior work-up.  He has had 6 CT scans of the abdomen pelvis and chest dating back to 2016.  He is also had an ultrasound of his abdomen showing no gallstones.  He has had no etiology noted on his CT scans to account for symptoms.  He was admitted for flare symptoms in December, he had an EGD with Dr. Rhea Belton which  was essentially normal.  Biopsies negative for H. pylori, duodenal biopsies normal. He is quite frustrated by his persistent symptoms and asks what else he can do.  EGD 09/03/20: Normal mucosa was found in the entire esophagus. Hypertonic LES noted, nonspecific. No stricture seen. Z-line regular. - Gastritis. Biopsied. - Normal examined duodenum. Biopsied. FINAL MICROSCOPIC DIAGNOSIS:   A. DUODENUM, BIOPSY:  - Unremarkable duodenal mucosa.  - No features of celiac sprue or granulomas.   B. STOMACH, BODY AND ANTRUM, BIOPSY:  - Antral and oxyntic mucosa with hyperemia.  - Warthin-Starry negative for Helicobacter pylori.  - No intestinal metaplasia, dysplasia or carcinoma.      CT abdomen / pelvis 11/22/20 - IMPRESSION: 1. Question of either under distension or mild thickening of the ascending colon which is segmental involving the ascending through the mid transverse colon. This may represent a mild colitis. Though is nonspecific given relative under distension 2. Distal esophageal thickening, may represent esophagitis. Correlate with recent endoscopy results and with any new symptoms. 3. Significant loss of subcutaneous fat and visceral since prior imaging in May of 2021.   CT abdomen / pelvis 02/05/20 -  IMPRESSION: Thickening of the walls of the distal esophagus may be due to inflammatory change.  The patient's CT scan is otherwise negative.  CT chest 11/08/09: IMPRESSION: 1. Stable lymph node or nodule at the inferior lingula appears pleural based. Findings most consistent with benign nodularity. No significant change from 05/17/2019. Consider follow-up CT in 12 months from current month. 2. Trace pericardial effusion is new from prior.  Unclear etiology.   RUQ [place] 06/15/19: IMPRESSION: Study within normal limits.   CT abdomen / pelvis 06/13/19: IMPRESSION: 1. Trace nonspecific right pleural effusion, with only mild bilateral atelectasis in the visible lungs. 2. No  acute or inflammatory process identified in the abdomen or pelvis. Normal appendix.  CT abdomen / pelvis 05/18/19: IMPRESSION: 1. Motion degraded study. 2. Wall thickening of the distal esophagus which can be seen in patients with esophagitis. 3. Normal appendix.  Unremarkable appearance of the gallbladder. 4. Possible new subpleural nodule as detailed above measuring approximately 1.4 cm. This is not well visualized on prior CT from January 26, 2015. A three-month follow-up CT of the chest is recommended to confirm stability or resolution of this finding. 5. Small amount of fluid in the right inguinal canal of unknown clinical significance.  CT abdomen / pelvis 01/26/2015: IMPRESSION: 1. No acute abnormality seen within the abdomen or pelvis. 2. 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        Past Medical History:  Diagnosis Date  . Chronic abdominal pain   . Medically noncompliant   . Polysubstance abuse (HCC)   . Seizures (HCC)    since childhood     Past Surgical History:  Procedure Laterality Date  . BIOPSY  09/03/2020   Procedure: BIOPSY;  Surgeon: Beverley Fiedler, MD;  Location: [facility];  Service: Gastroenterology;;  . ESOPHAGOGASTRODUODENOSCOPY    . ESOPHAGOGASTRODUODENOSCOPY (EGD) WITH PROPOFOL N/A 09/03/2020   Procedure: ESOPHAGOGASTRODUODENOSCOPY (EGD) WITH PROPOFOL;  Surgeon: Beverley Fiedler, MD;  Location: [facility];  Service: Gastroenterology;  Laterality: N/A;   Family History  Problem Relation Age of Onset  . Hypertension Other   . Seizures Other   . Seizures Maternal Grandmother    Social History   Tobacco Use  . Smoking status: Current Every Day Smoker    Packs/day: 1.00    Years: 5.00    Pack years: 5.00    Types: Cigarettes  . Smokeless tobacco: Never Used  Vaping Use  . Vaping Use: Never used  Substance Use Topics  . Alcohol use: Not Currently  . Drug use: Not Currently    Types: Marijuana   Current Outpatient Medications  Medication  Sig Dispense Refill  . Caraway Oil-Levomenthol (FDGARD) 25-20.75 MG CAPS Take as directed as needed 16 capsule 0  . sucralfate (CARAFATE) 1 g tablet Take 1 tablet (1 g total) by mouth every 6 (six) hours as needed. 30 tablet 3  . ARIPiprazole (ABILIFY) 10 MG tablet TAKE 1 TABLET BY MOUTH ONCE A DAY 30 tablet 2  . ARIPiprazole, sensor, 10 MG TABS Take 10 mg by mouth daily. 30 tablet 2  . hydrOXYzine (ATARAX/VISTARIL) 25 MG tablet TAKE 1 TABLET (25 MG TOTAL) BY MOUTH 3 (THREE) TIMES DAILY AS NEEDED. 90 tablet 2  . lamoTRIgine (LAMICTAL) 100 MG tablet Take 1 tablet (100 mg total) by mouth daily. Take 3 tablets by mouth in the evening with meals 90 tablet 6  . levETIRAcetam (KEPPRA) 1000 MG tablet TAKE 1 TABLET BY MOUTH EVERY MORNING & 1/2 TABLET EVERY EVENING 45 tablet 5  . levETIRAcetam (KEPPRA) 500 MG tablet Take 2 tabs PO Q a.m and 1  tab Po Q p.m 90 tablet 6  . mirtazapine (REMERON) 30 MG tablet TAKE 1 TABLET (30 MG TOTAL) BY MOUTH AT BEDTIME. 30 tablet 2  . omeprazole (PRILOSEC) 40 MG capsule Take 1 capsule (40 mg total) by mouth daily. 30 capsule 2   No current facility-administered medications for this visit.   Not on File   Review of Systems: All systems reviewed and negative except where noted in HPI.   Lab Results  Component Value Date   WBC 5.7 09/03/2020   HGB 14.4 09/03/2020   HCT 43.7 09/03/2020   MCV 96.0 09/03/2020   PLT 257 09/03/2020    Lab Results  Component Value Date   CREATININE 0.94 09/03/2020   BUN 6 09/03/2020   NA 140 09/03/2020   K 3.3 (L) 09/03/2020   CL 105 09/03/2020   CO2 25 09/03/2020    Lab Results  Component Value Date   ALT 19 09/03/2020   AST 14 (L) 09/03/2020   ALKPHOS 48 09/03/2020   BILITOT 0.8 09/03/2020    Lab Results  Component Value Date   LIPASE 41 09/03/2020    Physical Exam: BP 130/70   Pulse 84   Ht 5\' 6"  (1.676 m)   Wt 140 lb 6.4 oz (63.7 kg)   SpO2 97%   BMI 22.66 kg/m  Constitutional: Pleasant, male in no acute  distress. Abdominal: Soft, nondistended, epigastric tenderness to palpation.  There are no masses palpable.  Extremities: no edema Lymphadenopathy: No cervical adenopathy noted. Neurological: Alert and oriented to person place and time. Skin: Skin is warm and dry. No rashes noted. Psychiatric: Normal mood and affect. Behavior is normal.   ASSESSMENT AND PLAN: 35 year old male here for reassessment following:  Refractory nausea and vomiting Abdominal pain Marijuana abuse  I reviewed his work-up with him and his symptoms today.  I suspect he very likely has cannabinoid hyperemesis syndrome and I discussed what this was at length with him today.  He reliably finds relief with hot baths, sometimes falls asleep in the bathtub trying to relieve his symptoms.  He smokes marijuana frequently and recommend he stop all marijuana.  I discussed reasoning behind this and provided him literature on cannabinoid hyperemesis syndrome.  He states he will try to stop all marijuana for at least a few months and see if he feels better.  Otherwise to ensure nothing else going on I offered him a gastric emptying study to assess for gastroparesis.  He wanted to pursue this.  I will give him a trial of FD guard to treat component of dyspepsia in the interim as well as some Carafate empirically to see if that provides any benefit.  He agreed with the plan, all questions answered  31, MD [facility]enterology

## 2021-02-13 NOTE — Patient Instructions (Signed)
If you are age 36 or older, your body mass index should be between 23-30. Your Body mass index is 22.66 kg/m. If this is out of the aforementioned range listed, please consider follow up with your Primary Care Provider.  If you are age 58 or younger, your body mass index should be between 19-25. Your Body mass index is 22.66 kg/m. If this is out of the aformentioned range listed, please consider follow up with your Primary Care Provider.   You have been scheduled for a gastric emptying scan at [facility] Admitting on Monday, 03-03-21 at 7:30am. Please arrive at least 15 minutes prior to your appointment for registration. Please make certain not to have anything to eat or drink after midnight the night before your test. Hold all stomach medications (ex: Zofran, phenergan, Reglan) 48 hours prior to your test. If you need to reschedule your appointment, please contact radiology scheduling at [phone]. _____________________________________________________________________ A gastric-emptying study measures how long it takes for food to move through your stomach. There are several ways to measure stomach emptying. In the most common test, you eat food that contains a small amount of radioactive material. A scanner that detects the movement of the radioactive material is placed over your abdomen to monitor the rate at which food leaves your stomach. This test normally takes about 4 hours to complete. _____________________________________________________________________  We have given you samples of the following medication to take: FDGard: Take as directed as needed   We have sent the following medications to your pharmacy for you to pick up at your convenience: Carafate tablets: Take 1 tablet every 6 hours as needed   We are giving you a handout today on Cannabinoid Hyperemesis Syndrome.   Thank you for entrusting me with your care and for choosing [facility], Dr. Ileene Patrick

## 2021-02-19 ENCOUNTER — Other Ambulatory Visit: Payer: Self-pay

## 2021-03-03 ENCOUNTER — Other Ambulatory Visit: Payer: Self-pay

## 2021-03-03 ENCOUNTER — Ambulatory Visit (HOSPITAL_COMMUNITY)
Admission: RE | Admit: 2021-03-03 | Discharge: 2021-03-03 | Disposition: A | Discharge status: Home / Self Care | Payer: Self-pay | Source: Ambulatory Visit | Attending: Gastroenterology | Admitting: Gastroenterology

## 2021-03-03 DIAGNOSIS — R112 Nausea with vomiting, unspecified: Secondary | ICD-10-CM | POA: Insufficient documentation

## 2021-03-03 MED ORDER — TECHNETIUM TC 99M SULFUR COLLOID
2.0000 | Freq: Once | INTRAVENOUS | Status: AC | PRN
  Administered 2021-03-03: 2 via ORAL

## 2021-03-05 ENCOUNTER — Telehealth: Payer: Self-pay | Admitting: Internal Medicine

## 2021-03-05 ENCOUNTER — Other Ambulatory Visit: Payer: Self-pay

## 2021-03-05 NOTE — Telephone Encounter (Signed)
GI provider sent this rx with refills last month to [facility] pharmacy. Patient will need to transfer to our pharmacy. I have notified [facility] pharmacy and they are going to transfer the medication.

## 2021-03-05 NOTE — Telephone Encounter (Signed)
Medication Refill - Medication: sucralfate (CARAFATE) 1 g tablet    Has the patient contacted their pharmacy? Yes.   (Agent: If no, request that the patient contact the pharmacy for the refill.) (Agent: If yes, when and what did the pharmacy advise?)do not see on chart  Preferred Pharmacy (with phone number or street name): [facility]  [address], [place] [address]  Phone:  [phone] Fax:  [phone]   Agent: Please be advised that RX refills may take up to 3 business days. We ask that you follow-up with your pharmacy.

## 2021-03-07 ENCOUNTER — Other Ambulatory Visit: Payer: Self-pay

## 2021-03-11 ENCOUNTER — Ambulatory Visit (HOSPITAL_COMMUNITY): Payer: No Payment, Other | Admitting: Clinical

## 2021-03-11 ENCOUNTER — Other Ambulatory Visit: Payer: Self-pay

## 2021-03-11 MED ORDER — METOCLOPRAMIDE HCL 5 MG PO TABS
5.0000 mg | ORAL_TABLET | Freq: Three times a day (TID) | ORAL | 1 refills | Status: AC
  Filled 2021-03-11: qty 90, 30d supply, fill #0

## 2021-03-12 ENCOUNTER — Other Ambulatory Visit: Payer: Self-pay

## 2021-03-13 ENCOUNTER — Encounter (HOSPITAL_COMMUNITY): Payer: Self-pay | Admitting: Psychiatry

## 2021-03-13 ENCOUNTER — Other Ambulatory Visit: Payer: Self-pay

## 2021-03-13 ENCOUNTER — Telehealth (INDEPENDENT_AMBULATORY_CARE_PROVIDER_SITE_OTHER): Payer: No Payment, Other | Admitting: Psychiatry

## 2021-03-13 ENCOUNTER — Encounter (HOSPITAL_COMMUNITY): Payer: Self-pay

## 2021-03-13 ENCOUNTER — Ambulatory Visit (HOSPITAL_COMMUNITY): Payer: No Payment, Other | Admitting: *Deleted

## 2021-03-13 VITALS — BP 120/85 | HR 78 | Ht 66.0 in | Wt 138.0 lb

## 2021-03-13 DIAGNOSIS — R112 Nausea with vomiting, unspecified: Secondary | ICD-10-CM | POA: Diagnosis not present

## 2021-03-13 DIAGNOSIS — F1994 Other psychoactive substance use, unspecified with psychoactive substance-induced mood disorder: Secondary | ICD-10-CM | POA: Diagnosis not present

## 2021-03-13 DIAGNOSIS — F333 Major depressive disorder, recurrent, severe with psychotic symptoms: Secondary | ICD-10-CM

## 2021-03-13 DIAGNOSIS — R569 Unspecified convulsions: Secondary | ICD-10-CM

## 2021-03-13 DIAGNOSIS — F411 Generalized anxiety disorder: Secondary | ICD-10-CM

## 2021-03-13 DIAGNOSIS — F1290 Cannabis use, unspecified, uncomplicated: Secondary | ICD-10-CM

## 2021-03-13 MED ORDER — ARIPIPRAZOLE ER 400 MG IM PRSY
400.0000 mg | PREFILLED_SYRINGE | INTRAMUSCULAR | 11 refills | Status: DC
  Filled 2021-03-13: qty 1, 28d supply, fill #0

## 2021-03-13 MED ORDER — MIRTAZAPINE 30 MG PO TABS
ORAL_TABLET | ORAL | 2 refills | Status: AC
  Filled 2021-03-13: qty 30, 30d supply, fill #0

## 2021-03-13 MED ORDER — HYDROXYZINE HCL 25 MG PO TABS
ORAL_TABLET | Freq: Three times a day (TID) | ORAL | 2 refills | Status: AC | PRN
  Filled 2021-03-13: qty 90, 30d supply, fill #0

## 2021-03-13 MED ORDER — ARIPIPRAZOLE ER 400 MG IM PRSY
400.0000 mg | PREFILLED_SYRINGE | Freq: Once | INTRAMUSCULAR | Status: AC
  Administered 2021-03-13: 400 mg via INTRAMUSCULAR

## 2021-03-13 MED ORDER — ARIPIPRAZOLE ER 400 MG IM PRSY
400.0000 mg | PREFILLED_SYRINGE | INTRAMUSCULAR | 11 refills | Status: DC
Start: 2021-03-13 — End: 2021-03-13

## 2021-03-13 NOTE — Progress Notes (Signed)
In per the request of Dr Doyne Keel as she is starting him on Abilify M 400 mg. Given today in his R deltoid without difficulty. This is his first injection, he has been taking oral Abilify but this is his first injection. He has two cell phones and thru out his appt very focused on the phones. He completed PAP paperwork and was given a sample injection today until his patient assistance medicine is available . He reported when asked how he was doing today " not so good, the voices are bothering me" Med ed provided re the injection. He is to return in one month for his next injection. Pleasant and cooperative. Endorses frequent alcohol and marijuana use.

## 2021-03-13 NOTE — Progress Notes (Signed)
BH MD/PA/NP OP Progress Note Virtual Visit via Video Note  I connected with Greg Morales on 03/13/21 at  8:30 AM EDT by a video enabled telemedicine application and verified that I am speaking with the correct person using two identifiers.  Location: Patient: Home Provider: Clinic   I discussed the limitations of evaluation and management by telemedicine and the availability of in person appointments. The patient expressed understanding and agreed to proceed.  I provided  minutes of non-face-to-face time during this encounter.        03/13/2021 9:28 AM Greg MinionWilliam A Wlodarczyk  MRN:  [id number]  Chief Complaint: "I have stomach issues and my medications upset my stomach sometimes"  HPI:  35 year old male seen today for follow up psychiatric evaluation.  He has a psychiatric history of substance-induced mood disorder, bipolar disorder, tobacco dependence, and marijuana use.  He is currently managed on Remeron 30 mg nightly and Abilify 6929m g daily.  He is prescribed Lamictal by his primary care doctor for a seizure disorder however notes he no longer takes it.  Inform Clinical research associatewriter that he takes Keppra however reports that it is ineffective.  He notes his psychiatric medications are somewhat effective in managing his psychiatric conditions.     Today patient is well-groomed, pleasant, cooperative, and engaged in conversation.  He informed provider that he takes his psychiatric medications periodically because sometimes they upset his stomach.  He notes that he takes it when he feels like he is getting out of control and reports that this is approximately twice a week.  Patient notes that he has several life stressors that exacerbate his mental health.  He notes currently he is living in a hotel, is not financially stable, is unemployed, and would like to receive disability however having issues maintaining his healthcare records.  He requested provider print off his psychiatric records.  Today provider  conducted a GAD-7 and patient scored a 17, at his last visit he scored a 15.  Provider also conducted a PHQ-9 he scored a 10, at his last visit he scored an 1418.  He informed Clinical research associatewriter that his sleep continues to be poor noting that he sleeps 2 to 3 hours nightly.  He informed Clinical research associatewriter he is afraid to sleep because he has nightmares.  Provider asked patient if he was interested in starting prazosin however he notes that he does not want any other medications.     Patient notes that his mood has somewhat improved since starting Abilify however notes that at times he still has fluctuations in mood, racing thoughts, irritability, and paranoia.  He notes that he is paranoid that he will lose things or that someone will hurt him.  Patient notes that he continues to have visual hallucinations noting that he sees waves and color patterns.  He denies auditory hallucinations noting that when he did he had them control him out with music.  Today he denies SI/HI.  Patient informed Clinical research associatewriter that he continues to be in pain.  He notes that yesterday he bit into a mint and cracked his tooth.  He also notes that he has stomach pain however reports that it is managed on medications.  He informed Clinical research associatewriter that he would like to be seen by neurology.  He notes that he has not been seen by a neurologist in over 5 years when he was in prison.  Patient has documented ED visit for seizure-like activities spanning the course of 9 years.  He informed Clinical research associatewriter that  he continues to have seizures that interfere with his life.  He notes that his last seizure was 3 days ago and notes that he lost consciousness and became very forgetful when he woke up.   Patient notes that he is nauseous most days.  Provider asked patient if he continues to smoke marijuana.  He notes that he smokes marijuana daily however notes that he feels that it helps with his anxiety and mental health conditions.  Provider informed patient that marijuana can worsen his symptoms  and cause hyperemesis.  He endorsed understanding however notes that he disagreed and reports that it helps him.   Provider asked patient if he was interested in starting Abilify maintainer 400 mg monthly injections.  He notes that he was as it would help with his compliance.  At this time he notes that he does not want prazosin to help with nightmares but is agreeable to continuing all other medications as prescribed.  Patient will receive his first injection today at the clinic.  He will follow-up with provider in 3 months for further evaluation.  No other concerns at this time.  Visit Diagnosis:    ICD-10-CM   1. Substance induced mood disorder (HCC)  F19.94 mirtazapine (REMERON) 30 MG tablet    ARIPiprazole ER (ABILIFY MAINTENA) 400 MG prefilled syringe 400 mg    ARIPiprazole ER (ABILIFY MAINTENA) 400 MG PRSY prefilled syringe    2. Generalized anxiety disorder  F41.1 hydrOXYzine (ATARAX/VISTARIL) 25 MG tablet    mirtazapine (REMERON) 30 MG tablet    3. Seizures (HCC)  R56.9 Ambulatory referral to Neurology    4. Cannabinoid hyperemesis syndrome  R11.2    F12.90       Past Psychiatric History:  substance-induced mood disorder, bipolar disorder, tobacco dependence, and marijuana use  Past Medical History:  Past Medical History:  Diagnosis Date   Chronic abdominal pain    Medically noncompliant    Polysubstance abuse (HCC)    Seizures (HCC)    since childhood    Past Surgical History:  Procedure Laterality Date   BIOPSY  09/03/2020   Procedure: BIOPSY;  Surgeon: Beverley Fiedler, MD;  Location: [facility];  Service: Gastroenterology;;   ESOPHAGOGASTRODUODENOSCOPY     ESOPHAGOGASTRODUODENOSCOPY (EGD) WITH PROPOFOL N/A 09/03/2020   Procedure: ESOPHAGOGASTRODUODENOSCOPY (EGD) WITH PROPOFOL;  Surgeon: Beverley Fiedler, MD;  Location: [facility];  Service: Gastroenterology;  Laterality: N/A;    Family Psychiatric History: Father bipolar and schizophrenia and paternal grandmother  bipolar and schizopohrenia  Family History:  Family History  Problem Relation Age of Onset   Hypertension Other    Seizures Other    Seizures Maternal Grandmother     Social History:  Social History   Socioeconomic History   Marital status: Married    Spouse name: Dois Davenport   Number of children: 3   Years of education: Not on file   Highest education level: Not on file  Occupational History   Not on file  Tobacco Use   Smoking status: Every Day    Packs/day: 1.00    Years: 5.00    Pack years: 5.00    Types: Cigarettes   Smokeless tobacco: Never  Vaping Use   Vaping Use: Never used  Substance and Sexual Activity   Alcohol use: Not Currently   Drug use: Not Currently    Types: Marijuana   Sexual activity: Not Currently  Other Topics Concern   Not on file  Social History Narrative   History of seizures  since childhood per patient   Social Determinants of Health   Financial Resource Strain: Not on file  Food Insecurity: Not on file  Transportation Needs: Not on file  Physical Activity: Not on file  Stress: Not on file  Social Connections: Not on file    Allergies: Not on File  Metabolic Disorder Labs: No results found for: HGBA1C, MPG No results found for: PROLACTIN No results found for: CHOL, TRIG, HDL, CHOLHDL, VLDL, LDLCALC Lab Results  Component Value Date   TSH 0.636 06/07/2010    Therapeutic Level Labs: No results found for: LITHIUM Lab Results  Component Value Date   VALPROATE 59 04/19/2015   No components found for:  CBMZ  Current Medications: Current Outpatient Medications  Medication Sig Dispense Refill   ARIPiprazole ER (ABILIFY MAINTENA) 400 MG PRSY prefilled syringe Inject 400 mg into the muscle every 28 (twenty-eight) days. 1 each 11   Caraway Oil-Levomenthol (FDGARD) 25-20.75 MG CAPS Take as directed as needed 16 capsule 0   hydrOXYzine (ATARAX/VISTARIL) 25 MG tablet TAKE 1 TABLET (25 MG TOTAL) BY MOUTH 3 (THREE) TIMES DAILY AS NEEDED.  90 tablet 2   lamoTRIgine (LAMICTAL) 100 MG tablet Take 1 tablet (100 mg total) by mouth daily. Take 3 tablets by mouth in the evening with meals 90 tablet 6   levETIRAcetam (KEPPRA) 1000 MG tablet TAKE 1 TABLET BY MOUTH EVERY MORNING & 1/2 TABLET EVERY EVENING 45 tablet 5   levETIRAcetam (KEPPRA) 500 MG tablet Take 2 tabs PO Q a.m and 1 tab Po Q p.m 90 tablet 6   metoCLOPramide (REGLAN) 5 MG tablet Take 1 tablet (5 mg total) by mouth 3 (three) times daily before meals. 90 tablet 1   mirtazapine (REMERON) 30 MG tablet TAKE 1 TABLET (30 MG TOTAL) BY MOUTH AT BEDTIME. 30 tablet 2   omeprazole (PRILOSEC) 40 MG capsule Take 1 capsule (40 mg total) by mouth daily. 60 capsule 2   sucralfate (CARAFATE) 1 g tablet Take 1 tablet (1 g total) by mouth every 6 (six) hours as needed. 30 tablet 3   Current Facility-Administered Medications  Medication Dose Route Frequency Provider Last Rate Last Admin   ARIPiprazole ER (ABILIFY MAINTENA) 400 MG prefilled syringe 400 mg  400 mg Intramuscular Once Shanna Cisco, NP         Musculoskeletal: Strength & Muscle Tone:  Unable to assess due to teleehealth visit Gait & Station:  Unable to assess due to telehealth visit Patient leans: N/A  Psychiatric Specialty Exam: Review of Systems  There were no vitals taken for this visit.There is no height or weight on file to calculate BMI.  General Appearance: Well Groomed  Eye Contact:  Good  Speech:  Clear and Coherent, Slow and Slurred  Volume:  Decreased  Mood:  Anxious and Depressed  Affect:  Appropriate and Congruent  Thought Process:  Coherent, Goal Directed and Linear  Orientation:  Full (Time, Place, and Person)  Thought Content: Logical, Hallucinations: Visual, and Paranoid Ideation   Suicidal Thoughts:  No  Homicidal Thoughts:  No  Memory:  Immediate;   Good Recent;   Good Remote;   Good  Judgement:  Good  Insight:  Good  Psychomotor Activity:  Normal  Concentration:  Concentration: Good and  Attention Span: Good  Recall:  Good  Fund of Knowledge: Good  Language: Good  Akathisia:  No  Handed:  Right  AIMS (if indicated):Not done  Assets:  Communication Skills Desire for Improvement Financial Resources/Insurance Housing Social  Support  ADL's:  Intact  Cognition: WNL  Sleep:  Poor   Screenings: GAD-7    Flowsheet Row Video Visit from 03/13/2021 in [facility] Video Visit from 12/18/2020 in [facility] Office Visit from 10/28/2020 in [facility] Clinical Support from 09/16/2020 in [facility] Office Visit from 11/03/2019 in [facility]  Total GAD-7 Score 17 15 0 21 9      PHQ2-9    Flowsheet Row Video Visit from 03/13/2021 in [facility] Video Visit from 12/18/2020 in [facility] Office Visit from 10/28/2020 in [facility] Clinical Support from 09/16/2020 in [facility] ED from 08/29/2020 in [facility]  PHQ-2 Total Score 1 5 3 6 6   PHQ-9 Total Score 10 18 7 26 24       Flowsheet Row Video Visit from 03/13/2021 in [facility] Video Visit from 12/18/2020 in [facility] ED from 08/29/2020 in [facility]  C-SSRS RISK CATEGORY No Risk No Risk High Risk        Assessment and Plan: Patient endorses symptoms of depression, anxiety, paranoia, visual hallucinations, and increased cannabis use.  Provider asked patient if he was interested in starting Abilify maintainer 400 mg monthly injections.  He notes that he was as it would help with his compliance.  At this time he notes that he does not want prazosin to help with nightmares but is agreeable to continuing all other medications as prescribed.  Patient will receive  his first injection today at the clinic.  Patient also notes that he continues to have seizure-like activities.  Patient referred to neurology for further evaluation.   1. Generalized anxiety disorder  Continue- hydrOXYzine (ATARAX/VISTARIL) 25 MG tablet; TAKE 1 TABLET (25 MG TOTAL) BY MOUTH 3 (THREE) TIMES DAILY AS NEEDED.  Dispense: 90 tablet; Refill: 2 Continue- mirtazapine (REMERON) 30 MG tablet; TAKE 1 TABLET (30 MG TOTAL) BY MOUTH AT BEDTIME.  Dispense: 30 tablet; Refill: 2  2. Substance induced mood disorder (HCC)  Continue- mirtazapine (REMERON) 30 MG tablet; TAKE 1 TABLET (30 MG TOTAL) BY MOUTH AT BEDTIME.  Dispense: 30 tablet; Refill: 2 Start- ARIPiprazole ER (ABILIFY MAINTENA) 400 MG prefilled syringe 400 mg Start- ARIPiprazole ER (ABILIFY MAINTENA) 400 MG PRSY prefilled syringe; Inject 400 mg into the muscle every 28 (twenty-eight) days.  Dispense: 1 each; Refill: 11  3. Seizures (HCC)  - Ambulatory referral to Neurology  4. Cannabinoid hyperemesis syndrome     Follow-up in 3 months Follow-up with therapy  14/10/2019, NP 03/13/2021, 9:28 AM

## 2021-03-20 ENCOUNTER — Other Ambulatory Visit: Payer: Self-pay

## 2021-04-03 ENCOUNTER — Ambulatory Visit (HOSPITAL_COMMUNITY): Payer: Self-pay | Admitting: Clinical

## 2021-04-10 ENCOUNTER — Other Ambulatory Visit: Payer: Self-pay

## 2021-04-10 ENCOUNTER — Ambulatory Visit (HOSPITAL_COMMUNITY): Payer: No Payment, Other | Admitting: *Deleted

## 2021-04-10 VITALS — BP 129/86 | HR 80 | Ht 66.0 in | Wt 135.0 lb

## 2021-04-10 DIAGNOSIS — F333 Major depressive disorder, recurrent, severe with psychotic symptoms: Secondary | ICD-10-CM

## 2021-04-10 MED ORDER — ARIPIPRAZOLE ER 400 MG IM PRSY
400.0000 mg | PREFILLED_SYRINGE | INTRAMUSCULAR | Status: AC
  Administered 2021-04-10 – 2021-05-13 (×2): 400 mg via INTRAMUSCULAR

## 2021-04-10 NOTE — Progress Notes (Signed)
In as scheduled for his monthly injection of Abilify M  400 mg. Today is his second injection at this clinic. He received his shot today in his L DELTOID without difficulty. He was on his phone the entire appt with his wife. He said the shot for the last month made him feel calm. No complaints offered. He is to return for his next injeciton in one month.

## 2021-04-22 ENCOUNTER — Encounter: Payer: Self-pay | Admitting: Neurology

## 2021-04-22 ENCOUNTER — Ambulatory Visit (INDEPENDENT_AMBULATORY_CARE_PROVIDER_SITE_OTHER): Payer: Self-pay | Admitting: Neurology

## 2021-04-22 VITALS — BP 125/79 | HR 92 | Ht 66.0 in | Wt 132.3 lb

## 2021-04-22 DIAGNOSIS — R569 Unspecified convulsions: Secondary | ICD-10-CM

## 2021-04-22 MED ORDER — LEVETIRACETAM 500 MG PO TABS
1000.0000 mg | ORAL_TABLET | Freq: Two times a day (BID) | ORAL | 11 refills | Status: DC
  Filled 2021-04-22: qty 120, 30d supply, fill #0
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
## 2021-04-22 NOTE — Patient Instructions (Addendum)
Increase Levetiracetam to 1000mg  twice a day  Will obtain a Levetiracetam level today  Routine EEG to assess for brain activity  MRI Brain with and without contrast, seizure protocol  Routine to clinic in 3 months or sooner if worse    Per [facility] statutes, patients with seizures are not allowed to drive until they have been seizure-free for six months.  Other recommendations include using caution when using heavy equipment or power tools. Avoid working on ladders or at heights. Take showers instead of baths.  Do not swim alone.  Ensure the water temperature is not too high on the home water heater. Do not go swimming alone. Do not lock yourself in a room alone (i.e. bathroom). When caring for infants or small children, sit down when holding, feeding, or changing them to minimize risk of injury to the child in the event you have a seizure. Maintain good sleep hygiene. Avoid alcohol.  Also recommend adequate sleep, hydration, good diet and minimize stress

## 2021-04-23 ENCOUNTER — Other Ambulatory Visit: Payer: Self-pay

## 2021-04-28 ENCOUNTER — Ambulatory Visit: Payer: Self-pay | Admitting: Neurology

## 2021-04-30 ENCOUNTER — Other Ambulatory Visit: Payer: Self-pay

## 2021-05-06 ENCOUNTER — Other Ambulatory Visit: Payer: Self-pay

## 2021-05-07 ENCOUNTER — Telehealth: Payer: Self-pay | Admitting: Neurology

## 2021-05-07 NOTE — Telephone Encounter (Signed)
EEG order sent to [facility] due to staffing restraints at [facility]. They will call patient to schedule. 

## 2021-05-08 ENCOUNTER — Ambulatory Visit (HOSPITAL_COMMUNITY): Payer: No Payment, Other

## 2021-05-13 ENCOUNTER — Ambulatory Visit (INDEPENDENT_AMBULATORY_CARE_PROVIDER_SITE_OTHER): Payer: No Payment, Other | Admitting: Family

## 2021-05-13 ENCOUNTER — Encounter (HOSPITAL_COMMUNITY): Payer: Self-pay | Admitting: Family

## 2021-05-13 ENCOUNTER — Other Ambulatory Visit: Payer: Self-pay

## 2021-05-13 ENCOUNTER — Encounter (HOSPITAL_COMMUNITY): Payer: Self-pay

## 2021-05-13 ENCOUNTER — Ambulatory Visit (HOSPITAL_COMMUNITY): Payer: No Payment, Other

## 2021-05-13 ENCOUNTER — Ambulatory Visit (HOSPITAL_COMMUNITY): Payer: No Payment, Other | Admitting: *Deleted

## 2021-05-13 VITALS — BP 130/100 | HR 75 | Ht 66.0 in | Wt 138.0 lb

## 2021-05-13 DIAGNOSIS — F333 Major depressive disorder, recurrent, severe with psychotic symptoms: Secondary | ICD-10-CM

## 2021-05-13 MED ORDER — ARIPIPRAZOLE ER 400 MG IM PRSY: 400.0000 mg | PREFILLED_SYRINGE | Freq: Once | INTRAMUSCULAR | Status: DC

## 2021-05-13 NOTE — Progress Notes (Signed)
In as scheduled to see both the provider and to get his Abilify M injection too. Today he got his 400 mg injection in his R DELTOID without difficulty. He saw the provider, Doran Heater before getting his shot for his 3 month assessment. He offers no complaints, As is his usual behavior he stays on his phone doing face time the entire appt. He is to return in one month for his next injection.

## 2021-05-13 NOTE — Progress Notes (Signed)
BH MD/PA/NP OP Progress Note  05/13/2021 10:33 AM Greg Morales  MRN:  [id number]  Chief Complaint:  Chief Complaint   Depression    HPI:  Greg Morales "Greg Morales" is a 35 year old male seen today for follow-up psychiatric evaluation. Psychiatric history includes substance-induced mood disorder, bipolar disorder, tobacco dependence and marijuana use.  He has been managed historically on Remeron 30 mg nightly and Abilify 10 mg daily. Reports he discontinued these medications with initial dose of Abilify Maintena, 02/2021. He reports feeling that his mood is managed effectively by LAI. Additionally he is followed by outpatient therapy, feels this is therapeutic as well.  He endorses history of seizure disorder, reports compliance with Keppra. No seizures reported during last 6 months, "at least."  Patient states "I know that I need my medicine, I have been "snapping" at people a lot."   Patient is assessed face-to-face by nurse practitioner, chart reviewed.    Today patient is seated, no acute distress. He is appropriately groomed. He is alert and oriented, pleasant and cooperative, he is marginally engaged during assessment, noted to be frequently distracted by cell phone. He has clear and coherent speech average volume.  Behavior calm and appropriate, with good eye contact. He does excuse himself to speak on cell phone x2 during assessment.  He reports irritable mood increasingly during the last two weeks. Mood today appears euthymic, congruent affect.   Today provider conducted a GAD-7, patient scored a 16, at last visit he scored a 17. Provider also conducted a PHQ-9, he scored a 14, at last visit he scored a 10. He denies suicidal and homicidal ideations currently.  He contracts verbally for safety with this Clinical research associate.  He reports suicidal ideations increase when "I have nowhere to go, when things are hard."He reports recent stressors include "my living situation, I am basically homeless and I have  been sleeping in my car at times."  He reports current unstable housing/financial situation contribute to suicidal ideation.     He denies both auditory and visual hallucinations.  There is no indication that he is responding to internal stimuli, no evidence of delusional thought content.  Patient is able to converse coherently with goal-directed thoughts and no distractibility or preoccupation. He denies paranoia.  Objectively there is no evidence of psychosis/mania or delusional thinking. Patient is insightful regarding treatment and diagnosis.   Patient is tolerating medications with no adverse effects/reactions per his report.  Patient reports average sleep,reports this is an improvement. No nightmares reported recently.   He denies physical pain currently.  He denies both alcohol and substance use in the last two months. 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   Patient provided support and encouragement.  Patient to receive monthly [facility]I Abilify Maintena 400mg  monthly. Plan to follow-up in one month for Abilify Maintena monthly injection.   Visit Diagnosis:    ICD-10-CM   1. History of bipolar disorder  Z86.59       Past Psychiatric History: generalized anxiety disorder, major depressive disorder, recurrent severe with psychotic features, subs  Past Medical History:  Past Medical History:  Diagnosis Date   Bipolar disorder ([facility])    Chronic abdominal pain    Medically noncompliant    Polysubstance abuse ([facility])    Seizures ([facility])    since childhood    Past Surgical  History:  Procedure Laterality Date   BIOPSY  09/03/2020   Procedure: BIOPSY;  Surgeon: Beverley Fiedler, MD;  Location: [facility];  Service: Gastroenterology;;   ESOPHAGOGASTRODUODENOSCOPY     ESOPHAGOGASTRODUODENOSCOPY (EGD)  WITH PROPOFOL N/A 09/03/2020   Procedure: ESOPHAGOGASTRODUODENOSCOPY (EGD) WITH PROPOFOL;  Surgeon: Beverley Fiedler, MD;  Location: [facility];  Service: Gastroenterology;  Laterality: N/A;    Family Psychiatric History: none reported  Family History:  Family History  Problem Relation Age of Onset   Other Mother        unknown medical history   Other Father        unknown medical history   Seizures Maternal Grandmother    Hypertension Other    Seizures Other     Social History:  Social History   Socioeconomic History   Marital status: Married    Spouse name: Dois Davenport   Number of children: 3   Years of education: Not on file   Highest education level: Not on file  Occupational History   Occupation: Currently seeking disability  Tobacco Use   Smoking status: Every Day    Packs/day: 1.00    Years: 5.00    Pack years: 5.00    Types: Cigarettes   Smokeless tobacco: Never  Vaping Use   Vaping Use: Never used  Substance and Sexual Activity   Alcohol use: Not Currently   Drug use: Yes    Types: Marijuana    Comment: uses three times weekly   Sexual activity: Not Currently  Other Topics Concern   Not on file  Social History Narrative   History of seizures since childhood per patient.   Lives at home with his family.   Right-handed.   No daily caffeine use.   Social Determinants of Health   Financial Resource Strain: Not on file  Food Insecurity: Not on file  Transportation Needs: Not on file  Physical Activity: Not on file  Stress: Not on file  Social Connections: Not on file    Allergies: No Known Allergies  Metabolic Disorder Labs: No results found for: HGBA1C, MPG No results found for: PROLACTIN No results found for: CHOL, TRIG, HDL, CHOLHDL, VLDL, LDLCALC Lab Results  Component Value Date   TSH 0.636 06/07/2010    Therapeutic Level Labs: No results found for: LITHIUM Lab Results  Component Value Date   VALPROATE 59 04/19/2015   No components  found for:  CBMZ  Current Medications: Current Outpatient Medications  Medication Sig Dispense Refill   ARIPiprazole ER (ABILIFY MAINTENA) 400 MG PRSY prefilled syringe Inject 400 mg into the muscle every 28 (twenty-eight) days. 1 each 11   Caraway Oil-Levomenthol (FDGARD) 25-20.75 MG CAPS Take as directed as needed 16 capsule 0   hydrOXYzine (ATARAX/VISTARIL) 25 MG tablet TAKE 1 TABLET (25 MG TOTAL) BY MOUTH 3 (THREE) TIMES DAILY AS NEEDED. 90 tablet 2   lamoTRIgine (LAMICTAL) 100 MG tablet Take 1 tablet (100 mg total) by mouth daily. Take 3 tablets by mouth in the evening with meals 90 tablet 6   levETIRAcetam (KEPPRA) 500 MG tablet Take 2 tablets (1,000 mg total) by mouth 2 (two) times daily. 120 tablet 11   metoCLOPramide (REGLAN) 5 MG tablet Take 1 tablet (5 mg total) by mouth 3 (three) times daily before meals. 90 tablet 1   mirtazapine (REMERON) 30 MG tablet TAKE 1 TABLET (30 MG TOTAL) BY MOUTH AT BEDTIME. 30 tablet 2   omeprazole (PRILOSEC) 40 MG capsule Take 1 capsule (40 mg total)  by mouth daily. 60 capsule 2   sucralfate (CARAFATE) 1 g tablet Take 1 tablet (1 g total) by mouth every 6 (six) hours as needed. 30 tablet 3   Current Facility-Administered Medications  Medication Dose Route Frequency Provider Last Rate Last Admin   ARIPiprazole ER (ABILIFY MAINTENA) 400 MG prefilled syringe 400 mg  400 mg Intramuscular Q28 days Toy Cookey E, NP   400 mg at 05/13/21 1018     Musculoskeletal: Strength & Muscle Tone: within normal limits Gait & Station: normal Patient leans: N/A  Psychiatric Specialty Exam: Review of Systems  There were no vitals taken for this visit.There is no height or weight on file to calculate BMI.  General Appearance: Casual and Fairly Groomed  Eye Contact:  Good  Speech:  Clear and Coherent and Normal Rate  Volume:  Normal  Mood:  Euthymic  Affect:  Appropriate and Congruent  Thought Process:  Coherent, Goal Directed, and Linear  Orientation:   Full (Time, Place, and Person)  Thought Content: WDL and Logical   Suicidal Thoughts:  No  Homicidal Thoughts:  No  Memory:  Immediate;   Good Recent;   Good Remote;   Good  Judgement:  Good  Insight:  Good  Psychomotor Activity:  Normal  Concentration:  Concentration: Good and Attention Span: Good  Recall:  Good  Fund of Knowledge: Good  Language: Good  Akathisia:  No  Handed:  Right  AIMS (if indicated): not done  Assets:  Communication Skills Desire for Improvement Leisure Time Physical Health Resilience Social Support  ADL's:  Intact  Cognition: WNL  Sleep:  Good   Screenings: GAD-7    Flowsheet Row Office Visit from 05/13/2021 in [facility] Video Visit from 03/13/2021 in [facility] Video Visit from 12/18/2020 in [facility] Office Visit from 10/28/2020 in [facility] Clinical Support from 09[facility]16/2020 in [facility]  Total GAD-7 Score 16 17 15  0 21      PHQ2-9    Flowsheet Row Office Visit from 05/13/2021 in [facility] Video Visit from 03/13/2021 in [facility] Video Visit from 12/18/2020 in [facility] Office Visit from 10/28/2020 in [facility] Clinical Support from 09/16/2020 in [facility]  PHQ-2 Total Score 4 1 5 3 6   PHQ-9 Total Score 14 10 18 7 26       Flowsheet Row Office Visit from 05/13/2021 in [facility] Video Visit from 03/13/2021 in [facility] Video Visit from 12/18/2020 in [facility]  C-SSRS RISK CATEGORY High Risk No Risk No Risk        Assessment and Plan: Patient received Abilify Maintena 400 mg IM. Plan to follow-up for scheduled Abilify [facility]I in one month. Laboratory studies  ordered for collection at next visit to include CMP, CBC, Prolactin and Hepatic panel.   [facility], FNP 05/13/2021, 10:33 AM

## 2021-05-14 ENCOUNTER — Ambulatory Visit: Payer: Self-pay | Admitting: Gastroenterology

## 2021-05-19 ENCOUNTER — Other Ambulatory Visit: Payer: Self-pay

## 2021-05-21 ENCOUNTER — Other Ambulatory Visit: Payer: Self-pay

## 2021-05-22 NOTE — Telephone Encounter (Signed)
From: Vallarie Mare  Sent: 05/21/2021   4:07 PM EDT  To: Frances Furbish, CMA, *  Subject: No show                                         I wanted to update you on this patient's EEG appointment.  He was scheduled for Monday  05/19/2021, he no-showed. I called him, he said he forgot. I offered a later appointment the same morning he was unable to take that slot. We rescheduled for today and he no-showed again.  I have asked the front desk to not reschedule him if he calls to reschedule.  Darl Pikes

## 2021-05-22 NOTE — Telephone Encounter (Signed)
Windell Norfolk, MD  Nadine Counts, CMA; Loralie Champagne D  Thank you for the update.   Windell Norfolk, MD

## 2021-06-10 ENCOUNTER — Ambulatory Visit (HOSPITAL_COMMUNITY): Payer: No Payment, Other

## 2021-06-11 ENCOUNTER — Telehealth (HOSPITAL_COMMUNITY): Payer: No Payment, Other | Admitting: Psychiatry

## 2021-06-23 ENCOUNTER — Other Ambulatory Visit: Payer: Self-pay

## 2021-06-24 ENCOUNTER — Other Ambulatory Visit (HOSPITAL_COMMUNITY): Payer: Self-pay

## 2021-06-27 ENCOUNTER — Ambulatory Visit: Payer: Self-pay | Admitting: Gastroenterology

## 2021-07-24 ENCOUNTER — Encounter: Payer: Self-pay | Admitting: Neurology

## 2021-07-24 ENCOUNTER — Ambulatory Visit: Payer: Self-pay | Admitting: Neurology

## 2022-01-18 ENCOUNTER — Emergency Department (HOSPITAL_COMMUNITY)
Admission: EM | Admit: 2022-01-18 | Discharge: 2022-01-18 | Disposition: A | Discharge status: Home / Self Care | Payer: 59 | Attending: Emergency Medicine | Admitting: Emergency Medicine

## 2022-01-18 ENCOUNTER — Emergency Department (HOSPITAL_COMMUNITY): Payer: 59

## 2022-01-18 DIAGNOSIS — S6991XA Unspecified injury of right wrist, hand and finger(s), initial encounter: Secondary | ICD-10-CM | POA: Diagnosis present

## 2022-01-18 DIAGNOSIS — S61247A Puncture wound with foreign body of left little finger without damage to nail, initial encounter: Secondary | ICD-10-CM | POA: Diagnosis not present

## 2022-01-18 DIAGNOSIS — R569 Unspecified convulsions: Secondary | ICD-10-CM | POA: Diagnosis not present

## 2022-01-18 DIAGNOSIS — S00219A Abrasion of unspecified eyelid and periocular area, initial encounter: Secondary | ICD-10-CM | POA: Insufficient documentation

## 2022-01-18 DIAGNOSIS — Y93I9 Activity, other involving external motion: Secondary | ICD-10-CM | POA: Insufficient documentation

## 2022-01-18 DIAGNOSIS — S61531A Puncture wound without foreign body of right wrist, initial encounter: Secondary | ICD-10-CM

## 2022-01-18 DIAGNOSIS — W3400XA Accidental discharge from unspecified firearms or gun, initial encounter: Secondary | ICD-10-CM | POA: Insufficient documentation

## 2022-01-18 DIAGNOSIS — S21232A Puncture wound without foreign body of left back wall of thorax without penetration into thoracic cavity, initial encounter: Secondary | ICD-10-CM | POA: Diagnosis not present

## 2022-01-18 LAB — CBC WITH DIFFERENTIAL/PLATELET
Abs Immature Granulocytes: 0.02 10*3/uL (ref 0.00–0.07)
Basophils Absolute: 0 10*3/uL (ref 0.0–0.1)
Basophils Relative: 1 %
Eosinophils Absolute: 0.1 10*3/uL (ref 0.0–0.5)
Eosinophils Relative: 1 %
HCT: 43.5 % (ref 39.0–52.0)
Hemoglobin: 14.5 g/dL (ref 13.0–17.0)
Immature Granulocytes: 0 %
Lymphocytes Relative: 26 %
Lymphs Abs: 1.9 10*3/uL (ref 0.7–4.0)
MCH: 31.3 pg (ref 26.0–34.0)
MCHC: 33.3 g/dL (ref 30.0–36.0)
MCV: 93.8 fL (ref 80.0–100.0)
Monocytes Absolute: 0.4 10*3/uL (ref 0.1–1.0)
Monocytes Relative: 6 %
Neutro Abs: 4.9 10*3/uL (ref 1.7–7.7)
Neutrophils Relative %: 66 %
Platelets: 305 10*3/uL (ref 150–400)
RBC: 4.64 MIL/uL (ref 4.22–5.81)
RDW: 13.4 % (ref 11.5–15.5)
WBC: 7.4 10*3/uL (ref 4.0–10.5)
nRBC: 0 % (ref 0.0–0.2)

## 2022-01-18 LAB — COMPREHENSIVE METABOLIC PANEL
ALT: 15 U/L (ref 0–44)
AST: 15 U/L (ref 15–41)
Albumin: 3.7 g/dL (ref 3.5–5.0)
Alkaline Phosphatase: 62 U/L (ref 38–126)
Anion gap: 4 — ABNORMAL LOW (ref 5–15)
BUN: 9 mg/dL (ref 6–20)
CO2: 29 mmol/L (ref 22–32)
Calcium: 8.9 mg/dL (ref 8.9–10.3)
Chloride: 109 mmol/L (ref 98–111)
Creatinine, Ser: 1.04 mg/dL (ref 0.61–1.24)
GFR, Estimated: >60 mL/min (ref >60)
Glucose, Bld: 89 mg/dL (ref 70–99)
Potassium: 3 mmol/L — ABNORMAL LOW (ref 3.5–5.1)
Sodium: 142 mmol/L (ref 135–145)
Total Bilirubin: 0.6 mg/dL (ref 0.3–1.2)
Total Protein: 7.3 g/dL (ref 6.5–8.1)

## 2022-01-18 LAB — ETHANOL: Alcohol, Ethyl (B): <10 mg/dL (ref <10)

## 2022-01-18 MED ORDER — KETOROLAC TROMETHAMINE 15 MG/ML IJ SOLN
15.0000 mg | Freq: Once | INTRAMUSCULAR | Status: AC
  Administered 2022-01-18: 15 mg via INTRAVENOUS
  Filled 2022-01-18: qty 1

## 2022-01-18 MED ORDER — LEVETIRACETAM IN NACL 1000 MG/100ML IV SOLN
1000.0000 mg | Freq: Once | INTRAVENOUS | Status: AC
  Administered 2022-01-18: 1000 mg via INTRAVENOUS
  Filled 2022-01-18: qty 100

## 2022-01-18 MED ORDER — FENTANYL CITRATE PF 50 MCG/ML IJ SOSY
25.0000 ug | PREFILLED_SYRINGE | Freq: Once | INTRAMUSCULAR | Status: AC
  Administered 2022-01-18: 25 ug via INTRAVENOUS
  Filled 2022-01-18: qty 1

## 2022-01-18 MED ORDER — OXYCODONE-ACETAMINOPHEN 5-325 MG PO TABS
1.0000 | ORAL_TABLET | Freq: Three times a day (TID) | ORAL | 0 refills | Status: AC | PRN
  Filled 2022-01-18: qty 6, 1d supply, fill #0
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
## 2022-01-19 ENCOUNTER — Other Ambulatory Visit: Payer: Self-pay

## 2022-01-19 ENCOUNTER — Ambulatory Visit: Payer: Self-pay

## 2022-01-19 LAB — LEVETIRACETAM LEVEL: Levetiracetam Lvl: <2 ug/mL — ABNORMAL LOW (ref 10.0–40.0)

## 2022-01-19 NOTE — Telephone Encounter (Signed)
?  Chief Complaint: Medication cost ?Symptoms:  ?Frequency:  ?Pertinent Negatives: Patient denies  ?Disposition: [] ED /[] Urgent Care (no appt availability in office) / [] Appointment(In office/virtual)/ []  [facility]/ [] Home Care/ [] Refused Recommended Disposition /[] [facility]/ [x]  Follow-up with PCP ?Additional Notes: Pt needs a call back from pharmacy regarding the cost for this medication.  ? ? ?Reason for Disposition ? [1] Prescription refill request for ESSENTIAL medicine (i.e., likelihood of harm to patient if not taken) AND [2] triager unable to refill per department policy ? ?Answer Assessment - Initial Assessment Questions ?1. DRUG NAME: "What medicine do you need to have refilled?" ?    oxyCODONE-acetaminophen (PERCOCET/ROXICET) 5-325 MG tablet  ?2. REFILLS REMAINING: "How many refills are remaining?" (Note: The label on the medicine or pill bottle will show how many refills are remaining. If there are no refills remaining, then a renewal may be needed.) ?    0 ?3. EXPIRATION DATE: "What is the expiration date?" (Note: The label states when the prescription will expire, and thus can no longer be refilled.) ?    na ?4. PRESCRIBING HCP: "Who prescribed it?" Reason: If prescribed by specialist, call should be referred to that group. ?     ?5. SYMPTOMS: "Do you have any symptoms?" ?    pain ?6. PREGNANCY: "Is there any chance that you are pregnant?" "When was your last menstrual period?" ?    na ? ?Protocols used: Medication Refill and Renewal Call-A-AH ? ?

## 2022-01-23 ENCOUNTER — Other Ambulatory Visit: Payer: Self-pay

## 2022-07-07 ENCOUNTER — Emergency Department (HOSPITAL_COMMUNITY)
Admission: EM | Admit: 2022-07-07 | Discharge: 2022-07-08 | Disposition: A | Discharge status: Home / Self Care | Payer: 59 | Attending: Emergency Medicine | Admitting: Emergency Medicine

## 2022-07-07 ENCOUNTER — Encounter (HOSPITAL_COMMUNITY): Payer: Self-pay | Admitting: Emergency Medicine

## 2022-07-07 ENCOUNTER — Other Ambulatory Visit: Payer: Self-pay

## 2022-07-07 DIAGNOSIS — H6691 Otitis media, unspecified, right ear: Secondary | ICD-10-CM | POA: Insufficient documentation

## 2022-07-07 DIAGNOSIS — H6690 Otitis media, unspecified, unspecified ear: Secondary | ICD-10-CM

## 2022-07-07 DIAGNOSIS — R0981 Nasal congestion: Secondary | ICD-10-CM | POA: Insufficient documentation

## 2022-07-07 NOTE — ED Triage Notes (Signed)
Pt BIB [facility] c/o right ear pain x 2 days, reports chills yesterday but did not take temp
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
## 2022-07-08 ENCOUNTER — Encounter (HOSPITAL_COMMUNITY): Payer: Self-pay | Admitting: Student

## 2022-07-08 MED ORDER — NAPROXEN 500 MG PO TABS: 500.0000 mg | ORAL_TABLET | Freq: Two times a day (BID) | ORAL | 0 refills | Status: AC | PRN

## 2022-07-08 MED ORDER — AMOXICILLIN-POT CLAVULANATE 875-125 MG PO TABS
1.0000 | ORAL_TABLET | Freq: Once | ORAL | Status: AC
  Administered 2022-07-08: 1 via ORAL
  Filled 2022-07-08: qty 1

## 2022-07-08 MED ORDER — AMOXICILLIN-POT CLAVULANATE 875-125 MG PO TABS: 1.0000 | ORAL_TABLET | Freq: Two times a day (BID) | ORAL | 0 refills | Status: DC

## 2022-07-08 MED ORDER — NAPROXEN 500 MG PO TABS
500.0000 mg | ORAL_TABLET | Freq: Once | ORAL | Status: AC
  Administered 2022-07-08: 500 mg via ORAL
  Filled 2022-07-08: qty 1

## 2022-07-08 NOTE — Discharge Instructions (Addendum)
You were seen in the emergency department today and found to have an ear infection.  Please take Augmentin, an antibiotic, as prescribed to treat the infection and take naproxen as needed for pain.  Take naproxen with food as it can cause stomach upset and it were stomach bleeding.  Do not take other NSAIDs with naproxen such as ibuprofen, Motrin, Advil, Aleve, Goody powder, diclofenac, etc. as these are similar medicines.  You may take Tylenol per over-the-counter dosing as needed for pain as well.  We have prescribed you new medication(s) today. Discuss the medications prescribed today with your pharmacist as they can have adverse effects and interactions with your other medicines including over the counter and prescribed medications. Seek medical evaluation if you start to experience new or abnormal symptoms after taking one of these medicines, seek care immediately if you start to experience difficulty breathing, feeling of your throat closing, facial swelling, or rash as these could be indications of a more serious allergic reaction  Follow-up with primary care in 1 week.  Return to the emergency department for new or worsening pain, pain/swelling/redness behind your ear, neck stiffness, new or worsening symptoms, or any other concerns.

## 2022-07-08 NOTE — ED Notes (Signed)
Pt crying out in room asking for pain medication.

## 2022-07-08 NOTE — ED Notes (Signed)
Pt ambulatory without assistance.  

## 2022-07-08 NOTE — ED Provider Notes (Signed)
[facility] DEPT Provider Note   CSN: [id number] Arrival date & time: 07/07/22  2239     History  Chief Complaint  Patient presents with   Otalgia    Greg Morales is a 36 y.o. male with a history of bipolar disorder, medical noncompliance, polysubstance abuse, and seizures who presents to the emergency department with complaints of right ear pain for the past 2 days.  Patient reports the pain is constant, progressively worsening, no alleviating or aggravating factors.  He is having some associated decreased hearing as well as subjective fevers, chills, and nasal congestion.  No intervention prior to arrival.  He denies vomiting, abdominal pain, ear drainage, neck stiffness, or dyspnea.  HPI     Home Medications Prior to Admission medications   Medication Sig Start Date End Date Taking? Authorizing Provider  ARIPiprazole ER (ABILIFY MAINTENA) 400 MG PRSY prefilled syringe Inject 400 mg into the muscle every 28 (twenty-eight) days. 03/13/21   Salley Slaughter, NP  Caraway Oil-Levomenthol (FDGARD) 25-20.75 MG CAPS Take as directed as needed 02/13/21   Armbruster, Carlota Raspberry, MD  lamoTRIgine (LAMICTAL) 100 MG tablet Take 1 tablet (100 mg total) by mouth daily. Take 3 tablets by mouth in the evening with meals 05/24/18   Ladell Pier, MD  levETIRAcetam (KEPPRA) 500 MG tablet Take 2 tablets (1,000 mg total) by mouth 2 (two) times daily. 04/22/21   Alric Ran, MD  metoCLOPramide (REGLAN) 5 MG tablet Take 1 tablet (5 mg total) by mouth 3 (three) times daily before meals. 03/11/21   Yetta Flock, MD  mirtazapine (REMERON) 30 MG tablet TAKE 1 TABLET (30 MG TOTAL) BY MOUTH AT BEDTIME. 03/13/21 03/13/22  Eulis Canner E, NP  omeprazole (PRILOSEC) 40 MG capsule Take 1 capsule (40 mg total) by mouth daily. 12/04/20   Ladell Pier, MD  oxyCODONE-acetaminophen (PERCOCET/ROXICET) 5-325 MG tablet Take 1-2 tablets by mouth every 8 (eight) hours as  needed for severe pain. 01/18/22   Davonna Belling, MD  sucralfate (CARAFATE) 1 g tablet Take 1 tablet (1 g total) by mouth every 6 (six) hours as needed. 02/13/21   Armbruster, Carlota Raspberry, MD      Allergies    Patient has no known allergies.    Review of Systems   Review of Systems  Constitutional:  Positive for chills and fever.  HENT:  Positive for congestion and ear pain. Negative for sore throat.   Respiratory:  Negative for shortness of breath.   Cardiovascular:  Negative for chest pain.  Gastrointestinal:  Negative for abdominal pain and vomiting.  Neurological:  Negative for syncope.  All other systems reviewed and are negative.   Physical Exam Updated Vital Signs BP (!) 138/99 (BP Location: Left Arm)   Pulse 90   Temp 99.4 F (37.4 C) (Oral)   Resp 16   Ht 5\' 6"  (1.676 m)   Wt 62.6 kg   SpO2 100%   BMI 22.27 kg/m  Physical Exam Vitals and nursing note reviewed.  Constitutional:      General: He is not in acute distress.    Appearance: He is well-developed. He is not toxic-appearing.  HENT:     Head: Normocephalic and atraumatic.     Right Ear: Ear canal normal. Tympanic membrane is erythematous and bulging. Tympanic membrane is not perforated.     Left Ear: Ear canal normal. Tympanic membrane is not perforated, erythematous, retracted or bulging.     Ears:  Comments: No mastoid erythema/swellng/tenderness.     Nose: Congestion present.     Right Sinus: No maxillary sinus tenderness or frontal sinus tenderness.     Left Sinus: No maxillary sinus tenderness or frontal sinus tenderness.     Mouth/Throat:     Mouth: Mucous membranes are moist.     Pharynx: Oropharynx is clear. Uvula midline. No oropharyngeal exudate or posterior oropharyngeal erythema.     Comments: Posterior oropharynx is symmetric appearing. Patient tolerating own secretions without difficulty. No trismus. No drooling. No hot potato voice. No swelling beneath the tongue, submandibular  compartment is soft.  Eyes:     General:        Right eye: No discharge.        Left eye: No discharge.     Conjunctiva/sclera: Conjunctivae normal.  Cardiovascular:     Rate and Rhythm: Normal rate and regular rhythm.  Pulmonary:     Effort: Pulmonary effort is normal. No respiratory distress.     Breath sounds: Normal breath sounds. No wheezing, rhonchi or rales.  Abdominal:     General: There is no distension.     Palpations: Abdomen is soft.     Tenderness: There is no abdominal tenderness.  Musculoskeletal:     Cervical back: Neck supple. No rigidity.  Lymphadenopathy:     Cervical: No cervical adenopathy.  Skin:    General: Skin is warm and dry.     Findings: No rash.  Neurological:     Mental Status: He is alert.     ED Results / Procedures / Treatments   Labs (all labs ordered are listed, but only abnormal results are displayed) Labs Reviewed - No data to display  EKG None  Radiology No results found.  Procedures Procedures    Medications Ordered in ED Medications - No data to display  ED Course/ Medical Decision Making/ A&P                           Medical Decision Making Risk Prescription drug management.   Patient presents to the emergency department with complaints of right ear pain.  Nontoxic, blood pressure mildly elevated, doubt hypertensive emergency.  chart/nursing note reviewed for additional history Last creatinine WNL.   Exam with findings concerning for acute otitis media.  No perforation noted.  No findings of mastoiditis or meningismus on exam.  Will treat with Augmentin and analgesics.  PCP follow-up.  I discussed treatment plan, need for follow-up, and return precautions with the patient. Provided opportunity for questions, patient confirmed understanding and is in agreement with plan.   Final Clinical Impression(s) / ED Diagnoses Final diagnoses:  Acute otitis media, unspecified otitis media type    Rx / DC Orders ED  Discharge Orders          Ordered    amoxicillin-clavulanate (AUGMENTIN) 875-125 MG tablet  Every 12 hours        07/08/22 0146    naproxen (NAPROSYN) 500 MG tablet  2 times daily PRN        07/08/22 0146              Latoshia Monrroy, Pleas Koch, PA-C 07/08/22 6270    Shon Baton, MD 07/08/22 [phone]

## 2022-07-19 ENCOUNTER — Encounter: Payer: Self-pay | Admitting: Emergency Medicine

## 2022-07-19 ENCOUNTER — Emergency Department
Admission: EM | Admit: 2022-07-19 | Discharge: 2022-07-20 | Attending: Emergency Medicine | Admitting: Emergency Medicine

## 2022-07-19 DIAGNOSIS — R569 Unspecified convulsions: Secondary | ICD-10-CM

## 2022-07-19 DIAGNOSIS — G40909 Epilepsy, unspecified, not intractable, without status epilepticus: Secondary | ICD-10-CM | POA: Insufficient documentation

## 2022-07-19 DIAGNOSIS — H60531 Acute contact otitis externa, right ear: Secondary | ICD-10-CM | POA: Diagnosis not present

## 2022-07-19 DIAGNOSIS — H60501 Unspecified acute noninfective otitis externa, right ear: Secondary | ICD-10-CM

## 2022-07-19 MED ORDER — OFLOXACIN 0.3 % OP SOLN: OPHTHALMIC | 0 refills | Status: DC

## 2022-07-19 MED ORDER — LEVETIRACETAM IN NACL 1000 MG/100ML IV SOLN
1000.0000 mg | Freq: Once | INTRAVENOUS | Status: AC
  Administered 2022-07-19: 1000 mg via INTRAVENOUS
  Filled 2022-07-19: qty 100

## 2022-07-19 NOTE — ED Provider Notes (Signed)
[facility] Provider Note    Event Date/Time   First MD Initiated Contact with Patient 07/19/22 2325     (approximate)  History   Chief Complaint: Seizures  HPI  Greg Morales is a 36 y.o. male with a past medical history of bipolar, polysubstance abuse, seizure disorder, presents emergency department after witnessed tonic-clonic seizure while at jail tonight.  According to report patient had a witnessed tonic-clonic seizure at jail and they brought the patient to the emergency department.  EMS states upon their arrival patient was postictal however he is now awake alert and oriented.  Patient denies any complaints besides right ear pain which she states has been ongoing for the past 1 week.  Patient also states a seizure disorder for which she takes Keppra.  According to the police officer patient has been in jail since 07/14/2022 in the jail is dosing his medications and he does not believe there is a possibility that a dose was missed.  Patient states his last seizure was approximately 1 week ago or so.  Physical Exam   Triage Vital Signs: ED Triage Vitals [07/19/22 2329]  Enc Vitals Group     BP      Pulse      Resp      Temp      Temp src      SpO2      Weight 138 lb 14.2 oz (63 kg)     Height 5\' 6"  (1.676 m)     Head Circumference      Peak Flow      Pain Score      Pain Loc      Pain Edu?      Excl. in [place]?     Most recent vital signs: There were no vitals filed for this visit.  General: Awake, no distress.  CV:  Good peripheral perfusion.  Regular rate and rhythm  Resp:  Normal effort.  Equal breath sounds bilaterally.  Abd:  No distention.  Soft, nontender.  No rebound or guarding. Other:  Patient has mild erythema and mild swelling of the right external auditory canal most consistent with otitis externa.  Normal tympanic membrane.   ED Results / Procedures / Treatments   MEDICATIONS ORDERED IN ED: Medications  levETIRAcetam  (KEPPRA) IVPB 1000 mg/100 mL premix (has no administration in time range)     IMPRESSION / MDM / ASSESSMENT AND PLAN / ED COURSE  I reviewed the triage vital signs and the nursing notes.  Patient's presentation is most consistent with acute presentation with potential threat to life or bodily function.  Patient presents emergency department for a witnessed seizure while at jail tonight.  Patient has a known seizure disorder takes Keppra.  We will load with IV Keppra in the emergency department.  We will check labs and continue to closely monitor.  Overall the patient appears well, no distress.  Does have mild swelling of the right external auditory canal most consistent with otitis externa we will prescribe ofloxacin drops upon discharge.  We will continue to closely monitor.  No signs of head trauma on my evaluation.  Patient's CBC is reassuring, chemistry reassuring.  Patient is awake alert oriented, no distress no complaints.  Patient has eaten.  We will discharge back to the jail.  Patient agreeable to plan.  FINAL CLINICAL IMPRESSION(S) / ED DIAGNOSES   Seizure  Rx / DC Orders   Ofloxacin eardrop  Note:  This document was  prepared using Conservation officer, historic buildings and may include unintentional dictation errors.   Minna Antis, MD 07/20/22 [phone]

## 2022-07-19 NOTE — ED Triage Notes (Signed)
Pt arrived via [facility] from local jail where pt was observed lying on his mat in jail cell having seizure like activity. Pt reports he takes Keppra but has not received medication today. Pt also c/o right ear pain since 10/8 and was seen for it in ED on 10/10. Pt A&O x4 on arrival to ED.

## 2022-07-20 LAB — COMPREHENSIVE METABOLIC PANEL
ALT: 15 U/L (ref 0–44)
AST: 23 U/L (ref 15–41)
Albumin: 3.2 g/dL — ABNORMAL LOW (ref 3.5–5.0)
Alkaline Phosphatase: 51 U/L (ref 38–126)
Anion gap: 8 (ref 5–15)
BUN: 15 mg/dL (ref 6–20)
CO2: 26 mmol/L (ref 22–32)
Calcium: 8.9 mg/dL (ref 8.9–10.3)
Chloride: 104 mmol/L (ref 98–111)
Creatinine, Ser: 1.23 mg/dL (ref 0.61–1.24)
GFR, Estimated: >60 mL/min (ref >60)
Glucose, Bld: 109 mg/dL — ABNORMAL HIGH (ref 70–99)
Potassium: 3.5 mmol/L (ref 3.5–5.1)
Sodium: 138 mmol/L (ref 135–145)
Total Bilirubin: 0.5 mg/dL (ref 0.3–1.2)
Total Protein: 7.3 g/dL (ref 6.5–8.1)

## 2022-07-20 LAB — CBC
HCT: 41.6 % (ref 39.0–52.0)
Hemoglobin: 13.4 g/dL (ref 13.0–17.0)
MCH: 30.2 pg (ref 26.0–34.0)
MCHC: 32.2 g/dL (ref 30.0–36.0)
MCV: 93.9 fL (ref 80.0–100.0)
Platelets: 391 10*3/uL (ref 150–400)
RBC: 4.43 MIL/uL (ref 4.22–5.81)
RDW: 13.6 % (ref 11.5–15.5)
WBC: 11 10*3/uL — ABNORMAL HIGH (ref 4.0–10.5)
nRBC: 0 % (ref 0.0–0.2)

## 2022-07-20 MED ORDER — ACETAMINOPHEN 500 MG PO TABS
1000.0000 mg | ORAL_TABLET | Freq: Once | ORAL | Status: AC
  Administered 2022-07-20: 1000 mg via ORAL
  Filled 2022-07-20: qty 2

## 2022-07-20 NOTE — ED Notes (Signed)
Seizure pads applied to bed.  

## 2022-08-31 ENCOUNTER — Other Ambulatory Visit: Payer: Self-pay

## 2022-08-31 ENCOUNTER — Emergency Department (HOSPITAL_COMMUNITY)
Admission: EM | Admit: 2022-08-31 | Discharge: 2022-09-01 | Discharge status: Against Medical Advice | Attending: Emergency Medicine | Admitting: Emergency Medicine

## 2022-08-31 DIAGNOSIS — R109 Unspecified abdominal pain: Secondary | ICD-10-CM | POA: Diagnosis present

## 2022-08-31 DIAGNOSIS — Z5321 Procedure and treatment not carried out due to patient leaving prior to being seen by health care provider: Secondary | ICD-10-CM | POA: Diagnosis not present

## 2022-08-31 DIAGNOSIS — R1110 Vomiting, unspecified: Secondary | ICD-10-CM | POA: Insufficient documentation

## 2022-08-31 NOTE — ED Triage Notes (Signed)
Pt BIB [facility] with reports of abdominal pain and "not getting my mental health shot." Pt screaming and rolling in wheelchair in triage.

## 2022-08-31 NOTE — ED Notes (Addendum)
Patient refused lab draw. Charge RN aware.

## 2022-08-31 NOTE — ED Provider Triage Note (Signed)
Emergency Medicine Provider Triage Evaluation Note  GEARALD STONEBRAKER , a 36 y.o. male  was evaluated in triage.  Pt complains of abdominal pain.  Started this morning.  Located left abdomen.  He has never had a thing like this previously.  No urinary symptoms.  Episodes of NBNB emesis.  No flank pain, hematuria.  No history of stones.  No chest pain or shortness of breath.  Review of Systems  Positive: Abd pain, emesis Negative: Flank pain, back pain, urinary sx, CP, SOB  Physical Exam  BP (!) 144/98 (BP Location: Left Arm)   Pulse 81   Temp 98.2 F (36.8 C) (Oral)   Resp 20   SpO2 99%  Gen:   Awake,  Resp:  Normal effort  ABD:  Diffuse left abdominal pain however difficult exam due to pt compliants MSK:   Moves extremities without difficulty  Other:    Medical Decision Making  Medically screening exam initiated at 3:08 PM.  Appropriate orders placed.  [name] was informed that the remainder of the evaluation will be completed by another provider, this initial triage assessment does not replace that evaluation, and the importance of remaining in the ED until their evaluation is complete.  Abd pain, emesis   Lalita Ebel A, PA-C 08/31/22 1510

## 2022-09-16 ENCOUNTER — Emergency Department (HOSPITAL_COMMUNITY)

## 2022-09-16 ENCOUNTER — Other Ambulatory Visit: Payer: Self-pay

## 2022-09-16 ENCOUNTER — Encounter (HOSPITAL_COMMUNITY): Payer: Self-pay

## 2022-09-16 ENCOUNTER — Inpatient Hospital Stay (HOSPITAL_COMMUNITY)
Admission: EM | Admit: 2022-09-16 | Discharge: 2022-09-18 | DRG: 100 | Attending: Internal Medicine | Admitting: Internal Medicine

## 2022-09-16 DIAGNOSIS — R079 Chest pain, unspecified: Secondary | ICD-10-CM | POA: Diagnosis present

## 2022-09-16 DIAGNOSIS — Z5902 Unsheltered homelessness: Secondary | ICD-10-CM

## 2022-09-16 DIAGNOSIS — R109 Unspecified abdominal pain: Secondary | ICD-10-CM | POA: Diagnosis present

## 2022-09-16 DIAGNOSIS — G9340 Encephalopathy, unspecified: Secondary | ICD-10-CM | POA: Diagnosis not present

## 2022-09-16 DIAGNOSIS — Z8659 Personal history of other mental and behavioral disorders: Secondary | ICD-10-CM

## 2022-09-16 DIAGNOSIS — Z79899 Other long term (current) drug therapy: Secondary | ICD-10-CM

## 2022-09-16 DIAGNOSIS — R569 Unspecified convulsions: Secondary | ICD-10-CM

## 2022-09-16 DIAGNOSIS — G9341 Metabolic encephalopathy: Secondary | ICD-10-CM | POA: Diagnosis present

## 2022-09-16 DIAGNOSIS — F1914 Other psychoactive substance abuse with psychoactive substance-induced mood disorder: Secondary | ICD-10-CM | POA: Diagnosis present

## 2022-09-16 DIAGNOSIS — Z91148 Patient's other noncompliance with medication regimen for other reason: Secondary | ICD-10-CM

## 2022-09-16 DIAGNOSIS — K219 Gastro-esophageal reflux disease without esophagitis: Secondary | ICD-10-CM | POA: Diagnosis present

## 2022-09-16 DIAGNOSIS — R4182 Altered mental status, unspecified: Principal | ICD-10-CM

## 2022-09-16 DIAGNOSIS — Z9151 Personal history of suicidal behavior: Secondary | ICD-10-CM

## 2022-09-16 DIAGNOSIS — T40602A Poisoning by unspecified narcotics, intentional self-harm, initial encounter: Secondary | ICD-10-CM | POA: Diagnosis not present

## 2022-09-16 DIAGNOSIS — G40409 Other generalized epilepsy and epileptic syndromes, not intractable, without status epilepticus: Secondary | ICD-10-CM | POA: Diagnosis present

## 2022-09-16 DIAGNOSIS — G40909 Epilepsy, unspecified, not intractable, without status epilepticus: Secondary | ICD-10-CM | POA: Diagnosis not present

## 2022-09-16 DIAGNOSIS — R45851 Suicidal ideations: Secondary | ICD-10-CM | POA: Diagnosis present

## 2022-09-16 DIAGNOSIS — F1990 Other psychoactive substance use, unspecified, uncomplicated: Secondary | ICD-10-CM | POA: Insufficient documentation

## 2022-09-16 DIAGNOSIS — R197 Diarrhea, unspecified: Secondary | ICD-10-CM

## 2022-09-16 DIAGNOSIS — R112 Nausea with vomiting, unspecified: Secondary | ICD-10-CM

## 2022-09-16 DIAGNOSIS — R1030 Lower abdominal pain, unspecified: Secondary | ICD-10-CM | POA: Diagnosis not present

## 2022-09-16 DIAGNOSIS — G8929 Other chronic pain: Secondary | ICD-10-CM | POA: Diagnosis present

## 2022-09-16 DIAGNOSIS — F17210 Nicotine dependence, cigarettes, uncomplicated: Secondary | ICD-10-CM | POA: Diagnosis present

## 2022-09-16 DIAGNOSIS — M7918 Myalgia, other site: Secondary | ICD-10-CM

## 2022-09-16 DIAGNOSIS — F319 Bipolar disorder, unspecified: Secondary | ICD-10-CM | POA: Diagnosis present

## 2022-09-16 DIAGNOSIS — Z8249 Family history of ischemic heart disease and other diseases of the circulatory system: Secondary | ICD-10-CM

## 2022-09-16 DIAGNOSIS — F333 Major depressive disorder, recurrent, severe with psychotic symptoms: Secondary | ICD-10-CM

## 2022-09-16 LAB — CBC WITH DIFFERENTIAL/PLATELET
Abs Immature Granulocytes: 0.02 10*3/uL (ref 0.00–0.07)
Basophils Absolute: 0 10*3/uL (ref 0.0–0.1)
Basophils Relative: 1 %
Eosinophils Absolute: 0.1 10*3/uL (ref 0.0–0.5)
Eosinophils Relative: 2 %
HCT: 42.8 % (ref 39.0–52.0)
Hemoglobin: 14 g/dL (ref 13.0–17.0)
Immature Granulocytes: 0 %
Lymphocytes Relative: 18 %
Lymphs Abs: 1.1 10*3/uL (ref 0.7–4.0)
MCH: 31.4 pg (ref 26.0–34.0)
MCHC: 32.7 g/dL (ref 30.0–36.0)
MCV: 96 fL (ref 80.0–100.0)
Monocytes Absolute: 0.3 10*3/uL (ref 0.1–1.0)
Monocytes Relative: 5 %
Neutro Abs: 4.6 10*3/uL (ref 1.7–7.7)
Neutrophils Relative %: 74 %
Platelets: 348 10*3/uL (ref 150–400)
RBC: 4.46 MIL/uL (ref 4.22–5.81)
RDW: 14.4 % (ref 11.5–15.5)
WBC: 6.2 10*3/uL (ref 4.0–10.5)
nRBC: 0 % (ref 0.0–0.2)

## 2022-09-16 LAB — HEPATIC FUNCTION PANEL
ALT: 12 U/L (ref 0–44)
AST: 23 U/L (ref 15–41)
Albumin: 3.1 g/dL — ABNORMAL LOW (ref 3.5–5.0)
Alkaline Phosphatase: 47 U/L (ref 38–126)
Bilirubin, Direct: 0.3 mg/dL — ABNORMAL HIGH (ref 0.0–0.2)
Indirect Bilirubin: 0.5 mg/dL (ref 0.3–0.9)
Total Bilirubin: 0.8 mg/dL (ref 0.3–1.2)
Total Protein: 6.3 g/dL — ABNORMAL LOW (ref 6.5–8.1)

## 2022-09-16 LAB — I-STAT CHEM 8, ED
BUN: 5 mg/dL — ABNORMAL LOW (ref 6–20)
Calcium, Ion: 1.02 mmol/L — ABNORMAL LOW (ref 1.15–1.40)
Chloride: 102 mmol/L (ref 98–111)
Creatinine, Ser: 0.8 mg/dL (ref 0.61–1.24)
Glucose, Bld: 110 mg/dL — ABNORMAL HIGH (ref 70–99)
HCT: 44 % (ref 39.0–52.0)
Hemoglobin: 15 g/dL (ref 13.0–17.0)
Potassium: 3.9 mmol/L (ref 3.5–5.1)
Sodium: 139 mmol/L (ref 135–145)
TCO2: 26 mmol/L (ref 22–32)

## 2022-09-16 LAB — RAPID URINE DRUG SCREEN, HOSP PERFORMED
Amphetamines: NOT DETECTED
Barbiturates: NOT DETECTED
Benzodiazepines: NOT DETECTED
Cocaine: POSITIVE — AB
Opiates: NOT DETECTED
Tetrahydrocannabinol: POSITIVE — AB

## 2022-09-16 LAB — LACTIC ACID, PLASMA: Lactic Acid, Venous: 1.8 mmol/L (ref 0.5–1.9)

## 2022-09-16 LAB — URINALYSIS, ROUTINE W REFLEX MICROSCOPIC
Bilirubin Urine: NEGATIVE
Glucose, UA: NEGATIVE mg/dL
Hgb urine dipstick: NEGATIVE
Ketones, ur: NEGATIVE mg/dL
Leukocytes,Ua: NEGATIVE
Nitrite: NEGATIVE
Protein, ur: NEGATIVE mg/dL
Specific Gravity, Urine: 1.015 (ref 1.005–1.030)
pH: 6 (ref 5.0–8.0)

## 2022-09-16 LAB — AMMONIA: Ammonia: 67 umol/L — ABNORMAL HIGH (ref 9–35)

## 2022-09-16 LAB — ACETAMINOPHEN LEVEL: Acetaminophen (Tylenol), Serum: <10 ug/mL — ABNORMAL LOW (ref 10–30)

## 2022-09-16 LAB — CK: Total CK: 155 U/L (ref 49–397)

## 2022-09-16 LAB — ETHANOL: Alcohol, Ethyl (B): <10 mg/dL (ref <10)

## 2022-09-16 LAB — LIPASE, BLOOD: Lipase: 34 U/L (ref 11–51)

## 2022-09-16 LAB — SALICYLATE LEVEL: Salicylate Lvl: <7 mg/dL — ABNORMAL LOW (ref 7.0–30.0)

## 2022-09-16 MED ORDER — LAMOTRIGINE 150 MG PO TABS: 300.0000 mg | ORAL_TABLET | Freq: Every day | ORAL | Status: DC

## 2022-09-16 MED ORDER — HYDROMORPHONE HCL 1 MG/ML IJ SOLN
1.0000 mg | INTRAMUSCULAR | Status: DC | PRN
  Administered 2022-09-17: 1 mg via INTRAVENOUS
  Filled 2022-09-16: qty 1

## 2022-09-16 MED ORDER — SODIUM CHLORIDE 0.9 % IV BOLUS
1000.0000 mL | Freq: Once | INTRAVENOUS | Status: AC
  Administered 2022-09-16: 1000 mL via INTRAVENOUS

## 2022-09-16 MED ORDER — LEVETIRACETAM IN NACL 1000 MG/100ML IV SOLN
1000.0000 mg | Freq: Once | INTRAVENOUS | Status: AC
  Administered 2022-09-16: 1000 mg via INTRAVENOUS
  Filled 2022-09-16: qty 100

## 2022-09-16 MED ORDER — ACETAMINOPHEN 650 MG RE SUPP: 650.0000 mg | Freq: Four times a day (QID) | RECTAL | Status: DC | PRN

## 2022-09-16 MED ORDER — LACTATED RINGERS IV SOLN: INTRAVENOUS | Status: AC

## 2022-09-16 MED ORDER — ACETAMINOPHEN 325 MG PO TABS: 650.0000 mg | ORAL_TABLET | Freq: Four times a day (QID) | ORAL | Status: DC | PRN

## 2022-09-16 MED ORDER — LORAZEPAM 2 MG/ML IJ SOLN: 2.0000 mg | INTRAMUSCULAR | Status: DC | PRN

## 2022-09-16 MED ORDER — ONDANSETRON HCL 4 MG/2ML IJ SOLN
4.0000 mg | Freq: Four times a day (QID) | INTRAMUSCULAR | Status: DC | PRN
  Administered 2022-09-16 – 2022-09-17 (×2): 4 mg via INTRAVENOUS
  Filled 2022-09-16 (×2): qty 2

## 2022-09-16 MED ORDER — LAMOTRIGINE 25 MG PO TABS
150.0000 mg | ORAL_TABLET | Freq: Two times a day (BID) | ORAL | Status: DC
  Administered 2022-09-16 – 2022-09-18 (×4): 150 mg via ORAL
  Filled 2022-09-16 (×5): qty 2

## 2022-09-16 MED ORDER — LEVETIRACETAM IN NACL 1000 MG/100ML IV SOLN
1000.0000 mg | Freq: Two times a day (BID) | INTRAVENOUS | Status: DC
  Administered 2022-09-17: 1000 mg via INTRAVENOUS
  Filled 2022-09-16 (×4): qty 100

## 2022-09-16 MED ORDER — LAMOTRIGINE 25 MG PO TABS: 100.0000 mg | ORAL_TABLET | Freq: Every day | ORAL | Status: DC

## 2022-09-16 MED ORDER — LACTULOSE 10 GM/15ML PO SOLN
20.0000 g | Freq: Once | ORAL | Status: AC
  Administered 2022-09-16: 20 g via ORAL
  Filled 2022-09-16: qty 30

## 2022-09-16 MED ORDER — ENOXAPARIN SODIUM 40 MG/0.4ML IJ SOSY
40.0000 mg | PREFILLED_SYRINGE | INTRAMUSCULAR | Status: DC
  Administered 2022-09-16 – 2022-09-17 (×2): 40 mg via SUBCUTANEOUS
  Filled 2022-09-16 (×2): qty 0.4

## 2022-09-16 MED ORDER — LAMOTRIGINE 25 MG PO TABS
100.0000 mg | ORAL_TABLET | Freq: Once | ORAL | Status: AC
  Administered 2022-09-16: 100 mg via ORAL
  Filled 2022-09-16: qty 4

## 2022-09-16 NOTE — ED Provider Notes (Signed)
[facility] Provider Note   CSN: [id number] Arrival date & time: 09/16/22  1322     History  Chief Complaint  Patient presents with   Seizures   HPI Greg Morales is a 36 y.o. male with history of seizures and bipolar disorder presenting for seizures. Patient was being medically screened [facility] prison when he "slumped over and became unconscious, not breathing and pulseless". Gave cpr for 1 min and 8 mg of narcan and was thought to be post ictal. Per EMS patient combative and required sedation for an IV. Patient has known hx of polysubstance abuse. Also reported that he's been non compliant with his sz meds. During encounter, patient was somnolent and reluctant to answer questions. Patient became more somnolent. Sternal rubbed him and patient immediately yelled "get the fuck off me man". No witnessed urinary incontinence. States he has not had alcohol for at least a week.   Seizures      Home Medications Prior to Admission medications   Medication Sig Start Date End Date Taking? Authorizing Provider  amoxicillin-clavulanate (AUGMENTIN) 875-125 MG tablet Take 1 tablet by mouth every 12 (twelve) hours. 07/08/22   Petrucelli, Samantha R, PA-C  ARIPiprazole ER (ABILIFY MAINTENA) 400 MG PRSY prefilled syringe Inject 400 mg into the muscle every 28 (twenty-eight) days. 03/13/21   Salley Slaughter, NP  Caraway Oil-Levomenthol (FDGARD) 25-20.75 MG CAPS Take as directed as needed 02/13/21   Armbruster, Carlota Raspberry, MD  lamoTRIgine (LAMICTAL) 100 MG tablet Take 1 tablet (100 mg total) by mouth daily. Take 3 tablets by mouth in the evening with meals 05/24/18   Ladell Pier, MD  levETIRAcetam (KEPPRA) 500 MG tablet Take 2 tablets (1,000 mg total) by mouth 2 (two) times daily. 04/22/21   Alric Ran, MD  metoCLOPramide (REGLAN) 5 MG tablet Take 1 tablet (5 mg total) by mouth 3 (three) times daily before meals. 03/11/21   Yetta Flock, MD   mirtazapine (REMERON) 30 MG tablet TAKE 1 TABLET (30 MG TOTAL) BY MOUTH AT BEDTIME. 03/13/21 03/13/22  Eulis Canner E, NP  naproxen (NAPROSYN) 500 MG tablet Take 1 tablet (500 mg total) by mouth 2 (two) times daily as needed for moderate pain. 07/08/22   Petrucelli, Samantha R, PA-C  ofloxacin (OCUFLOX) 0.3 % ophthalmic solution Place 3 drops in right ear 3 times daily for the next 7 days. 07/19/22   Harvest Dark, MD  omeprazole (PRILOSEC) 40 MG capsule Take 1 capsule (40 mg total) by mouth daily. 12/04/20   Ladell Pier, MD  oxyCODONE-acetaminophen (PERCOCET/ROXICET) 5-325 MG tablet Take 1-2 tablets by mouth every 8 (eight) hours as needed for severe pain. 01/18/22   Davonna Belling, MD  sucralfate (CARAFATE) 1 g tablet Take 1 tablet (1 g total) by mouth every 6 (six) hours as needed. 02/13/21   Armbruster, Carlota Raspberry, MD      Allergies    Patient has no known allergies.    Review of Systems   Review of Systems  Neurological:  Positive for seizures.    Physical Exam   Vitals:   09/16/22 1730 09/16/22 1800  BP: 128/87 123/83  Pulse: 75 71  Resp: 16 15  Temp:    SpO2: 99% 100%    CONSTITUTIONAL: well-appearing, somnolent, disoriented NEURO: Alert and oriented x 3, CN 3-12 grossly intact.  EYES: eyes, equal, and reactive ENT/NECK:  Supple, no stridor, no tongue biting CARDIO: regular rate and rhythm, appears well-perfused  PULM:  No  respiratory distress, CTAB GI/GU:  non-distended, soft MSK/SPINE:  No gross deformities, no edema, moves all extremities  SKIN:  no rash, atraumatic  *Additional and/or pertinent findings included in MDM below     ED Results / Procedures / Treatments   Labs (all labs ordered are listed, but only abnormal results are displayed) Labs Reviewed  SALICYLATE LEVEL - Abnormal; Notable for the following components:      Result Value   Salicylate Lvl <7.0 (*)    All other components within normal limits  ACETAMINOPHEN LEVEL - Abnormal;  Notable for the following components:   Acetaminophen (Tylenol), Serum <10 (*)    All other components within normal limits  RAPID URINE DRUG SCREEN, HOSP PERFORMED - Abnormal; Notable for the following components:   Cocaine POSITIVE (*)    Tetrahydrocannabinol POSITIVE (*)    All other components within normal limits  AMMONIA - Abnormal; Notable for the following components:   Ammonia 67 (*)    All other components within normal limits  I-STAT CHEM 8, ED - Abnormal; Notable for the following components:   BUN 5 (*)    Glucose, Bld 110 (*)    Calcium, Ion 1.02 (*)    All other components within normal limits  ETHANOL  LACTIC ACID, PLASMA  CBC WITH DIFFERENTIAL/PLATELET  URINALYSIS, ROUTINE W REFLEX MICROSCOPIC  LEVETIRACETAM LEVEL    EKG EKG Interpretation  Date/Time:  Wednesday September 16 2022 13:43:44 EST Ventricular Rate:  72 PR Interval:  121 QRS Duration: 73 QT Interval:  387 QTC Calculation: 424 R Axis:   34 Text Interpretation: Sinus rhythm Low voltage, extremity leads Confirmed by Ernie Avena ([id number]) on 09/16/2022 2:18:52 PM  Radiology DG Chest Portable 1 View  Result Date: 09/16/2022 CLINICAL DATA:  Status post CPR EXAM: PORTABLE CHEST 1 VIEW COMPARISON:  CXR 01/18/22 FINDINGS: No pleural effusion. No pneumothorax. Normal cardiac and mediastinal contours. No displaced rib fractures. Focal airspace opacity. Visualized upper abdomen is unremarkable. IMPRESSION: No pleural effusion, pneumothorax, or focal airspace opacity. No displaced rib fracture. Electronically Signed   By: Lorenza Cambridge M.D.   On: 09/16/2022 14:51   CT HEAD WO CONTRAST  Result Date: 09/16/2022 CLINICAL DATA:  Trauma EXAM: CT HEAD WITHOUT CONTRAST TECHNIQUE: Contiguous axial images were obtained from the base of the skull through the vertex without intravenous contrast. RADIATION DOSE REDUCTION: This exam was performed according to the departmental dose-optimization program which includes  automated exposure control, adjustment of the mA and/or kV according to patient size and/or use of iterative reconstruction technique. COMPARISON:  01/18/2022 FINDINGS: Brain: No acute intracranial findings are seen. There are no signs of bleeding within the cranium. Minimal calcifications are seen in basal ganglia. Ventricles are not dilated. There is subtle decreased density in periventricular white matter. There is no focal edema or mass effect. Vascular: Unremarkable. Skull: No fracture is seen in calvarium. Sinuses/Orbits: Unremarkable. Other: None. IMPRESSION: No acute intracranial findings are seen in noncontrast CT brain. Electronically Signed   By: Ernie Avena M.D.   On: 09/16/2022 14:30    Procedures Procedures    Medications Ordered in ED Medications  lactulose (CHRONULAC) 10 GM/15ML solution 20 g (has no administration in time range)  sodium chloride 0.9 % bolus 1,000 mL (0 mLs Intravenous Stopped 09/16/22 1540)  levETIRAcetam (KEPPRA) IVPB 1000 mg/100 mL premix (0 mg Intravenous Stopped 09/16/22 1606)  lamoTRIgine (LAMICTAL) tablet 100 mg (100 mg Oral Given 09/16/22 1543)    ED Course/ Medical Decision Making/ A&P  Medical Decision Making Amount and/or Complexity of Data Reviewed Labs: ordered. Radiology: ordered.   Initial Impression and Ddx 36 yo male who is well appearing, non toxic and hemodynamically stable presenting for seizure and AMS. Physical exam unremarkable. Differential diagnosis for this complaint includes seizure, electrolyte derangement, hepatic encephalopathy, sepsis, drug intoxication and malingering. Patient PMH that increases complexity of ED encounter: Seizure disorder, bipolar disorder, polysubstance abuse, presently incarcerated  Interpretation of Diagnostics I independent reviewed and interpreted the labs as followed: Hyperammonemia, hyperglycemia  - I independently visualized the following imaging with scope of  interpretation limited to determining acute life threatening conditions related to emergency care: cxr showed no acute cardopulmonary process.  CT head showed no acute intracranial process  I independently reviewed and interpreted EKG which revealed normal sinus rhythm.  Patient Reassessment and Ultimate Disposition/Management Ordered patient's home seizure medication.  Treated hyperammonemia with lactulose.  Upon reevaluation patient was requesting coffee and food but also appeared tired.  Otherwise there was nothing focal on neuroexam and no evidence of active seizure during this encounter. Admitted to hospital service for observation and rule out hepatic encephalopathy versus drug intoxication.  Low suspicion for infection.   Patient presented to the ED in October of this year for tonic-clonic seizure was d/c from the ED.   Patient management required discussion with the following services or consulting groups:  Hospitalist Service  Complexity of Problems Addressed Acute complicated illness or Injury  Additional Data Reviewed and Analyzed Further history obtained from: Prior ED visit notes  Patient Encounter Risk Assessment Prescriptions and Consideration of hospitalization         Final Clinical Impression(s) / ED Diagnoses Final diagnoses:  Altered mental status, unspecified altered mental status type    Rx / DC Orders ED Discharge Orders     None         Harriet Pho, PA-C 09/16/22 1847    Regan Lemming, MD 09/16/22 2117

## 2022-09-16 NOTE — ED Notes (Signed)
Only got bare minimum for blood work due to IV not drawing well.  Will attempt other labs shortly

## 2022-09-16 NOTE — ED Notes (Signed)
Urinal at bedside.  

## 2022-09-16 NOTE — Hospital Course (Addendum)
Hx sz Poorly complianc [employer] Incarcertated Presented for med screening - refused uds Slumped over, became unconscious, not breathing, pulnesslss No urin incont, no tongue bite Got cpr and narcan Thought post ictal Ems became combat, req sed for iv Somnolent and reluc to answer q's , progressed, got sternal rubbed, swung and verbally abusive. Sepsis, sz, workup so far Ammonia elevated No sz activity Fluctuating lucicidty Malingering? - intermittently responds ? Elevated ammonia ? Hepatic enceph Wanting to admit for obs   -Woke up this AM, feel weak. Maybe prior than that -Was on drugs? The other day.  -Yes fever, vomit, abd pain, diarrhea, poor po intake. Poor sleep.  -Not sure if this feels like seizure.  -Don't know name or place.  -Meds? Take Lamictal and keppra but has not had a for long time -No alcohol -Yes opioid but no IV drugs. Unknown last use

## 2022-09-16 NOTE — ED Triage Notes (Signed)
Patient from jail after seizure. EMS reports patient was seizing and jail nurses thought they didn't feel a pulse and did 1 min of CPR and gave 6mg  intranasal narcan.  Patient slumped over in jail and passed out and now post ictal.  Patient was being taken to screening for suboxone.  Hx of polysubstance abuse. Been two days with any durgs use due to entering jail. Per EMS patient will fight you to do anything on him and he has be sedated for an IV.  Patient currently resting on stretcher in no distress.

## 2022-09-16 NOTE — ED Notes (Signed)
Upon entering the room, this nurse found pts iv out and running in the bed.

## 2022-09-16 NOTE — ED Notes (Signed)
Pt had 1 episode of vomiting.   

## 2022-09-16 NOTE — H&P (Cosign Needed)
Date: 09/16/2022               Patient Name:  Greg Morales MRN: [id number]  DOB: Nov 28, 1985 Age / Sex: 36 y.o., male   PCP: Marcine MatarJohnson, Deborah B, MD         Medical Service: Internal Medicine Teaching Service         Attending Physician: Dr. Mayford KnifeWilliams, Dorene ArJulie Anne, MD      First Contact: Dr. Willette Clusteryler Rogers, MD Pager [phone]    Second Contact: Dr. Rudene ChristiansKatie Masters, DO Pager [phone]         After Hours (After 5p/  First Contact Pager: [phone]  weekends / holidays): Second Contact Pager: [phone]   SUBJECTIVE   Chief Complaint: Syncope  History of Present Illness:  Greg Morales is a 36 y/o male with PMH of seizure disorder and bipolar disorder who presented to the ED after a syncopal episode this morning and received CPR for 1 minute and then responded to Narcan.  Following this episode he was found to be postictal due to being combative and he required sedation when EMS saw him.  During our visit he was reluctant to answer most questions but did tell us that he has been using opiates along with other drugs while in prison and has had some nausea, vomiting, and diarrhea but would not answer any other questions about that.  He reports not having his seizure medications recently but again does not specify this time period.  He denies any fevers, neck pain, neck rigidity.  He is also having some muscle pain which he states is primarily in his calfs and thighs but does not tell us how long it has been going on.  Denies any dysuria or hematuria.  ED Course: Patient arrived via EMS and was evaluated due to his history of seizures and possible altered mental status at the time.  He was judged to be well-appearing, hemodynamically stable and overall nontoxic.  His home seizure medications were reordered in the ED, he was given a bolus 1 L of normal saline and he was given lactulose for possible hepatic encephalopathy.  Meds:  Medication list reviewed from prior notes, patient confirmed  lamotrigine and levetiracetam but would not discuss other medications Aripiprazole 400 mg IM every 28 days Hydroxyzine 25 mg 3 times daily as needed Lamotrigine 100 mg in the morning and 300 mg in the evening Levetiracetam 1000 mg twice daily Mirtazapine 30 mg nightly Omeprazole 40 mg daily Metoclopramide 3 times daily with meals Sucralfate every 6 hours as needed  Past Medical History Seizure disorder Bipolar disorder Polysubstance use disorder GERD  Past Surgical History:  Procedure Laterality Date   BIOPSY  09/03/2020   Procedure: BIOPSY;  Surgeon: Beverley FiedlerPyrtle, Jay M, MD;  Location: [facility];  Service: Gastroenterology;;   ESOPHAGOGASTRODUODENOSCOPY     ESOPHAGOGASTRODUODENOSCOPY (EGD) WITH PROPOFOL N/A 09/03/2020   Procedure: ESOPHAGOGASTRODUODENOSCOPY (EGD) WITH PROPOFOL;  Surgeon: Beverley FiedlerPyrtle, Jay M, MD;  Location: [facility];  Service: Gastroenterology;  Laterality: N/A;    Social:  Currently incarcerated, unknown timeframe Level of Function: Independent PCP: Jonah Blueeborah Johnson, MD Substances: Admits to opiate use but denies IV drug use, admits to other drugs being currently uses well including marijuana and cocaine.  Denies alcohol use.  Review of prior records shows current everyday smoker with a 5-pack-year history in 03/2021  Family History:  Family History  Problem Relation Age of Onset   Other Mother        unknown medical history  Other Father        unknown medical history   Seizures Maternal Grandmother    Hypertension Other    Seizures Other      Allergies: Allergies as of 09/16/2022   (No Known Allergies)    Review of Systems: A complete ROS was negative except as per HPI.   OBJECTIVE:   Physical Exam: Blood pressure 123/83, pulse 72, temperature 99.5 F (37.5 C), temperature source Oral, resp. rate 15, height 5\' 6"  (1.676 m), weight 62.6 kg, SpO2 100 %.  Constitutional: Tired appearing adult male. In no acute distress. HENT: Normocephalic,  atraumatic,  Eyes: Sclera non-icteric, PERRL, EOM intact Neck: No nuchal rigidity or tenderness, no cervical spine tenderness Cardio:Regular rate and rhythm.  No appreciable murmurs. Pulm:Clear to auscultation bilaterally. Normal work of breathing on room air. Abdomen: Soft, diffusely tender to palpation, non-distended, positive bowel sounds. for extremity edema. Skin:Warm and dry. Neuro: Alert but answered no to orientation questions.  Follows most commands but not consistently and this limited full neuroexam.  PERRL, EOM intact, no facial droop, moves all extremities well against gravity.  No focal deficits noted. Psych:Pleasant mood and affect.  Labs: Results for orders placed or performed during the hospital encounter of 09/16/22 (from the past 24 hour(s))  I-stat chem 8, ED (not at [facility], [facility] or [facility])     Status: Abnormal   Collection Time: 09/16/22  2:06 PM  Result Value Ref Range   Sodium 139 135 - 145 mmol/L   Potassium 3.9 3.5 - 5.1 mmol/L   Chloride 102 98 - 111 mmol/L   BUN 5 (L) 6 - 20 mg/dL   Creatinine, Ser 09/18/22 0.61 - 1.24 mg/dL   Glucose, Bld 1.79 (H) 70 - 99 mg/dL   Calcium, Ion 150 (L) 1.15 - 1.40 mmol/L   TCO2 26 22 - 32 mmol/L   Hemoglobin 15.0 13.0 - 17.0 g/dL   HCT 5.69 79.4 - 80.1 %  Salicylate level     Status: Abnormal   Collection Time: 09/16/22  2:47 PM  Result Value Ref Range   Salicylate Lvl <7.0 (L) 7.0 - 30.0 mg/dL  Acetaminophen level     Status: Abnormal   Collection Time: 09/16/22  2:47 PM  Result Value Ref Range   Acetaminophen (Tylenol), Serum <10 (L) 10 - 30 ug/mL  Ethanol     Status: None   Collection Time: 09/16/22  2:47 PM  Result Value Ref Range   Alcohol, Ethyl (B) <10 <10 mg/dL  CBC with Differential     Status: None   Collection Time: 09/16/22  2:47 PM  Result Value Ref Range   WBC 6.2 4.0 - 10.5 K/uL   RBC 4.46 4.22 - 5.81 MIL/uL   Hemoglobin 14.0 13.0 - 17.0 g/dL   HCT 09/18/22 37.4 - 82.7 %   MCV 96.0 80.0 - 100.0 fL    MCH 31.4 26.0 - 34.0 pg   MCHC 32.7 30.0 - 36.0 g/dL   RDW 07.8 67.5 - 44.9 %   Platelets 348 150 - 400 K/uL   nRBC 0.0 0.0 - 0.2 %   Neutrophils Relative % 74 %   Neutro Abs 4.6 1.7 - 7.7 K/uL   Lymphocytes Relative 18 %   Lymphs Abs 1.1 0.7 - 4.0 K/uL   Monocytes Relative 5 %   Monocytes Absolute 0.3 0.1 - 1.0 K/uL   Eosinophils Relative 2 %   Eosinophils Absolute 0.1 0.0 - 0.5 K/uL   Basophils Relative 1 %  Basophils Absolute 0.0 0.0 - 0.1 K/uL   Immature Granulocytes 0 %   Abs Immature Granulocytes 0.02 0.00 - 0.07 K/uL  Lactic acid, plasma     Status: None   Collection Time: 09/16/22  2:48 PM  Result Value Ref Range   Lactic Acid, Venous 1.8 0.5 - 1.9 mmol/L  Ammonia     Status: Abnormal   Collection Time: 09/16/22  2:48 PM  Result Value Ref Range   Ammonia 67 (H) 9 - 35 umol/L  Urine rapid drug screen (hosp performed)     Status: Abnormal   Collection Time: 09/16/22  5:55 PM  Result Value Ref Range   Opiates NONE DETECTED NONE DETECTED   Cocaine POSITIVE (A) NONE DETECTED   Benzodiazepines NONE DETECTED NONE DETECTED   Amphetamines NONE DETECTED NONE DETECTED   Tetrahydrocannabinol POSITIVE (A) NONE DETECTED   Barbiturates NONE DETECTED NONE DETECTED  Urinalysis, Routine w reflex microscopic     Status: None   Collection Time: 09/16/22  5:56 PM  Result Value Ref Range   Color, Urine YELLOW YELLOW   APPearance CLEAR CLEAR   Specific Gravity, Urine 1.015 1.005 - 1.030   pH 6.0 5.0 - 8.0   Glucose, UA NEGATIVE NEGATIVE mg/dL   Hgb urine dipstick NEGATIVE NEGATIVE   Bilirubin Urine NEGATIVE NEGATIVE   Ketones, ur NEGATIVE NEGATIVE mg/dL   Protein, ur NEGATIVE NEGATIVE mg/dL   Nitrite NEGATIVE NEGATIVE   Leukocytes,Ua NEGATIVE NEGATIVE     Imaging: Chest x-ray with no acute abnormalities. CT head without contrast with no intracranial abnormalities.  EKG: personally reviewed my interpretation is normal sinus rhythm, normal axis, no prolonged intervals or ST  changes.  Consistent with prior EKG from 07/20/2022.  ASSESSMENT & PLAN:   Assessment & Plan by Problem: Principal Problem:   Acute encephalopathy Active Problems:   History of bipolar disorder   Seizures (HCC)   Abdominal pain   Polysubstance use disorder   JAYLYN BOOHER is a 36 y.o. person living with a history of seizure disorder and bipolar disorder who presented with a syncopal episode was responsive to Narcan with a possible postictal period and admitted for observation with history of seizures on hospital day 0  Seizure disorder Patient with history of seizure disorders and has seen neurology in the past, last visit 03/2021.  It appears that he has been on lamotrigine and Keppra but states today that he is not been taking these medications.  With his syncopal episode and possible postictal state we contacted neurology for further evaluation.  Neurology noted with his reported medication nonadherence it is most likely that this is the cause of his seizure and that EEG at this time would not change management.  Neurology recommends resuming home lamotrigine and Keppra if it has been less than 1 to 2 weeks since he has not gotten the medication.  We will monitor him overnight and restart his home seizure medications.  Due to his reluctance to answer questions and unknown baseline is hard to tell if he is postictal but he appears overall nontoxic.  Anticipate discharge tomorrow if stable overnight. - Appreciate neurology recommendations - Keppra level - Lamotrigine 150 mg twice daily - Keppra 1 g IV twice daily - Ativan 2 mg as needed for seizure abortion  Abdominal pain Nausea, vomiting, diarrhea Muscle pain Patient complains of lower quadrant abdominal pain, nausea, vomiting, and diarrhea with an unknown timeframe.  On exam he twice attempted to vomit but only spit up clear mucus.  He also noted muscle pains that are primarily in his calves and his thighs that were worse with  palpation.  It does not sound like he lays down for an extended period of time but with his history of tonic-clonic seizures there is mild concern for rhabdo.  He has also had gastroenteritis in the past and his current symptoms do not appear overtly infectious or severe.  He appears well-hydrated overall. Will evaluate for liver dysfunction and pancreatitis.  - CK, hepatic function panel, lipase - LR 125 mL/h, Zofran as needed, Dilaudid 1 mg every 4 hours as needed  Polysubstance use disorder UDS positive for cocaine and THC.  Reports current use of opiates without IV drug use.  Denies alcohol use. - CIWA, COWS  Bipolar disorder Patient has history of bipolar disorder and is on aripiprazole injections every 4 weeks.  Unknown last administration.  Diet: Normal VTE: Enoxaparin IVF: LR,125cc/hr Code: Full  Prior to Admission Living Arrangement:  [facility]ison Anticipated Discharge Location:  Prison Barriers to Discharge: Further evaluation and management of possible seizure  Dispo: Admit patient to Observation with expected length of stay less than 2 midnights.  Signed: Rocky Morel, DO Internal Medicine Resident PGY-1  09/16/2022, 7:56 PM   Dr. Rocky Morel, DO Pager [phone]

## 2022-09-16 NOTE — ED Notes (Signed)
Per [facility] nurse patient has hx of meth, fentanyl, marijuana and heavy alcohol abuse.

## 2022-09-17 ENCOUNTER — Observation Stay (HOSPITAL_COMMUNITY)

## 2022-09-17 DIAGNOSIS — R45851 Suicidal ideations: Secondary | ICD-10-CM | POA: Diagnosis present

## 2022-09-17 DIAGNOSIS — Z5902 Unsheltered homelessness: Secondary | ICD-10-CM | POA: Diagnosis present

## 2022-09-17 DIAGNOSIS — Z91148 Patient's other noncompliance with medication regimen for other reason: Secondary | ICD-10-CM | POA: Diagnosis not present

## 2022-09-17 DIAGNOSIS — Z9151 Personal history of suicidal behavior: Secondary | ICD-10-CM | POA: Diagnosis not present

## 2022-09-17 DIAGNOSIS — G9341 Metabolic encephalopathy: Secondary | ICD-10-CM | POA: Diagnosis present

## 2022-09-17 DIAGNOSIS — G9340 Encephalopathy, unspecified: Secondary | ICD-10-CM

## 2022-09-17 DIAGNOSIS — K219 Gastro-esophageal reflux disease without esophagitis: Secondary | ICD-10-CM | POA: Diagnosis present

## 2022-09-17 DIAGNOSIS — Z8249 Family history of ischemic heart disease and other diseases of the circulatory system: Secondary | ICD-10-CM | POA: Diagnosis not present

## 2022-09-17 DIAGNOSIS — G8929 Other chronic pain: Secondary | ICD-10-CM | POA: Diagnosis present

## 2022-09-17 DIAGNOSIS — T40602A Poisoning by unspecified narcotics, intentional self-harm, initial encounter: Secondary | ICD-10-CM | POA: Diagnosis not present

## 2022-09-17 DIAGNOSIS — F17210 Nicotine dependence, cigarettes, uncomplicated: Secondary | ICD-10-CM | POA: Diagnosis present

## 2022-09-17 DIAGNOSIS — F319 Bipolar disorder, unspecified: Secondary | ICD-10-CM

## 2022-09-17 DIAGNOSIS — R1030 Lower abdominal pain, unspecified: Secondary | ICD-10-CM | POA: Diagnosis not present

## 2022-09-17 DIAGNOSIS — R112 Nausea with vomiting, unspecified: Secondary | ICD-10-CM | POA: Diagnosis present

## 2022-09-17 DIAGNOSIS — R079 Chest pain, unspecified: Secondary | ICD-10-CM | POA: Diagnosis present

## 2022-09-17 DIAGNOSIS — Z79899 Other long term (current) drug therapy: Secondary | ICD-10-CM | POA: Diagnosis not present

## 2022-09-17 DIAGNOSIS — F1914 Other psychoactive substance abuse with psychoactive substance-induced mood disorder: Secondary | ICD-10-CM | POA: Diagnosis present

## 2022-09-17 DIAGNOSIS — R109 Unspecified abdominal pain: Secondary | ICD-10-CM | POA: Diagnosis present

## 2022-09-17 DIAGNOSIS — G40909 Epilepsy, unspecified, not intractable, without status epilepticus: Secondary | ICD-10-CM | POA: Diagnosis present

## 2022-09-17 DIAGNOSIS — R197 Diarrhea, unspecified: Secondary | ICD-10-CM | POA: Diagnosis present

## 2022-09-17 LAB — MRSA NEXT GEN BY PCR, NASAL: MRSA by PCR Next Gen: NOT DETECTED

## 2022-09-17 MED ORDER — LORAZEPAM 2 MG/ML IJ SOLN: 1.0000 mg | INTRAMUSCULAR | Status: DC | PRN

## 2022-09-17 MED ORDER — FOLIC ACID 1 MG PO TABS
1.0000 mg | ORAL_TABLET | Freq: Every day | ORAL | Status: DC
  Administered 2022-09-17 – 2022-09-18 (×2): 1 mg via ORAL
  Filled 2022-09-17 (×2): qty 1

## 2022-09-17 MED ORDER — ARIPIPRAZOLE 10 MG PO TABS
5.0000 mg | ORAL_TABLET | Freq: Every day | ORAL | Status: AC
  Administered 2022-09-17 – 2022-09-18 (×2): 5 mg via ORAL
  Filled 2022-09-17 (×2): qty 1

## 2022-09-17 MED ORDER — DICLOFENAC SODIUM 1 % EX GEL
2.0000 g | Freq: Four times a day (QID) | CUTANEOUS | Status: DC
  Administered 2022-09-17 – 2022-09-18 (×4): 2 g via TOPICAL
  Filled 2022-09-17: qty 100

## 2022-09-17 MED ORDER — THIAMINE MONONITRATE 100 MG PO TABS
100.0000 mg | ORAL_TABLET | Freq: Every day | ORAL | Status: DC
  Administered 2022-09-17 – 2022-09-18 (×2): 100 mg via ORAL
  Filled 2022-09-17 (×2): qty 1

## 2022-09-17 MED ORDER — LEVETIRACETAM 500 MG PO TABS
1000.0000 mg | ORAL_TABLET | Freq: Two times a day (BID) | ORAL | Status: DC
  Administered 2022-09-17 – 2022-09-18 (×3): 1000 mg via ORAL
  Filled 2022-09-17 (×3): qty 2

## 2022-09-17 MED ORDER — LORAZEPAM 1 MG PO TABS
1.0000 mg | ORAL_TABLET | ORAL | Status: DC | PRN
  Administered 2022-09-17: 2 mg via ORAL
  Filled 2022-09-17 (×2): qty 1

## 2022-09-17 MED ORDER — THIAMINE HCL 100 MG/ML IJ SOLN: 100.0000 mg | Freq: Every day | INTRAMUSCULAR | Status: DC

## 2022-09-17 MED ORDER — ADULT MULTIVITAMIN W/MINERALS CH
1.0000 | ORAL_TABLET | Freq: Every day | ORAL | Status: DC
  Administered 2022-09-17 – 2022-09-18 (×2): 1 via ORAL
  Filled 2022-09-17 (×2): qty 1

## 2022-09-17 NOTE — Discharge Instructions (Signed)
Resumed abilify 5mg  po daily x 2 days ,then increase abilify 10mg  po daily. On day 8 of oral abilify may administer Abilify Maintenna 400mg  IM q 28days. Recommend suicide precautions as he endorsed suicide attempt to avoid arrest.  -He expresses interest in substance use rehab, if he remains in the window for detox and rehab(dependent upon custody time).

## 2022-09-17 NOTE — Progress Notes (Addendum)
Subjective: He is having pain in stomach and upper leg. He can tell when he's gonna have a seizure.He passed out, his heart was beating fast, he had nausea and vomiting.Gets meds everyday, but he does not take them.He hasn't taken in weeks but says he sees Sees Dr.Johnson with [facility]. Also sees behavioral health.Reports taking THC, cocaine, fentanyl everyday before going into jail.Been in jail few days, 4 days total but appears to transition in and out fairly frequently.Endorses that recently he has had a hard time being homeless, kids not wanting to do anything with him because he hasn't been himself, using substances because of how stressed out he is. Desires rehab.  Says he used fentanyl to try and kill himself recently. Says he would be a risk of trying again but doesn't know how he would do it. Really doesn't want to go back to jail.  Objective:  Vital signs in last 24 hours: Vitals:   09/16/22 2248 09/17/22 0310 09/17/22 0404 09/17/22 0945  BP: (!) 128/90 (!) 134/94  (!) 119/93  Pulse: 81 74  71  Resp: _0 Temp: 98.7 F (37.1 C) 98.9 F (37.2 C)  98.6 F (37 C)  TempSrc: Oral Oral    SpO2: 100% 100%  100%  Weight:   65.5 kg   Height:       ZES:PQZRAQTMA,UQJFHLKT, well nourished, well developed HEENT: poor dentition, no scleral icterus, EOMI, no evidence of tongue wound CV:RRR, no m/r/g, normal s1/s2 2+ bilateral radial pulses Pulm: normal wob, LCTAB GYB:WLSL, non-distended diffuse tenderness to palpation of the R and L LQ, no pain when pressing with the stethoscop and telling patient I am listening, no rebound or guarding, nor palpable massess or organomegaly Extr: warm, dry, no LE edema, no evidenc of needl track marks MSK: diffuse sternal and parasternal ptp, no palpable crepitus or stepoffs, no palpable bony deformity Psych: endorses SI, flat affect, euthymic, some decreased engagement with answering questions, poor eye contact, normal rate and  rhythm of speech, normal voice loudness  Assessment/Plan:  Principal Problem:   Acute encephalopathy Active Problems:   History of bipolar disorder   Seizures (HCC)   Abdominal pain   Polysubstance use disorder   Acute metabolic encephalopathy  [name] is a 36 y.o. person living with a history of seizure disorder and bipolar disorder who presented upon transfer from local jail with a syncopal episode vs seizure (vs found down - we do not know if it was witnessed).  Reportedly received CPR and was administered Narcan.  May have experienced a postictal period and was admitted for observation with history of seizures, now hospital day 1.   Bipolar disorder Substance induced Mood disorder Suicidal Ideation. Patient has history of bipolar disorder and is prescribed aripiprazole injections every 4 weeks.  Unknown last administration. On exam today says he attempted suicide by fentanyl OD prior to going to jail. Today he endorses that when he leaves here he will try again but is unsure how at this time. Given his social situation including homelessness, substance use, recent life changes including loss of the relationship with his children, in conjunction with his prior attempt and current endorsement of SI will get psychiatry evaluation for safety contract vs continued need for admission. Can also get better recommendations from them about BPD meds as the [facility] does not show administration of aripiprazole despite being ordered QHS.  [name] requested assistance accessing rehab.    -F/u psych  recs  Seizure disorder The patient presented with c/f seizure vs opioid overdose vs malingering. This is most likely seizure given that EMS witnessed the event, he has a known history, reports tongue biting, and would not attribute to malingering given that there are other reasonable causes that make medical sense, and he did undergo some period of CPR which would not be tolerated in a conscious  individual. Do not suspect opioid overdose as he has not been taking these the past 4 days at minimum. Do not suspect alcohol or benzo withdrawal given COWS score of 0 with scoring also done in jail, no benzo use on UDS, and also no reported alcohol or prior ED/hospital visit with elevated ethanol level. Additionally would not suspect withdrawal seizure from alcohol with no other signs of withdrawal. He has seen neurology in the past, last visit 03/2021.  It appears that he has been on lamotrigine and Keppra but states that he is not been taking these medications. I do suspect this was a seizure in the setting of medication non adherence which directly stems from the patient's housing instability, mood disorder, substance use and frequent transition in and out of jail. Per neurology no EEG and will restart patient on home meds in the hospital. - Appreciate neurology recommendations - encourage cocaine cessation - Keppra level - Lamotrigine 150 mg twice daily - Keppra 1 g PO twice daily - Ativan 2 mg as needed for seizure abortion  Chest pain CPR The patient had CPR performed for 1 minute by EMS as they thought there was no pulse. The patient reports chest pain while laying in bed and pain with inspiration. On exam there is diffuse sternal and parasternal ptp, no palpable crepitus or stepoffs, no palpable bony deformity. CXR without evidence of fracture. -Voltaren gel   Abdominal pain Nausea, vomiting, diarrhea Patient complains of lower quadrant abdominal pain, nausea, vomiting, and diarrhea with an unknown timeframe. He is unable to articulate the chronicity of these problems. On exam his abdomen is soft, non-distended, diffusely tender to palpation of the R and L LQ, no pain when pressing with the stethoscope and telling patient I am listening, no rebound or guarding, nor palpable massess or organomegaly. Per chart review appears these symptoms have been going on for years and he has been following  with [facility] since 2021 and has had multiple negative EGDs and a negative colonoscopy. Also had a gastric emptying study with normal 2 hour studying but decreased emptying over time. Has not had opioids since being in jail the past 4 days and does not have dilated pupils or piloerection or yawning to suggest opioid withdrawal. No evidence of rhabdo given normal CK. Lipase wnl. Normal AST/ALT and alk phos/ bili with no concern for hepatobiliary process.Also doesn't seem like gastroenteritis given how long standing these general symptoms have been for years. He does have a history of marijuana hyperemesis but given no recent use I do not suspect this. These episodes also seem to occur outside of seizures so I do not think they are aura like symptoms.  Given afebrile with no elevated WBC and HDS will work on increasing PO intake, controlling nausea and pain. -Zofran as needed   Housing instabilityPolysubstance use disorder UDS positive for cocaine and THC.  Reports current use of opiates without IV drug use.  Denies alcohol use. He wants help with rehab. -TOC consult  Prior to Admission Living Arrangement:  Jail Anticipated Discharge Location:  Jail Barriers to Discharge: Further evaluation  and management of possible seizure, suicidal ideation evaluation Dispo: Anticipated discharge in approximately 1 day(s).   Iona Coach, MD 09/17/2022, 1:16 PM Pager: [phone] After 5pm on weekdays and 1pm on weekends: On Call pager [phone]

## 2022-09-17 NOTE — Consult Note (Signed)
[name] Psychiatry Consult   Reason for Consult:   Referring Physician:  Patient Identification: Greg Morales MRN:  [id number] Principal Diagnosis: Acute encephalopathy Diagnosis:  Active Problems:   History of bipolar disorder   Seizures (HCC)   Abdominal pain   Polysubstance use disorder   Unsheltered homelessness   Total Time spent with patient: 30 minutes  Subjective:   Greg Morales is a 36 y.o. male patient admitted with syncopal episode, status epilectus Patient endorse suicidal ideation with recent attempt during his intake, and psychiatry was consulted.  Psychiatry was consulted to assist with psychiatric assessment and treatment/disposition planning. The chart has been reviewed and pertinent findings are noted below. Based on this review and assessment, the treatment plan has been created and discussed with the treatment team.    He tells me that he is interested in substance use rehab. He reports that he has a significant polysubstance use history to include alcohol and needs rehab When assessing for suicidal ideation and recent attempt, he reports that he had attempted suicide the day he was taken into custody by overdosing on fentanyl and crack cocaine. He further reports this was his first time doing either substance, and did agree his attempt was to avoid going to jail. He denies any other attempts or lifetime history outside of this. He identifies stressors such as losing house, kids, homeless, and no money as stressors that have contributed to his increase in substance use. He denies worsening mood as a result of these stressors.   He denies any acute symptoms of mania at this time to include impulsivity, grandiosity, mood lability, hypersexuality.  He does not present with any of the above symptoms on this evaluation. He further denies any depressive symptoms to include anhedonia, hopelessness, worthlessness, guilty, suicidal.  He denies any acute psychosis,  paranoia.  He does not appear to be displaying any or responding to internal stimuli, external stimuli, or exhibiting delusional thought disorder.  Patient denies any access to weapons, denies any alcohol and or substance abuse.  He reports moderate sleep and fair appetite.  Patient denies any auditory and/or visual hallucinations, does not appear to be responding to internal or external stimuli.  There is no evidence of delusional thought content and patient appears to answer all questions appropriately.  Patient endorses having a mental health diagnosis of bipolar and reports that he has never received outpatient counseling.  He takes abilify 10mg  po daily for treatment of his bipolar.   On evaluation patient is alert and oriented x 4.  Patient is calm and cooperative, engages with this psychiatric provider although he is very vague and guarded at times.  Patient's eye contact, speech, mood, all appear to be normal.  Patient continues to refute any suicidality at this time.  He further denies any recent suicidal thoughts, suicidal ideations, and or nonsuicidal self-injurious behavior.  He is also able to contract for safety, and historically has sought help when his mood has changed and or he begins to feel suicidal.  He does appear to be open to medication management in the [place] setting, once he is medically stable to return. In the event he continues to need substance abuse rehab he can be evaluated by clinician at that time.  On assessment, the patient is alert & oriented X 4. He is able to make needs known. He does not appear to be responding to internal stimuli. There are no signs and symptom of mania or psychosis.   HPI:  Greg Morales is a 36 y/o male with PMH of seizure disorder and bipolar disorder who presented to the ED after a syncopal episode this morning and received CPR for 1 minute and then responded to Narcan.  Following this episode he was found to be postictal due to being  combative and he required sedation when EMS saw him.   During our visit he was reluctant to answer most questions but did tell us that he has been using opiates along with other drugs while in prison and has had some nausea, vomiting, and diarrhea but would not answer any other questions about that.  He reports not having his seizure medications recently but again does not specify this time period.  He denies any fevers, neck pain, neck rigidity.   He is also having some muscle pain which he states is primarily in his calfs and thighs but does not tell us how long it has been going on.  Denies any dysuria or hematuria.  Past Psychiatric History: Bipolar, previously compliant with Abilify Maintenna. No current psychiatric provider at this time. He is in custody and multiple charges pending, he is not forthcoming with charges. Officer at bedside unable to provide at this time.   Risk to Self:  Denies Risk to Others:   Denies Prior Inpatient Therapy:   Denies Prior Outpatient Therapy:  Denies  Past Medical History:  Past Medical History:  Diagnosis Date   Bipolar disorder (HCC)    Chronic abdominal pain    Medically noncompliant    Polysubstance abuse (HCC)    Seizures (HCC)    since childhood    Past Surgical History:  Procedure Laterality Date   BIOPSY  09/03/2020   Procedure: BIOPSY;  Surgeon: Beverley FiedlerPyrtle, Jay M, MD;  Location: [facility];  Service: Gastroenterology;;   ESOPHAGOGASTRODUODENOSCOPY     ESOPHAGOGASTRODUODENOSCOPY (EGD) WITH PROPOFOL N/A 09/03/2020   Procedure: ESOPHAGOGASTRODUODENOSCOPY (EGD) WITH PROPOFOL;  Surgeon: Beverley FiedlerPyrtle, Jay M, MD;  Location: [facility];  Service: Gastroenterology;  Laterality: N/A;   Family History:  Family History  Problem Relation Age of Onset   Other Mother        unknown medical history   Other Father        unknown medical history   Seizures Maternal Grandmother    Hypertension Other    Seizures Other    Family Psychiatric  History:  REports mother and father diagnosed with Bipolar"ish".  Social History:  Social History   Substance and Sexual Activity  Alcohol Use Not Currently     Social History   Substance and Sexual Activity  Drug Use Yes   Types: Marijuana   Comment: uses three times weekly    Social History   Socioeconomic History   Marital status: Married    Spouse name: Dois DavenportSandra   Number of children: 3   Years of education: Not on file   Highest education level: Not on file  Occupational History   Occupation: Currently seeking disability  Tobacco Use   Smoking status: Every Day    Packs/day: 1.00    Years: 5.00    Total pack years: 5.00    Types: Cigarettes   Smokeless tobacco: Never  Vaping Use   Vaping Use: Never used  Substance and Sexual Activity   Alcohol use: Not Currently   Drug use: Yes    Types: Marijuana    Comment: uses three times weekly   Sexual activity: Not Currently  Other Topics Concern   Not on file  Social History Narrative   History of seizures since childhood per patient.   Lives at home with his family.   Right-handed.   No daily caffeine use.   Social Determinants of Health   Financial Resource Strain: Not on file  Food Insecurity: Not on file  Transportation Needs: Not on file  Physical Activity: Not on file  Stress: Not on file  Social Connections: Not on file   Additional Social History:    Allergies:  No Known Allergies  Labs:  Results for orders placed or performed during the hospital encounter of 09/16/22 (from the past 48 hour(s))  I-stat chem 8, ED (not at [facility], [facility] or [facility])     Status: Abnormal   Collection Time: 09/16/22  2:06 PM  Result Value Ref Range   Sodium 139 135 - 145 mmol/L   Potassium 3.9 3.5 - 5.1 mmol/L   Chloride 102 98 - 111 mmol/L   BUN 5 (L) 6 - 20 mg/dL   Creatinine, Ser 1.61 0.61 - 1.24 mg/dL   Glucose, Bld 096 (H) 70 - 99 mg/dL    Comment: Glucose reference range applies only to samples taken after fasting for at least  8 hours.   Calcium, Ion 1.02 (L) 1.15 - 1.40 mmol/L   TCO2 26 22 - 32 mmol/L   Hemoglobin 15.0 13.0 - 17.0 g/dL   HCT 04.5 40.9 - 81.1 %  Salicylate level     Status: Abnormal   Collection Time: 09/16/22  2:47 PM  Result Value Ref Range   Salicylate Lvl <7.0 (L) 7.0 - 30.0 mg/dL    Comment: Performed at [facility], [address] [place], [place] [address]  Acetaminophen level     Status: Abnormal   Collection Time: 09/16/22  2:47 PM  Result Value Ref Range   Acetaminophen (Tylenol), Serum <10 (L) 10 - 30 ug/mL    Comment: (NOTE) Therapeutic concentrations vary significantly. A range of 10-30 ug/mL  may be an effective concentration for many patients. However, some  are best treated at concentrations outside of this range. Acetaminophen concentrations >150 ug/mL at 4 hours after ingestion  and >50 ug/mL at 12 hours after ingestion are often associated with  toxic reactions.  Performed at [facility], [address] [place], [place] [address]   Ethanol     Status: None   Collection Time: 09/16/22  2:47 PM  Result Value Ref Range   Alcohol, Ethyl (B) <10 <10 mg/dL    Comment: (NOTE) Lowest detectable limit for serum alcohol is 10 mg/dL.  For medical purposes only. Performed at [facility], [address] [place], [place] [address]   CBC with Differential     Status: None   Collection Time: 09/16/22  2:47 PM  Result Value Ref Range   WBC 6.2 4.0 - 10.5 K/uL   RBC 4.46 4.22 - 5.81 MIL/uL   Hemoglobin 14.0 13.0 - 17.0 g/dL   HCT 57.8 46.9 - 62.9 %   MCV 96.0 80.0 - 100.0 fL   MCH 31.4 26.0 - 34.0 pg   MCHC 32.7 30.0 - 36.0 g/dL   RDW 52.8 41.3 - 24.4 %   Platelets 348 150 - 400 K/uL   nRBC 0.0 0.0 - 0.2 %   Neutrophils Relative % 74 %   Neutro Abs 4.6 1.7 - 7.7 K/uL   Lymphocytes Relative 18 %   Lymphs Abs 1.1 0.7 - 4.0 K/uL   Monocytes Relative 5 %  Monocytes Absolute 0.3 0.1 - 1.0 K/uL   Eosinophils Relative 2 %   Eosinophils Absolute 0.1  0.0 - 0.5 K/uL   Basophils Relative 1 %   Basophils Absolute 0.0 0.0 - 0.1 K/uL   Immature Granulocytes 0 %   Abs Immature Granulocytes 0.02 0.00 - 0.07 K/uL    Comment: Performed at [facility], [address], [place], [place] [address]  Lactic acid, plasma     Status: None   Collection Time: 09/16/22  2:48 PM  Result Value Ref Range   Lactic Acid, Venous 1.8 0.5 - 1.9 mmol/L    Comment: Performed at [facility], [address], [place], [place] [address]  Ammonia     Status: Abnormal   Collection Time: 09/16/22  2:48 PM  Result Value Ref Range   Ammonia 67 (H) 9 - 35 umol/L    Comment: Performed at [facility], [address] [place], [place] [address]  Urine rapid drug screen (hosp performed)     Status: Abnormal   Collection Time: 09/16/22  5:55 PM  Result Value Ref Range   Opiates NONE DETECTED NONE DETECTED   Cocaine POSITIVE (A) NONE DETECTED   Benzodiazepines NONE DETECTED NONE DETECTED   Amphetamines NONE DETECTED NONE DETECTED   Tetrahydrocannabinol POSITIVE (A) NONE DETECTED   Barbiturates NONE DETECTED NONE DETECTED    Comment: (NOTE) DRUG SCREEN FOR MEDICAL PURPOSES ONLY.  IF CONFIRMATION IS NEEDED FOR ANY PURPOSE, NOTIFY LAB WITHIN 5 DAYS.  LOWEST DETECTABLE LIMITS FOR URINE DRUG SCREEN Drug Class                     Cutoff (ng/mL) Amphetamine and metabolites    1000 Barbiturate and metabolites    200 Benzodiazepine                 200 Opiates and metabolites        300 Cocaine and metabolites        300 THC                            50 Performed at [facility], [address], [place], [place] [address]   Urinalysis, Routine w reflex microscopic     Status: None   Collection Time: 09/16/22  5:56 PM  Result Value Ref Range   Color, Urine YELLOW YELLOW   APPearance CLEAR CLEAR   Specific Gravity, Urine 1.015 1.005 - 1.030   pH 6.0 5.0 - 8.0   Glucose, UA NEGATIVE NEGATIVE mg/dL   Hgb urine dipstick NEGATIVE NEGATIVE    Bilirubin Urine NEGATIVE NEGATIVE   Ketones, ur NEGATIVE NEGATIVE mg/dL   Protein, ur NEGATIVE NEGATIVE mg/dL   Nitrite NEGATIVE NEGATIVE   Leukocytes,Ua NEGATIVE NEGATIVE    Comment: Performed at [facility], [address] [place], [place] [address]  CK     Status: None   Collection Time: 09/16/22  7:35 PM  Result Value Ref Range   Total CK 155 49 - 397 U/L    Comment: HEMOLYSIS AT THIS LEVEL MAY AFFECT RESULT Performed at [facility], [address] [place], [place] [address]   Hepatic function panel     Status: Abnormal   Collection Time: 09/16/22  7:35 PM  Result Value Ref Range   Total Protein 6.3 (L) 6.5 - 8.1 g/dL   Albumin 3.1 (L) 3.5 - 5.0 g/dL  AST 23 15 - 41 U/L    Comment: HEMOLYSIS AT THIS LEVEL MAY AFFECT RESULT   ALT 12 0 - 44 U/L    Comment: HEMOLYSIS AT THIS LEVEL MAY AFFECT RESULT   Alkaline Phosphatase 47 38 - 126 U/L   Total Bilirubin 0.8 0.3 - 1.2 mg/dL    Comment: HEMOLYSIS AT THIS LEVEL MAY AFFECT RESULT   Bilirubin, Direct 0.3 (H) 0.0 - 0.2 mg/dL   Indirect Bilirubin 0.5 0.3 - 0.9 mg/dL    Comment: Performed at [facility], [address] [place], [place] [address]  Lipase, blood     Status: None   Collection Time: 09/16/22  7:35 PM  Result Value Ref Range   Lipase 34 11 - 51 U/L    Comment: Performed at [facility], [address], [place], [place] [address]  MRSA Next Gen by PCR, Nasal     Status: None   Collection Time: 09/16/22 11:06 PM   Specimen: Nasal Mucosa; Nasal Swab  Result Value Ref Range   MRSA by PCR Next Gen NOT DETECTED NOT DETECTED    Comment: (NOTE) The GeneXpert MRSA Assay (FDA approved for NASAL specimens only), is one component of a comprehensive MRSA colonization surveillance program. It is not intended to diagnose MRSA infection nor to guide or monitor treatment for MRSA infections. Test performance is not FDA approved in patients less than 20 years old. Performed at [facility],  [address], [place], [place] [address]     Current Facility-Administered Medications  Medication Dose Route Frequency Provider Last Rate Last Admin   acetaminophen (TYLENOL) tablet 650 mg  650 mg Oral Q6H PRN Doran Stabler, DO       Or   acetaminophen (TYLENOL) suppository 650 mg  650 mg Rectal Q6H PRN Doran Stabler, DO       diclofenac Sodium (VOLTAREN) 1 % topical gel 2 g  2 g Topical QID Willette Cluster, MD       enoxaparin (LOVENOX) injection 40 mg  40 mg Subcutaneous Q24H Doran Stabler, DO   40 mg at 09/16/22 2113   folic acid (FOLVITE) tablet 1 mg  1 mg Oral Daily Cinderella, Margaret A       lamoTRIgine (LAMICTAL) tablet 150 mg  150 mg Oral BID Doran Stabler, DO   150 mg at 09/17/22 5329   levETIRAcetam (KEPPRA) tablet 1,000 mg  1,000 mg Oral BID Doran Stabler, DO   1,000 mg at 09/17/22 9242   LORazepam (ATIVAN) tablet 1-4 mg  1-4 mg Oral Q1H PRN Cinderella, Margaret A       Or   LORazepam (ATIVAN) injection 1-4 mg  1-4 mg Intravenous Q1H PRN Cinderella, Margaret A       LORazepam (ATIVAN) injection 2 mg  2 mg Intravenous PRN Doran Stabler, DO       multivitamin with minerals tablet 1 tablet  1 tablet Oral Daily Cinderella, Margaret A       ondansetron (ZOFRAN) injection 4 mg  4 mg Intravenous Q6H PRN Doran Stabler, DO   4 mg at 09/17/22 6834   thiamine (VITAMIN B1) tablet 100 mg  100 mg Oral Daily Cinderella, Margaret A       Or   thiamine (VITAMIN B1) injection 100 mg  100 mg Intravenous Daily Cinderella, Margaret A        Musculoskeletal: Strength & Muscle Tone: within normal limits Gait & Station: normal Patient leans: N/A  Psychiatric Specialty Exam:  Presentation  General Appearance:  Appropriate for Environment; Casual  Eye Contact: Fair  Speech: Clear and Coherent; Normal Rate  Speech Volume: Normal  Handedness: Right   Mood and Affect  Mood: Euthymic  Affect: Appropriate; Congruent   Thought Process  Thought Processes: Coherent;  Goal Directed  Descriptions of Associations:Intact  Orientation:Full (Time, Place and Person)  Thought Content:Logical  History of Schizophrenia/Schizoaffective disorder:No data recorded Duration of Psychotic Symptoms:No data recorded Hallucinations:Hallucinations: None  Ideas of Reference:None  Suicidal Thoughts:Suicidal Thoughts: No  Homicidal Thoughts:Homicidal Thoughts: No   Sensorium  Memory: Immediate Good; Recent Good; Remote Good  Judgment: Fair  Insight: Fair   Chartered certified accountant: Fair  Attention Span: Fair  Recall: [employer] of Knowledge: Fair  Language: Fair   Psychomotor Activity  Psychomotor Activity: Psychomotor Activity: Normal   Assets  Assets: Communication Skills; Desire for Improvement; Financial Resources/Insurance; Social Support; Resilience; Physical Health   Sleep  Sleep: Sleep: Fair   Physical Exam: Physical Exam Vitals and nursing note reviewed.  Constitutional:      Appearance: Normal appearance. He is normal weight.  Neurological:     Mental Status: He is alert.  Psychiatric:        Attention and Perception: Attention and perception normal.        Mood and Affect: Mood normal.        Speech: Speech normal.        Behavior: Behavior normal. Behavior is cooperative.        Thought Content: Thought content normal.        Cognition and Memory: Cognition and memory normal.        Judgment: Judgment normal.    Review of Systems  Psychiatric/Behavioral: Negative.    All other systems reviewed and are negative.  Blood pressure (!) 114/103, pulse 81, temperature 98.5 F (36.9 C), temperature source Oral, resp. rate 17, height  (1.676 m), weight 65.5 kg, SpO2 100 %. Body mass index is 23.31 kg/m.  Patient is currently in police custody. He recently endorsed ongoing suicidal ideations and substance use resources to the ED provider which resulted in psychiatric consult. The patient is drowsy  but easy to arouse. He is logical, linear and goal-oriented. Currently denies SI, HI, and AVH and is not currently significantly impaired, psychotic, or manic on exam. Detailed risk assessment is complete based on clinical exam and individual risk factors and acute suicide risk is low and acute violence risk is low. At this time, protective factors outweigh risk factors. Will recommend the patient resume previous medications and in regards to substance use follow the behavioral health response team at jail. WIll recommend Although the patient is not currently psychotic, will recommend abilify 5 mg for stabilization. Titrate to  when appropriate in a clinical setting.   Safety plan is created jointly which involves the patient following up with patient psychiatric services for med management and psychotherapy. At this time, patient is educated and verbalized understanding of mental health resources and other crisis services in the community. They are instructed to call [phone] and present to the nearest emergency room should they experience any SI/HI/AVH or detrimental worsening of their mental health condition.  Based on my assessment, patient does not require inpatient psychiatric admission.   Safety Assessment: Individualized risk factors include: active substance abuse, male gender and noncompliant with medications and/or treatment plan. Individualized protective factors include: Patient has treatable psychiatric disorders and symptoms, No access to firearms or other  means of harm, Future oriented, Reality-based and No history of head injury. Taking the aforementioned non-modifiable and modifiable risk factors in conjunction with his protective factors, the patient is currently felt to be of low imminent risk of harm to self or others. To further mitigate risk, please see the below treatment recommendations.    Treatment Plan Summary: Plan   Resume abilify  po daily x 2 days, then increase to  abilify  po daily for maintenance. Patient can resume abilify maintenna injections on day 8. THis injection [facility] available at [facility].  -Patient is out of window for alcohol detox at this time, CIWA is not needed. COnsider adding COWS for opiate use that he may not be forth coming about.  -TOC for substance abuse resources.  -WIll update disposition instructions with plan for the jail to include suicidal precautions, he is currently in custody and handcuffed therefore he will not need suicide sitter at this time.    Psychiatry to sign off.   Disposition: No evidence of imminent risk to self or others at present.   Patient does not meet criteria for psychiatric inpatient admission.  Maryagnes Amos, FNP 09/17/2022 4:01 PM

## 2022-09-18 DIAGNOSIS — R1030 Lower abdominal pain, unspecified: Secondary | ICD-10-CM | POA: Diagnosis not present

## 2022-09-18 DIAGNOSIS — G40909 Epilepsy, unspecified, not intractable, without status epilepticus: Secondary | ICD-10-CM | POA: Diagnosis not present

## 2022-09-18 DIAGNOSIS — T40602A Poisoning by unspecified narcotics, intentional self-harm, initial encounter: Secondary | ICD-10-CM | POA: Diagnosis not present

## 2022-09-18 DIAGNOSIS — G9340 Encephalopathy, unspecified: Secondary | ICD-10-CM | POA: Diagnosis not present

## 2022-09-18 LAB — GLUCOSE, CAPILLARY: Glucose-Capillary: 84 mg/dL (ref 70–99)

## 2022-09-18 LAB — LEVETIRACETAM LEVEL: Levetiracetam Lvl: 20.5 ug/mL (ref 10.0–40.0)

## 2022-09-18 MED ORDER — ARIPIPRAZOLE 10 MG PO TBDP: 10.0000 mg | ORAL_TABLET | Freq: Every day | ORAL | 0 refills | Status: AC

## 2022-09-18 MED ORDER — ARIPIPRAZOLE ER 400 MG IM PRSY: 400.0000 mg | PREFILLED_SYRINGE | INTRAMUSCULAR | Status: AC

## 2022-09-18 MED ORDER — LAMOTRIGINE 150 MG PO TABS: 150.0000 mg | ORAL_TABLET | Freq: Two times a day (BID) | ORAL | 0 refills | Status: AC

## 2022-09-18 MED ORDER — DICLOFENAC SODIUM 1 % EX GEL: 2.0000 g | Freq: Four times a day (QID) | CUTANEOUS | 0 refills | Status: AC

## 2022-09-18 NOTE — Discharge Summary (Signed)
Name: Greg Morales MRN: [id number] DOB: 01-Nov-1985 36 y.o. PCP: Ladell Pier, MD  Date of Admission: 09/16/2022  1:22 PM Date of Discharge: 09/18/2022 12:38 Attending Physician: Angelica Pou, MD  Discharge Diagnosis: 1. Active Problems:   History of bipolar disorder   Seizures (HCC)   Abdominal pain   Polysubstance use disorder   Unsheltered homelessness    Discharge Medications: Allergies as of 09/18/2022   No Known Allergies      Medication List     STOP taking these medications    amoxicillin-clavulanate 875-125 MG tablet Commonly known as: AUGMENTIN   ARIPiprazole 10 MG tablet Commonly known as: ABILIFY   ARIPiprazole ER 400 MG Prsy prefilled syringe Commonly known as: ABILIFY MAINTENA Replaced by: aripiprazole 10 MG disintegrating tablet   FDgard 25-20.75 MG Caps Generic drug: Car[place] Oil-Levomenthol   ofloxacin 0.3 % ophthalmic solution Commonly known as: OCUFLOX       TAKE these medications    acetaminophen 325 MG tablet Commonly known as: TYLENOL Take 650 mg by mouth 3 (three) times daily as needed for mild pain.   aripiprazole 10 MG disintegrating tablet Commonly known as: ABILIFY Take 1 tablet (10 mg total) by mouth daily for 6 days. Start taking on: September 19, 2022 Replaces: ARIPiprazole ER 400 MG Prsy prefilled syringe   diclofenac Sodium 1 % Gel Commonly known as: VOLTAREN Apply 2 g topically 4 (four) times daily.   hydrALAZINE 50 MG tablet Commonly known as: APRESOLINE Take 50 mg by mouth at bedtime.   lamoTRIgine 150 MG tablet Commonly known as: LAMICTAL Take 1 tablet (150 mg total) by mouth 2 (two) times daily. What changed:  medication strength how much to take when to take this additional instructions   levETIRAcetam 500 MG tablet Commonly known as: KEPPRA Take 2 tablets (1,000 mg total) by mouth 2 (two) times daily. What changed:  how much to take when to take this additional instructions    loperamide 2 MG tablet Commonly known as: IMODIUM A-D Take 2 mg by mouth 2 (two) times daily as needed for diarrhea or loose stools.   meclizine 25 MG tablet Commonly known as: ANTIVERT Take 25 mg by mouth 3 (three) times daily as needed for nausea.   metoCLOPramide 5 MG tablet Commonly known as: Reglan Take 1 tablet (5 mg total) by mouth 3 (three) times daily before meals.   mirtazapine 30 MG tablet Commonly known as: REMERON TAKE 1 TABLET (30 MG TOTAL) BY MOUTH AT BEDTIME.   naproxen 500 MG tablet Commonly known as: NAPROSYN Take 1 tablet (500 mg total) by mouth 2 (two) times daily as needed for moderate pain.   omeprazole 40 MG capsule Commonly known as: PRILOSEC Take 1 capsule (40 mg total) by mouth daily.   oxyCODONE-acetaminophen 5-325 MG tablet Commonly known as: PERCOCET/ROXICET Take 1-2 tablets by mouth every 8 (eight) hours as needed for severe pain.   sucralfate 1 g tablet Commonly known as: Carafate Take 1 tablet (1 g total) by mouth every 6 (six) hours as needed.        Disposition and follow-up:   Greg Morales was discharged from [facility] in Stable condition.  At the hospital follow up visit please address:  1.  Seizure disorder-Please ensure patient has access to these medications, they are affordable, and he is adherent. -Completed abilify 75m po daily x 2 days(09/18/2022), Will need to increase to abilify 113mpo daily for maintenance for next 6 days(last  day 09/24/2022). Patient can resume abilify maintenna injections on day 8(12/29). This injection is available at [facility].   Housing instabilityPolysubstance use disorderBPD-Patient would like to stop using alcohol, cocaine and opioids. He would like to go to rehab for these things. He will need follow up with his appointed lawyer and the judge to enter a rehab program after jail time. He will need continued follow up outpatient and a referral for outpatient social work. *HE SHOULD  HAVE SUICIDE PRECAUTIONS WHILE IN JAIL*. He will need to follow up with behavioral health once out of jail  2.  Labs / imaging needed at time of follow-up: lamotrigine level, keppra level  3.  Pending labs/ test needing follow-up: lamotrigine level  Follow-up Appointments:   Hospital Course by problem list: 1. Bipolar disorder Substance induced Mood disorder Suicidal Ideation. Psychiatry consulted given patients report of recent suicide attempt by fentanyl and thoughts of harming himself outside the hospital without a plan. All of this was in the context of housing instability, mood disorder, frequent incarceration, recent relationship loss with his children. Psychiatry contracted the patient for safety and did not feel he was appropriate for inpatient admission. They recommended suicide precautions at the jail. They provided recommendations for restarting his Abilify.     Seizure disorder The patient presented with c/f seizure vs opioid overdose vs malingering. This is most likely seizure given that EMS witnessed the event, he has a known history, reports tongue biting, and would not attribute to malingering given that there are other reasonable causes that make medical sense, and he did undergo some period of CPR which would not be tolerated in a conscious individual. Do not suspect opioid overdose as he has not been taking these the past 4 days at minimum. Do not suspect alcohol or benzo withdrawal given COWS score of 0 with scoring also done in jail, no benzo use on UDS, and also no reported alcohol or prior ED/hospital visit with elevated ethanol level. Additionally would not suspect withdrawal seizure from alcohol with no other signs of withdrawal. He has seen neurology in the past, last visit 03/2021.  It appears that he has been on lamotrigine and Keppra but states that he is not been taking these medications. I do suspect this was a seizure in the setting of medication non adherence which  directly stems from the patient's housing instability, mood disorder, substance use and frequent transition in and out of jail. Per neurology no EEG and will restarted on home meds including:Lamotrigine 150 mg twice daily, Keppra 1 g PO twice daily,Ativan 2 mg as needed for seizure abortion. He was also counseled on cocaine cessation.   Chest pain CPR The patient had CPR performed for 1 minute by EMS as they thought there was no pulse. The patient reports chest pain while laying in bed and pain with inspiration. On exam there is diffuse sternal and parasternal ptp, no palpable crepitus or stepoffs, no palpable bony deformity. CXR without evidence of fracture. Voltaren gel was used to help with pain.   Abdominal pain Nausea, vomiting, diarrhea Patient complains of lower quadrant abdominal pain, nausea, vomiting, and diarrhea with an unknown timeframe. He is unable to articulate the chronicity of these problems. On exam his abdomen is soft, non-distended, diffusely tender to palpation of the R and L LQ, no pain when pressing with the stethoscope and telling patient I am listening, no rebound or guarding, nor palpable massess or organomegaly. Per chart review appears these symptoms have been going on  for years and he has been following with  GI since 2021 and has had multiple negative EGDs and a negative colonoscopy. Also had a gastric emptying study with normal 2 hour studying but decreased emptying over time. Has not had opioids since being in jail the past 4 days and does not have dilated pupils or piloerection or yawning to suggest opioid withdrawal. No evidence of rhabdo given normal CK. Lipase wnl. Normal AST/ALT and alk phos/ bili with no concern for hepatobiliary process.Also doesn't seem like gastroenteritis given how long standing these general symptoms have been for years. He does have a history of marijuana hyperemesis but given no recent use I do not suspect this. These episodes also seem to  occur outside of seizures so I do not think they are aura like symptoms.  No further diagnostic studies were performed and symptoms were treated with Zofran.  Housing instabilityPolysubstance use disorder UDS positive for cocaine and THC.  Reports recent use of fentanyl.  Subjective Patient endorses that other than nausea and chest pain from his CPR nothing is bothering him. He is still hungry and says pain meds and antinausea meds help. He does not think the voltaren is helping much. When asked what still needs to be done for him he says "I dont know, you tell me". Discharge Exam:   BP 130/87 (BP Location: Right Arm)   Pulse 85   Temp 98.4 F (36.9 C) (Oral)   Resp 16   Ht _0  (1.676 m)   Wt 65.5 kg   SpO2 100%   BMI 23.31 kg/m  Discharge exam:  Gen:no acute distress, withdrawn, well nourished, well developed HEENT: poor dentition, no scleral icterus, EOMI CV:RRR, no m/r/g, normal s1/s2 2+ bilateral radial pulses Pulm: normal wob, LCTAB IZT:IWPY, non-distended , no pain when pressing with the stethoscope and telling patient I am listening, no rebound or guarding Extr: warm, dry, no LE edema MSK:ptp of the chest wall, voltaren gel on chest wall Psych:  flat affect, euthymic, some decreased engagement with answering questions, poor eye contact, normal rate and rhythm of speech, normal voice loudness Pertinent Labs, Studies, and Procedures:     Latest Ref Rng & Units 09/16/2022    2:47 PM 09/16/2022    2:06 PM 07/19/2022   11:32 PM  CBC  WBC 4.0 - 10.5 K/uL 6.2   11.0   Hemoglobin 13.0 - 17.0 g/dL 14.0  15.0  13.4   Hematocrit 39.0 - 52.0 % 42.8  44.0  41.6   Platelets 150 - 400 K/uL 348   391        Latest Ref Rng & Units 09/16/2022    2:06 PM 07/19/2022   11:32 PM 01/18/2022    1:15 PM  BMP  Glucose 70 - 99 mg/dL 110  109  89   BUN 6 - 20 mg/dL _1 Creatinine 0.61 - 1.24 mg/dL 0.80  1.23  1.04   Sodium 135 - 145 mmol/L 139  138  142   Potassium 3.5 - 5.1  mmol/L 3.9  3.5  3.0   Chloride 98 - 111 mmol/L 102  104  109   CO2 22 - 32 mmol/L  26  29   Calcium 8.9 - 10.3 mg/dL  8.9  8.9    DG Ribs Bilateral  Result Date: 09/17/2022 CLINICAL DATA:  Rib pain, seizures EXAM: BILATERAL RIBS - 3+ VIEW COMPARISON:  09/16/2022 FINDINGS: No displaced fracture or other bone lesions are  seen involving the ribs. IMPRESSION: No displaced fractures or other radiographic abnormality of the ribs to explain pain. Electronically Signed   By: Delanna Ahmadi M.D.   On: 09/17/2022 09:29   DG Chest Portable 1 View  Result Date: 09/16/2022 CLINICAL DATA:  Status post CPR EXAM: PORTABLE CHEST 1 VIEW COMPARISON:  CXR 01/18/22 FINDINGS: No pleural effusion. No pneumothorax. Normal cardiac and mediastinal contours. No displaced rib fractures. Focal airspace opacity. Visualized upper abdomen is unremarkable. IMPRESSION: No pleural effusion, pneumothorax, or focal airspace opacity. No displaced rib fracture. Electronically Signed   By: Marin Roberts M.D.   On: 09/16/2022 14:51   CT HEAD WO CONTRAST  Result Date: 09/16/2022 CLINICAL DATA:  Trauma EXAM: CT HEAD WITHOUT CONTRAST TECHNIQUE: Contiguous axial images were obtained from the base of the skull through the vertex without intravenous contrast. RADIATION DOSE REDUCTION: This exam was performed according to the departmental dose-optimization program which includes automated exposure control, adjustment of the mA and/or kV according to patient size and/or use of iterative reconstruction technique. COMPARISON:  01/18/2022 FINDINGS: Brain: No acute intracranial findings are seen. There are no signs of bleeding within the cranium. Minimal calcifications are seen in basal ganglia. Ventricles are not dilated. There is subtle decreased density in periventricular white matter. There is no focal edema or mass effect. Vascular: Unremarkable. Skull: No fracture is seen in calvarium. Sinuses/Orbits: Unremarkable. Other: None. IMPRESSION: No  acute intracranial findings are seen in noncontrast CT brain. Electronically Signed   By: Elmer Picker M.D.   On: 09/16/2022 14:30     Discharge Instructions: Discharge Instructions     Diet - low sodium heart healthy   Complete by: As directed    Increase activity slowly   Complete by: As directed      You were hospitalized for a seizure. You were evaluated by neurology and your home medications were started. You were also evaluated by psychiatry and your home abilify was restarted. You will need to talk with your lawyer and the judge about going to rehab once you leave jail. You should follow up with your primary care doctor to get a social work referral to continue to help with housing and resources for jobs, health insurance, substance use support, etc.  Signed: Iona Coach, MD 09/18/2022, 12:37 PM   Pager: [phone]

## 2022-09-18 NOTE — TOC CM/SW Note (Addendum)
Anticipate discharge today. [facility] transferred to medical no answer. Will try again later   1140 Called [facility]. Spoke to [place] to let her know plan is to discharge patient back today. She would like discharge summary ASAP to review . Once completed NCM will fax to her at [phone]  and then nurse to call report to intake at [phone] .  NCM secure chatted Dr Aundria Rud and nurse   [phone] Discharge summary faxed to  [phone]

## 2022-09-18 NOTE — Progress Notes (Signed)
Report called to intake at [facility]. AVS given to guard at bedside. Pt to be transported via [facility] with guards at side. He remains alert/oriented and stable at baseline. Printed prescription in AVS packet.

## 2022-09-22 ENCOUNTER — Telehealth: Payer: Self-pay | Admitting: Internal Medicine

## 2022-09-22 NOTE — Telephone Encounter (Signed)
Transition Care Management Unsuccessful Follow-up Telephone Call  Date of discharge and from where:  [facility] 09/17/22  Attempts:  1st Attempt  Reason for unsuccessful TCM follow-up call:  Missing or invalid number

## 2022-09-25 ENCOUNTER — Telehealth: Payer: Self-pay

## 2022-09-25 NOTE — Telephone Encounter (Signed)
Transition Care Management Unsuccessful Follow-up Telephone Call  Date of discharge and from where:    Attempts:  2nd Attempt  Reason for unsuccessful TCM follow-up call:  Left voice message    

## 2022-11-05 ENCOUNTER — Encounter (HOSPITAL_COMMUNITY): Payer: Self-pay

## 2022-11-05 ENCOUNTER — Emergency Department (HOSPITAL_COMMUNITY)
Admission: EM | Admit: 2022-11-05 | Discharge: 2022-11-05 | Attending: Emergency Medicine | Admitting: Emergency Medicine

## 2022-11-05 ENCOUNTER — Emergency Department (HOSPITAL_COMMUNITY)

## 2022-11-05 ENCOUNTER — Other Ambulatory Visit: Payer: Self-pay

## 2022-11-05 DIAGNOSIS — R569 Unspecified convulsions: Secondary | ICD-10-CM | POA: Insufficient documentation

## 2022-11-05 DIAGNOSIS — X58XXXA Exposure to other specified factors, initial encounter: Secondary | ICD-10-CM | POA: Diagnosis not present

## 2022-11-05 DIAGNOSIS — M542 Cervicalgia: Secondary | ICD-10-CM | POA: Insufficient documentation

## 2022-11-05 DIAGNOSIS — S0003XA Contusion of scalp, initial encounter: Secondary | ICD-10-CM | POA: Diagnosis not present

## 2022-11-05 LAB — CBC WITH DIFFERENTIAL/PLATELET
Abs Immature Granulocytes: 0 10*3/uL (ref 0.00–0.07)
Basophils Absolute: 0 10*3/uL (ref 0.0–0.1)
Basophils Relative: 1 %
Eosinophils Absolute: 0.3 10*3/uL (ref 0.0–0.5)
Eosinophils Relative: 6 %
HCT: 41.8 % (ref 39.0–52.0)
Hemoglobin: 13.1 g/dL (ref 13.0–17.0)
Immature Granulocytes: 0 %
Lymphocytes Relative: 27 %
Lymphs Abs: 1.7 10*3/uL (ref 0.7–4.0)
MCH: 30 pg (ref 26.0–34.0)
MCHC: 31.3 g/dL (ref 30.0–36.0)
MCV: 95.9 fL (ref 80.0–100.0)
Monocytes Absolute: 0.5 10*3/uL (ref 0.1–1.0)
Monocytes Relative: 8 %
Neutro Abs: 3.6 10*3/uL (ref 1.7–7.7)
Neutrophils Relative %: 58 %
Platelets: 285 10*3/uL (ref 150–400)
RBC: 4.36 MIL/uL (ref 4.22–5.81)
RDW: 12.8 % (ref 11.5–15.5)
WBC: 6.1 10*3/uL (ref 4.0–10.5)
nRBC: 0 % (ref 0.0–0.2)

## 2022-11-05 LAB — BASIC METABOLIC PANEL
Anion gap: 10 (ref 5–15)
BUN: 10 mg/dL (ref 6–20)
CO2: 24 mmol/L (ref 22–32)
Calcium: 9.3 mg/dL (ref 8.9–10.3)
Chloride: 105 mmol/L (ref 98–111)
Creatinine, Ser: 1.18 mg/dL (ref 0.61–1.24)
GFR, Estimated: >60 mL/min (ref >60)
Glucose, Bld: 85 mg/dL (ref 70–99)
Potassium: 4.3 mmol/L (ref 3.5–5.1)
Sodium: 139 mmol/L (ref 135–145)

## 2022-11-05 MED ORDER — LEVETIRACETAM IN NACL 1000 MG/100ML IV SOLN
1000.0000 mg | Freq: Once | INTRAVENOUS | Status: AC
  Administered 2022-11-05: 1000 mg via INTRAVENOUS
  Filled 2022-11-05: qty 100

## 2022-11-05 MED ORDER — LEVETIRACETAM 500 MG PO TABS: 1500.0000 mg | ORAL_TABLET | Freq: Two times a day (BID) | ORAL | 0 refills | Status: AC

## 2022-11-05 NOTE — Discharge Instructions (Signed)
Please take the Keppra 1500mg  twice daily for the next 30 days. ER for worsening symptoms.  Neurology this month for follow up.
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
## 2022-11-05 NOTE — ED Notes (Signed)
Pt arrived back from ct. C-collar applied per order
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
